# Patient Record
Sex: Female | Born: 1935 | Race: Black or African American | Hispanic: No | Smoking: Never smoker
Health system: Southern US, Community
[De-identification: ages and names within clinical notes are randomized; demographics above are authoritative.]

## PROBLEM LIST (undated history)

## (undated) DIAGNOSIS — E7800 Pure hypercholesterolemia, unspecified: Secondary | ICD-10-CM

## (undated) DIAGNOSIS — M1990 Unspecified osteoarthritis, unspecified site: Secondary | ICD-10-CM

## (undated) DIAGNOSIS — I639 Cerebral infarction, unspecified: Secondary | ICD-10-CM

## (undated) DIAGNOSIS — I10 Essential (primary) hypertension: Secondary | ICD-10-CM

## (undated) DIAGNOSIS — E871 Hypo-osmolality and hyponatremia: Secondary | ICD-10-CM

## (undated) HISTORY — PX: CATARACT EXTRACTION: SUR2

## (undated) HISTORY — PX: HERNIA REPAIR: SHX51

## (undated) HISTORY — PX: ABDOMINAL HERNIA REPAIR: SHX539

## (undated) HISTORY — PX: JOINT REPLACEMENT: SHX530

---

## 1998-04-30 ENCOUNTER — Other Ambulatory Visit: Admission: RE | Admit: 1998-04-30 | Discharge: 1998-04-30 | Payer: Self-pay | Admitting: Family Medicine

## 1998-06-23 ENCOUNTER — Ambulatory Visit (HOSPITAL_COMMUNITY): Admission: RE | Admit: 1998-06-23 | Discharge: 1998-06-24 | Payer: Self-pay

## 2002-01-18 ENCOUNTER — Encounter: Payer: Self-pay | Admitting: Pediatrics

## 2002-01-18 ENCOUNTER — Inpatient Hospital Stay (HOSPITAL_COMMUNITY): Admission: RE | Admit: 2002-01-18 | Discharge: 2002-01-20 | Payer: Self-pay | Admitting: *Deleted

## 2002-01-19 ENCOUNTER — Encounter: Payer: Self-pay | Admitting: Neurology

## 2011-01-09 ENCOUNTER — Emergency Department (HOSPITAL_COMMUNITY)
Admission: EM | Admit: 2011-01-09 | Discharge: 2011-01-09 | Disposition: A | Discharge status: Home / Self Care | Payer: Medicare Other | Attending: Emergency Medicine | Admitting: Emergency Medicine

## 2011-01-09 DIAGNOSIS — I10 Essential (primary) hypertension: Secondary | ICD-10-CM | POA: Insufficient documentation

## 2011-01-09 DIAGNOSIS — H9319 Tinnitus, unspecified ear: Secondary | ICD-10-CM | POA: Insufficient documentation

## 2011-01-09 DIAGNOSIS — E7800 Pure hypercholesterolemia, unspecified: Secondary | ICD-10-CM | POA: Insufficient documentation

## 2014-07-09 ENCOUNTER — Emergency Department (HOSPITAL_COMMUNITY): Payer: Medicare Other

## 2014-07-09 ENCOUNTER — Encounter (HOSPITAL_COMMUNITY): Payer: Self-pay | Admitting: Emergency Medicine

## 2014-07-09 ENCOUNTER — Inpatient Hospital Stay (HOSPITAL_COMMUNITY)
Admission: EM | Admit: 2014-07-09 | Discharge: 2014-07-12 | DRG: 469 | Disposition: A | Discharge status: Home Health | Payer: Medicare Other | Attending: Internal Medicine | Admitting: Internal Medicine

## 2014-07-09 DIAGNOSIS — Z820 Family history of epilepsy and other diseases of the nervous system: Secondary | ICD-10-CM

## 2014-07-09 DIAGNOSIS — D62 Acute posthemorrhagic anemia: Secondary | ICD-10-CM | POA: Diagnosis not present

## 2014-07-09 DIAGNOSIS — S72009A Fracture of unspecified part of neck of unspecified femur, initial encounter for closed fracture: Principal | ICD-10-CM | POA: Diagnosis present

## 2014-07-09 DIAGNOSIS — Z79899 Other long term (current) drug therapy: Secondary | ICD-10-CM

## 2014-07-09 DIAGNOSIS — M25559 Pain in unspecified hip: Secondary | ICD-10-CM | POA: Diagnosis not present

## 2014-07-09 DIAGNOSIS — I10 Essential (primary) hypertension: Secondary | ICD-10-CM | POA: Diagnosis present

## 2014-07-09 DIAGNOSIS — S72001A Fracture of unspecified part of neck of right femur, initial encounter for closed fracture: Secondary | ICD-10-CM

## 2014-07-09 DIAGNOSIS — Y9301 Activity, walking, marching and hiking: Secondary | ICD-10-CM

## 2014-07-09 DIAGNOSIS — Y92009 Unspecified place in unspecified non-institutional (private) residence as the place of occurrence of the external cause: Secondary | ICD-10-CM

## 2014-07-09 DIAGNOSIS — Z7901 Long term (current) use of anticoagulants: Secondary | ICD-10-CM

## 2014-07-09 DIAGNOSIS — E43 Unspecified severe protein-calorie malnutrition: Secondary | ICD-10-CM | POA: Insufficient documentation

## 2014-07-09 DIAGNOSIS — E785 Hyperlipidemia, unspecified: Secondary | ICD-10-CM | POA: Diagnosis present

## 2014-07-09 DIAGNOSIS — Z7982 Long term (current) use of aspirin: Secondary | ICD-10-CM

## 2014-07-09 DIAGNOSIS — W010XXA Fall on same level from slipping, tripping and stumbling without subsequent striking against object, initial encounter: Secondary | ICD-10-CM | POA: Diagnosis present

## 2014-07-09 HISTORY — DX: Essential (primary) hypertension: I10

## 2014-07-09 LAB — CBC WITH DIFFERENTIAL/PLATELET
BASOS PCT: 1 % (ref 0–1)
Basophils Absolute: 0 10*3/uL (ref 0.0–0.1)
Eosinophils Absolute: 0.4 10*3/uL (ref 0.0–0.7)
Eosinophils Relative: 5 % (ref 0–5)
HEMATOCRIT: 35.7 % — AB (ref 36.0–46.0)
HEMOGLOBIN: 11.9 g/dL — AB (ref 12.0–15.0)
LYMPHS ABS: 1.7 10*3/uL (ref 0.7–4.0)
LYMPHS PCT: 19 % (ref 12–46)
MCH: 27.4 pg (ref 26.0–34.0)
MCHC: 33.3 g/dL (ref 30.0–36.0)
MCV: 82.3 fL (ref 78.0–100.0)
MONO ABS: 1 10*3/uL (ref 0.1–1.0)
MONOS PCT: 12 % (ref 3–12)
NEUTROS PCT: 63 % (ref 43–77)
Neutro Abs: 5.7 10*3/uL (ref 1.7–7.7)
Platelets: 378 10*3/uL (ref 150–400)
RBC: 4.34 MIL/uL (ref 3.87–5.11)
RDW: 13.7 % (ref 11.5–15.5)
WBC: 8.8 10*3/uL (ref 4.0–10.5)

## 2014-07-09 LAB — TYPE AND SCREEN
ABO/RH(D): A POS
Antibody Screen: NEGATIVE

## 2014-07-09 LAB — BASIC METABOLIC PANEL
Anion gap: 11 (ref 5–15)
BUN: 7 mg/dL (ref 6–23)
CO2: 27 meq/L (ref 19–32)
CREATININE: 0.6 mg/dL (ref 0.50–1.10)
Calcium: 8.5 mg/dL (ref 8.4–10.5)
Chloride: 97 mEq/L (ref 96–112)
GFR calc Af Amer: 89 mL/min — ABNORMAL LOW (ref >90)
GFR calc non Af Amer: 77 mL/min — ABNORMAL LOW (ref >90)
Glucose, Bld: 114 mg/dL — ABNORMAL HIGH (ref 70–99)
POTASSIUM: 3.7 meq/L (ref 3.7–5.3)
Sodium: 135 mEq/L — ABNORMAL LOW (ref 137–147)

## 2014-07-09 LAB — PROTIME-INR
INR: 1.33 (ref 0.00–1.49)
Prothrombin Time: 16.5 seconds — ABNORMAL HIGH (ref 11.6–15.2)

## 2014-07-09 MED ORDER — FENTANYL CITRATE 0.05 MG/ML IJ SOLN: 50.0000 ug | INTRAMUSCULAR | Status: DC | PRN

## 2014-07-09 NOTE — ED Notes (Signed)
The pt arrived by [facility]  From home where she fell getting out of her car/  Alert oriented skin warm and dry c/o rt hip pain

## 2014-07-09 NOTE — ED Notes (Signed)
The pt is c/o pain in her rt hip with movement.  She lives with her daughter

## 2014-07-09 NOTE — ED Provider Notes (Addendum)
CSN: [id number]     Arrival date & time 07/09/14  2214 History   First MD Initiated Contact with Patient 07/09/14 2216     Chief Complaint  Patient presents with  . Fall     (Consider location/radiation/quality/duration/timing/severity/associated sxs/prior Treatment) Patient is a 80 y.o. female presenting with hip pain. The history is provided by the patient. No language interpreter was used.  Hip Pain This is a new problem. The current episode started 1 to 2 hours ago. The problem occurs constantly. The problem has not changed since onset.Pertinent negatives include no chest pain, no abdominal pain, no headaches and no shortness of breath. The symptoms are aggravated by walking. The symptoms are relieved by rest. She has tried rest for the symptoms. The treatment provided mild relief.    Past Medical History  Diagnosis Date  . Hypertension    History reviewed. No pertinent past surgical history. No family history on file. History  Substance Use Topics  . Smoking status: Never Smoker   . Smokeless tobacco: Not on file  . Alcohol Use: No   OB History   Grav Para Term Preterm Abortions TAB SAB Ect Mult Living                 Review of Systems  Constitutional: Negative for fever, chills, diaphoresis, activity change, appetite change and fatigue.  HENT: Negative for congestion, facial swelling, rhinorrhea and sore throat.   Eyes: Negative for photophobia and discharge.  Respiratory: Negative for cough, chest tightness and shortness of breath.   Cardiovascular: Negative for chest pain, palpitations and leg swelling.  Gastrointestinal: Negative for nausea, vomiting, abdominal pain and diarrhea.  Endocrine: Negative for polydipsia and polyuria.  Genitourinary: Negative for dysuria, frequency, difficulty urinating and pelvic pain.  Musculoskeletal: Negative for arthralgias, back pain, neck pain and neck stiffness.  Skin: Negative for color change and wound.  Allergic/Immunologic:  Negative for immunocompromised state.  Neurological: Negative for facial asymmetry, weakness, numbness and headaches.  Hematological: Does not bruise/bleed easily.  Psychiatric/Behavioral: Negative for confusion and agitation.      Allergies  Review of patient's allergies indicates no known allergies.  Home Medications   Prior to Admission medications   Medication Sig Start Date End Date Taking? Authorizing Provider  aspirin 81 MG chewable tablet Chew 81 mg by mouth daily.   Yes Historical Provider, MD  furosemide (LASIX) 20 MG tablet Take 20 mg by mouth daily.   Yes Historical Provider, MD  hydrALAZINE (APRESOLINE) 50 MG tablet Take 50 mg by mouth daily.   Yes Historical Provider, MD  loratadine (CLARITIN) 10 MG tablet Take 10 mg by mouth daily.   Yes Historical Provider, MD  metoprolol (TOPROL-XL) 200 MG 24 hr tablet Take 200 mg by mouth daily.   Yes Historical Provider, MD  potassium chloride SA (K-DUR,KLOR-CON) 20 MEQ tablet Take 20 mEq by mouth daily.    Yes Historical Provider, MD  pravastatin (PRAVACHOL) 40 MG tablet Take 40 mg by mouth daily.   Yes Historical Provider, MD   BP 198/98  Pulse 78  Temp(Src) 98.1 F (36.7 C) (Oral)  Resp 20  SpO2 98% Physical Exam  Constitutional: She is oriented to person, place, and time. She appears well-developed and well-nourished. No distress.  HENT:  Head: Normocephalic and atraumatic.  Mouth/Throat: No oropharyngeal exudate.  Eyes: Pupils are equal, round, and reactive to light.  Neck: Normal range of motion. Neck supple.  Cardiovascular: Normal rate, regular rhythm and normal heart sounds.  Exam reveals  no gallop and no friction rub.   No murmur heard. Pulmonary/Chest: Effort normal and breath sounds normal. No respiratory distress. She has no wheezes. She has no rales.  Abdominal: Soft. Bowel sounds are normal. She exhibits no distension and no mass. There is no tenderness. There is no rebound and no guarding.  Musculoskeletal:  Normal range of motion. She exhibits no edema.       Right hip: She exhibits tenderness and bony tenderness.  R leg shortened and externally rotated. NVI distally.   Neurological: She is alert and oriented to person, place, and time.  Skin: Skin is warm and dry.  Psychiatric: She has a normal mood and affect.    ED Course  Procedures (including critical care time) Labs Review Labs Reviewed  BASIC METABOLIC PANEL - Abnormal; Notable for the following:    Sodium 135 (*)    Glucose, Bld 114 (*)    GFR calc non Af Amer 77 (*)    GFR calc Af Amer 89 (*)    All other components within normal limits  CBC WITH DIFFERENTIAL - Abnormal; Notable for the following:    Hemoglobin 11.9 (*)    HCT 35.7 (*)    All other components within normal limits  PROTIME-INR - Abnormal; Notable for the following:    Prothrombin Time 16.5 (*)    All other components within normal limits  URINE CULTURE  URINALYSIS, ROUTINE W REFLEX MICROSCOPIC  TYPE AND SCREEN  ABO/RH    Imaging Review Dg Hip Complete Right  07/09/2014   CLINICAL DATA:  Fall, right hip pain.  EXAM: RIGHT HIP - COMPLETE 2+ VIEW  COMPARISON:  None.  FINDINGS: Right femoral neck fracture with impaction, mild varus angulation of the distal bony fragments. Femoral heads are located.  Bone mineral density is decreased without destructive bony lesions. Phleboliths in the pelvis. Periarticular soft tissue planes are nonsuspicious.  IMPRESSION: Impacted mildly angulated right femoral neck fracture without dislocation.   Electronically Signed   By: Awilda Metro   On: 07/09/2014 23:58     EKG Interpretation None      MDM   Final diagnoses:  Femoral neck fracture, right, closed, initial encounter    Pt is a 80 y.o. female with Pmhx as above who presents with R hip pain after mechanical fall this evening. No head injury, no LOC, no syncope.  NVI distally, though R leg is shortened and externally rotated. XR confirms R femoral neck  fracture. Hip fracture protocol initiated. Ortho consulted, plan is for repair tomorrow afternoon or evening. [facility] will admit.         Toy Cookey, MD 07/10/14 1138  Toy Cookey, MD 07/10/14 1141

## 2014-07-10 ENCOUNTER — Inpatient Hospital Stay (HOSPITAL_COMMUNITY): Payer: Medicare Other | Admitting: Anesthesiology

## 2014-07-10 ENCOUNTER — Inpatient Hospital Stay (HOSPITAL_COMMUNITY): Payer: Medicare Other

## 2014-07-10 ENCOUNTER — Encounter (HOSPITAL_COMMUNITY): Payer: Medicare Other | Admitting: Anesthesiology

## 2014-07-10 ENCOUNTER — Encounter (HOSPITAL_COMMUNITY): Payer: Self-pay | Admitting: *Deleted

## 2014-07-10 ENCOUNTER — Encounter (HOSPITAL_COMMUNITY)
Admission: EM | Disposition: A | Discharge status: Home Health | Payer: Self-pay | Source: Home / Self Care | Attending: Internal Medicine

## 2014-07-10 DIAGNOSIS — I10 Essential (primary) hypertension: Secondary | ICD-10-CM | POA: Diagnosis present

## 2014-07-10 DIAGNOSIS — D62 Acute posthemorrhagic anemia: Secondary | ICD-10-CM | POA: Diagnosis not present

## 2014-07-10 DIAGNOSIS — Y9301 Activity, walking, marching and hiking: Secondary | ICD-10-CM | POA: Diagnosis not present

## 2014-07-10 DIAGNOSIS — Y92009 Unspecified place in unspecified non-institutional (private) residence as the place of occurrence of the external cause: Secondary | ICD-10-CM | POA: Diagnosis not present

## 2014-07-10 DIAGNOSIS — S72001A Fracture of unspecified part of neck of right femur, initial encounter for closed fracture: Secondary | ICD-10-CM | POA: Diagnosis present

## 2014-07-10 DIAGNOSIS — S72009A Fracture of unspecified part of neck of unspecified femur, initial encounter for closed fracture: Principal | ICD-10-CM

## 2014-07-10 DIAGNOSIS — Z7982 Long term (current) use of aspirin: Secondary | ICD-10-CM | POA: Diagnosis not present

## 2014-07-10 DIAGNOSIS — Z7901 Long term (current) use of anticoagulants: Secondary | ICD-10-CM | POA: Diagnosis not present

## 2014-07-10 DIAGNOSIS — E785 Hyperlipidemia, unspecified: Secondary | ICD-10-CM

## 2014-07-10 DIAGNOSIS — Z79899 Other long term (current) drug therapy: Secondary | ICD-10-CM | POA: Diagnosis not present

## 2014-07-10 DIAGNOSIS — W010XXA Fall on same level from slipping, tripping and stumbling without subsequent striking against object, initial encounter: Secondary | ICD-10-CM | POA: Diagnosis present

## 2014-07-10 DIAGNOSIS — E43 Unspecified severe protein-calorie malnutrition: Secondary | ICD-10-CM | POA: Diagnosis present

## 2014-07-10 DIAGNOSIS — Z820 Family history of epilepsy and other diseases of the nervous system: Secondary | ICD-10-CM | POA: Diagnosis not present

## 2014-07-10 DIAGNOSIS — M25559 Pain in unspecified hip: Secondary | ICD-10-CM | POA: Diagnosis present

## 2014-07-10 HISTORY — PX: HIP ARTHROPLASTY: SHX981

## 2014-07-10 LAB — CBC WITH DIFFERENTIAL/PLATELET
Basophils Absolute: 0 10*3/uL (ref 0.0–0.1)
Basophils Relative: 0 % (ref 0–1)
EOS ABS: 0 10*3/uL (ref 0.0–0.7)
Eosinophils Relative: 0 % (ref 0–5)
HCT: 37.7 % (ref 36.0–46.0)
Hemoglobin: 12.4 g/dL (ref 12.0–15.0)
LYMPHS ABS: 1.2 10*3/uL (ref 0.7–4.0)
LYMPHS PCT: 11 % — AB (ref 12–46)
MCH: 27.6 pg (ref 26.0–34.0)
MCHC: 32.9 g/dL (ref 30.0–36.0)
MCV: 84 fL (ref 78.0–100.0)
Monocytes Absolute: 0.7 10*3/uL (ref 0.1–1.0)
Monocytes Relative: 6 % (ref 3–12)
NEUTROS PCT: 83 % — AB (ref 43–77)
Neutro Abs: 8.6 10*3/uL — ABNORMAL HIGH (ref 1.7–7.7)
Platelets: 390 10*3/uL (ref 150–400)
RBC: 4.49 MIL/uL (ref 3.87–5.11)
RDW: 13.8 % (ref 11.5–15.5)
WBC: 10.5 10*3/uL (ref 4.0–10.5)

## 2014-07-10 LAB — URINALYSIS, ROUTINE W REFLEX MICROSCOPIC
BILIRUBIN URINE: NEGATIVE
Glucose, UA: NEGATIVE mg/dL
Hgb urine dipstick: NEGATIVE
KETONES UR: NEGATIVE mg/dL
Leukocytes, UA: NEGATIVE
Nitrite: NEGATIVE
PH: 8 (ref 5.0–8.0)
Protein, ur: NEGATIVE mg/dL
Specific Gravity, Urine: 1.01 (ref 1.005–1.030)
Urobilinogen, UA: 0.2 mg/dL (ref 0.0–1.0)

## 2014-07-10 LAB — COMPREHENSIVE METABOLIC PANEL
ALT: 6 U/L (ref 0–35)
AST: 18 U/L (ref 0–37)
Albumin: 2.5 g/dL — ABNORMAL LOW (ref 3.5–5.2)
Alkaline Phosphatase: 85 U/L (ref 39–117)
Anion gap: 13 (ref 5–15)
BUN: 7 mg/dL (ref 6–23)
CALCIUM: 8.8 mg/dL (ref 8.4–10.5)
CO2: 27 meq/L (ref 19–32)
Chloride: 97 mEq/L (ref 96–112)
Creatinine, Ser: 0.52 mg/dL (ref 0.50–1.10)
GFR, EST NON AFRICAN AMERICAN: 80 mL/min — AB (ref >90)
GLUCOSE: 126 mg/dL — AB (ref 70–99)
Potassium: 3.7 mEq/L (ref 3.7–5.3)
Sodium: 137 mEq/L (ref 137–147)
TOTAL PROTEIN: 7.5 g/dL (ref 6.0–8.3)
Total Bilirubin: 0.8 mg/dL (ref 0.3–1.2)

## 2014-07-10 LAB — ABO/RH: ABO/RH(D): A POS

## 2014-07-10 LAB — SURGICAL PCR SCREEN
MRSA, PCR: NEGATIVE
Staphylococcus aureus: NEGATIVE

## 2014-07-10 SURGERY — HEMIARTHROPLASTY, HIP, DIRECT ANTERIOR APPROACH, FOR FRACTURE
Anesthesia: General | Site: Hip | Laterality: Right

## 2014-07-10 MED ORDER — HYDROMORPHONE HCL PF 1 MG/ML IJ SOLN: 0.2500 mg | INTRAMUSCULAR | Status: DC | PRN

## 2014-07-10 MED ORDER — SODIUM CHLORIDE 0.9 % IV SOLN
INTRAVENOUS | Status: DC
  Administered 2014-07-10: 21:00:00 via INTRAVENOUS
  Administered 2014-07-11: 125 mL/h via INTRAVENOUS

## 2014-07-10 MED ORDER — METOCLOPRAMIDE HCL 10 MG PO TABS: 5.0000 mg | ORAL_TABLET | Freq: Three times a day (TID) | ORAL | Status: DC | PRN

## 2014-07-10 MED ORDER — MENTHOL 3 MG MT LOZG: 1.0000 | LOZENGE | OROMUCOSAL | Status: DC | PRN

## 2014-07-10 MED ORDER — HYDROCODONE-ACETAMINOPHEN 5-325 MG PO TABS: 1.0000 | ORAL_TABLET | Freq: Four times a day (QID) | ORAL | Status: DC | PRN

## 2014-07-10 MED ORDER — MIDAZOLAM HCL 2 MG/2ML IJ SOLN
INTRAMUSCULAR | Status: AC
  Filled 2014-07-10: qty 2

## 2014-07-10 MED ORDER — PROMETHAZINE HCL 25 MG/ML IJ SOLN: 6.2500 mg | INTRAMUSCULAR | Status: DC | PRN

## 2014-07-10 MED ORDER — FENTANYL CITRATE 0.05 MG/ML IJ SOLN
INTRAMUSCULAR | Status: AC
  Filled 2014-07-10: qty 5

## 2014-07-10 MED ORDER — SODIUM CHLORIDE 0.9 % IR SOLN
Status: DC | PRN
  Administered 2014-07-10: 1000 mL

## 2014-07-10 MED ORDER — SODIUM CHLORIDE 0.9 % IV SOLN
INTRAVENOUS | Status: DC
  Administered 2014-07-10: 10 mL via INTRAVENOUS

## 2014-07-10 MED ORDER — SODIUM CHLORIDE 0.9 % IR SOLN
Status: DC | PRN
  Administered 2014-07-10: 3000 mL

## 2014-07-10 MED ORDER — METOCLOPRAMIDE HCL 5 MG/ML IJ SOLN: 5.0000 mg | Freq: Three times a day (TID) | INTRAMUSCULAR | Status: DC | PRN

## 2014-07-10 MED ORDER — ACETAMINOPHEN 650 MG RE SUPP: 650.0000 mg | Freq: Four times a day (QID) | RECTAL | Status: DC | PRN

## 2014-07-10 MED ORDER — ACETAMINOPHEN 325 MG PO TABS
650.0000 mg | ORAL_TABLET | Freq: Four times a day (QID) | ORAL | Status: DC | PRN
  Administered 2014-07-12: 650 mg via ORAL
  Filled 2014-07-10: qty 2

## 2014-07-10 MED ORDER — MAGNESIUM CITRATE PO SOLN: 1.0000 | Freq: Once | ORAL | Status: AC | PRN

## 2014-07-10 MED ORDER — ENOXAPARIN SODIUM 40 MG/0.4ML ~~LOC~~ SOLN
40.0000 mg | SUBCUTANEOUS | Status: DC
  Administered 2014-07-11 – 2014-07-12 (×2): 40 mg via SUBCUTANEOUS
  Filled 2014-07-10 (×4): qty 0.4

## 2014-07-10 MED ORDER — MORPHINE SULFATE 2 MG/ML IJ SOLN
0.5000 mg | INTRAMUSCULAR | Status: DC | PRN
Start: 2014-07-10 — End: 2014-07-12
  Administered 2014-07-10 – 2014-07-11 (×4): 0.5 mg via INTRAVENOUS
  Filled 2014-07-10 (×5): qty 1

## 2014-07-10 MED ORDER — PHENYLEPHRINE HCL 10 MG/ML IJ SOLN
INTRAMUSCULAR | Status: AC
Start: 2014-07-10 — End: 2014-07-10
  Filled 2014-07-10: qty 1

## 2014-07-10 MED ORDER — POLYETHYLENE GLYCOL 3350 17 G PO PACK: 17.0000 g | PACK | Freq: Every day | ORAL | Status: DC | PRN

## 2014-07-10 MED ORDER — BUPIVACAINE HCL (PF) 0.5 % IJ SOLN
INTRAMUSCULAR | Status: DC | PRN
  Administered 2014-07-10: 2.5 mL

## 2014-07-10 MED ORDER — LIDOCAINE HCL (CARDIAC) 20 MG/ML IV SOLN
INTRAVENOUS | Status: AC
  Filled 2014-07-10: qty 5

## 2014-07-10 MED ORDER — FENTANYL CITRATE 0.05 MG/ML IJ SOLN: 25.0000 ug | INTRAMUSCULAR | Status: DC | PRN

## 2014-07-10 MED ORDER — HYDROMORPHONE HCL PF 1 MG/ML IJ SOLN
0.5000 mg | Freq: Once | INTRAMUSCULAR | Status: AC
  Administered 2014-07-10: 0.5 mg via INTRAVENOUS
  Filled 2014-07-10: qty 1

## 2014-07-10 MED ORDER — DEXTROSE 5 % IV SOLN
INTRAVENOUS | Status: DC | PRN
  Administered 2014-07-10: 15:00:00 via INTRAVENOUS

## 2014-07-10 MED ORDER — METHOCARBAMOL 500 MG PO TABS: 500.0000 mg | ORAL_TABLET | Freq: Four times a day (QID) | ORAL | Status: DC | PRN

## 2014-07-10 MED ORDER — PNEUMOCOCCAL VAC POLYVALENT 25 MCG/0.5ML IJ INJ
0.5000 mL | INJECTION | INTRAMUSCULAR | Status: DC
  Filled 2014-07-10: qty 0.5

## 2014-07-10 MED ORDER — HYDROCODONE-ACETAMINOPHEN 5-325 MG PO TABS
1.0000 | ORAL_TABLET | Freq: Four times a day (QID) | ORAL | Status: DC | PRN
  Filled 2014-07-10 (×3): qty 1

## 2014-07-10 MED ORDER — SODIUM CHLORIDE 0.9 % IV SOLN: INTRAVENOUS | Status: DC

## 2014-07-10 MED ORDER — PROPOFOL 10 MG/ML IV BOLUS
INTRAVENOUS | Status: DC | PRN
  Administered 2014-07-10: 40 mg via INTRAVENOUS

## 2014-07-10 MED ORDER — METOPROLOL SUCCINATE ER 100 MG PO TB24
100.0000 mg | ORAL_TABLET | Freq: Every day | ORAL | Status: DC
  Administered 2014-07-11 – 2014-07-12 (×2): 100 mg via ORAL
  Filled 2014-07-10 (×3): qty 1

## 2014-07-10 MED ORDER — CEFAZOLIN SODIUM-DEXTROSE 2-3 GM-% IV SOLR
2.0000 g | INTRAVENOUS | Status: AC
  Administered 2014-07-10: 2 g via INTRAVENOUS
  Filled 2014-07-10: qty 50

## 2014-07-10 MED ORDER — HYDRALAZINE HCL 20 MG/ML IJ SOLN
10.0000 mg | INTRAMUSCULAR | Status: DC | PRN
  Administered 2014-07-10: 10 mg via INTRAVENOUS
  Filled 2014-07-10 (×2): qty 1

## 2014-07-10 MED ORDER — PROPOFOL 10 MG/ML IV BOLUS
INTRAVENOUS | Status: AC
  Filled 2014-07-10: qty 20

## 2014-07-10 MED ORDER — CHLORHEXIDINE GLUCONATE 4 % EX LIQD
60.0000 mL | Freq: Once | CUTANEOUS | Status: AC
  Administered 2014-07-10: 4 via TOPICAL
  Filled 2014-07-10: qty 60

## 2014-07-10 MED ORDER — PROMETHAZINE HCL 25 MG PO TABS: 25.0000 mg | ORAL_TABLET | Freq: Four times a day (QID) | ORAL | Status: DC | PRN

## 2014-07-10 MED ORDER — ONDANSETRON HCL 4 MG PO TABS: 4.0000 mg | ORAL_TABLET | Freq: Four times a day (QID) | ORAL | Status: DC | PRN

## 2014-07-10 MED ORDER — ENOXAPARIN SODIUM 40 MG/0.4ML ~~LOC~~ SOLN: 40.0000 mg | Freq: Every day | SUBCUTANEOUS | Status: DC

## 2014-07-10 MED ORDER — LACTATED RINGERS IV SOLN
INTRAVENOUS | Status: DC
  Administered 2014-07-10: 13:00:00 via INTRAVENOUS

## 2014-07-10 MED ORDER — PHENYLEPHRINE 40 MCG/ML (10ML) SYRINGE FOR IV PUSH (FOR BLOOD PRESSURE SUPPORT)
PREFILLED_SYRINGE | INTRAVENOUS | Status: AC
  Filled 2014-07-10: qty 10

## 2014-07-10 MED ORDER — METHOCARBAMOL 1000 MG/10ML IJ SOLN
500.0000 mg | Freq: Four times a day (QID) | INTRAVENOUS | Status: DC | PRN
  Filled 2014-07-10: qty 5

## 2014-07-10 MED ORDER — SORBITOL 70 % SOLN
30.0000 mL | Freq: Every day | Status: DC | PRN
Start: 2014-07-10 — End: 2014-07-12

## 2014-07-10 MED ORDER — HYDROCODONE-ACETAMINOPHEN 5-325 MG PO TABS
1.0000 | ORAL_TABLET | Freq: Four times a day (QID) | ORAL | Status: DC | PRN
  Administered 2014-07-11 – 2014-07-12 (×4): 1 via ORAL
  Filled 2014-07-10 (×2): qty 1

## 2014-07-10 MED ORDER — SENNOSIDES-DOCUSATE SODIUM 8.6-50 MG PO TABS: 1.0000 | ORAL_TABLET | Freq: Every evening | ORAL | Status: DC | PRN

## 2014-07-10 MED ORDER — PHENYLEPHRINE HCL 10 MG/ML IJ SOLN
INTRAMUSCULAR | Status: DC | PRN
  Administered 2014-07-10: 80 ug via INTRAVENOUS
  Administered 2014-07-10 (×2): 40 ug via INTRAVENOUS
  Administered 2014-07-10 (×3): 80 ug via INTRAVENOUS

## 2014-07-10 MED ORDER — OXYCODONE HCL 5 MG PO TABS: 5.0000 mg | ORAL_TABLET | Freq: Once | ORAL | Status: DC | PRN

## 2014-07-10 MED ORDER — ONDANSETRON HCL 4 MG/2ML IJ SOLN: 4.0000 mg | Freq: Once | INTRAMUSCULAR | Status: DC | PRN

## 2014-07-10 MED ORDER — OXYCODONE HCL 5 MG/5ML PO SOLN: 5.0000 mg | Freq: Once | ORAL | Status: DC | PRN

## 2014-07-10 MED ORDER — CEFAZOLIN SODIUM-DEXTROSE 2-3 GM-% IV SOLR
2.0000 g | Freq: Four times a day (QID) | INTRAVENOUS | Status: AC
  Administered 2014-07-10 – 2014-07-11 (×3): 2 g via INTRAVENOUS
  Filled 2014-07-10 (×3): qty 50

## 2014-07-10 MED ORDER — FENTANYL CITRATE 0.05 MG/ML IJ SOLN
INTRAMUSCULAR | Status: DC | PRN
Start: 2014-07-10 — End: 2014-07-10
  Administered 2014-07-10: 50 ug via INTRAVENOUS

## 2014-07-10 MED ORDER — OXYCODONE HCL 5 MG PO TABS: 5.0000 mg | ORAL_TABLET | ORAL | Status: DC | PRN

## 2014-07-10 MED ORDER — MORPHINE SULFATE 2 MG/ML IJ SOLN
0.5000 mg | INTRAMUSCULAR | Status: DC | PRN
  Administered 2014-07-11 (×2): 0.5 mg via INTRAVENOUS
  Filled 2014-07-10: qty 1

## 2014-07-10 MED ORDER — METOPROLOL TARTRATE 1 MG/ML IV SOLN
5.0000 mg | Freq: Four times a day (QID) | INTRAVENOUS | Status: DC
  Administered 2014-07-10 – 2014-07-11 (×4): 5 mg via INTRAVENOUS
  Filled 2014-07-10 (×11): qty 5

## 2014-07-10 MED ORDER — FUROSEMIDE 20 MG PO TABS
20.0000 mg | ORAL_TABLET | Freq: Every day | ORAL | Status: DC
  Administered 2014-07-11 – 2014-07-12 (×2): 20 mg via ORAL
  Filled 2014-07-10 (×3): qty 1

## 2014-07-10 MED ORDER — PHENOL 1.4 % MT LIQD: 1.0000 | OROMUCOSAL | Status: DC | PRN

## 2014-07-10 MED ORDER — SENNA 8.6 MG PO TABS
1.0000 | ORAL_TABLET | Freq: Two times a day (BID) | ORAL | Status: DC
  Administered 2014-07-11 – 2014-07-12 (×3): 8.6 mg via ORAL
  Filled 2014-07-10 (×5): qty 1

## 2014-07-10 MED ORDER — CALCIUM CARBONATE-VITAMIN D 500-200 MG-UNIT PO TABS: 1.0000 | ORAL_TABLET | Freq: Every day | ORAL | Status: DC

## 2014-07-10 MED ORDER — METOPROLOL SUCCINATE ER 100 MG PO TB24: 100.0000 mg | ORAL_TABLET | Freq: Every day | ORAL | Status: DC

## 2014-07-10 MED ORDER — MEPERIDINE HCL 25 MG/ML IJ SOLN: 6.2500 mg | INTRAMUSCULAR | Status: DC | PRN

## 2014-07-10 MED ORDER — ALUM & MAG HYDROXIDE-SIMETH 200-200-20 MG/5ML PO SUSP: 30.0000 mL | ORAL | Status: DC | PRN

## 2014-07-10 MED ORDER — LACTATED RINGERS IV SOLN
INTRAVENOUS | Status: DC | PRN
  Administered 2014-07-10 (×2): via INTRAVENOUS

## 2014-07-10 MED ORDER — ONDANSETRON HCL 4 MG/2ML IJ SOLN: 4.0000 mg | Freq: Four times a day (QID) | INTRAMUSCULAR | Status: DC | PRN

## 2014-07-10 SURGICAL SUPPLY — 42 items
BLADE SAGITTAL (BLADE) ×3
BLADE SAW THK.89X75X18XSGTL (BLADE) ×1 IMPLANT
CEMENT ODC PLUS STEM (Cement) ×2 IMPLANT
COVER SURGICAL LIGHT HANDLE (MISCELLANEOUS) ×3 IMPLANT
DRAPE HIP W/POCKET STRL (DRAPE) ×3 IMPLANT
DRAPE INCISE IOBAN 85X60 (DRAPES) ×3 IMPLANT
DRAPE U-SHAPE 47X51 STRL (DRAPES) ×3 IMPLANT
DRSG AQUACEL AG ADV 3.5X10 (GAUZE/BANDAGES/DRESSINGS) ×2 IMPLANT
DRSG MEPILEX BORDER 4X8 (GAUZE/BANDAGES/DRESSINGS) IMPLANT
DURAPREP 26ML APPLICATOR (WOUND CARE) ×6 IMPLANT
ELECT CAUTERY BLADE 6.4 (BLADE) ×3 IMPLANT
ELECT REM PT RETURN 9FT ADLT (ELECTROSURGICAL) ×3
ELECTRODE REM PT RTRN 9FT ADLT (ELECTROSURGICAL) ×1 IMPLANT
EVACUATOR 1/8 PVC DRAIN (DRAIN) IMPLANT
FACESHIELD WRAPAROUND (MASK) ×6 IMPLANT
FACESHIELD WRAPAROUND OR TEAM (MASK) IMPLANT
GLOVE SURG SYN 7.5  E (GLOVE) ×4
GLOVE SURG SYN 7.5 E (GLOVE) ×2 IMPLANT
GLOVE SURG SYN 7.5 PF PI (GLOVE) ×2 IMPLANT
GOWN STRL REUS W/ TWL LRG LVL3 (GOWN DISPOSABLE) ×1 IMPLANT
GOWN STRL REUS W/TWL LRG LVL3 (GOWN DISPOSABLE) ×3
HIP HEMIARTHROPLASTY LEV 1B ×2 IMPLANT
KIT BASIN OR (CUSTOM PROCEDURE TRAY) ×3 IMPLANT
KIT ROOM TURNOVER OR (KITS) ×3 IMPLANT
MANIFOLD NEPTUNE II (INSTRUMENTS) ×3 IMPLANT
NS IRRIG 1000ML POUR BTL (IV SOLUTION) ×3 IMPLANT
PACK SHOULDER (CUSTOM PROCEDURE TRAY) ×3 IMPLANT
PAD ARMBOARD 7.5X6 YLW CONV (MISCELLANEOUS) ×6 IMPLANT
SPONGE LAP 18X18 X RAY DECT (DISPOSABLE) ×4 IMPLANT
STAPLER VISISTAT 35W (STAPLE) IMPLANT
SUT ETHIBOND 2 V 37 (SUTURE) ×3 IMPLANT
SUT ETHIBOND CT1 BRD #0 30IN (SUTURE) ×2 IMPLANT
SUT ETHIBOND NAB CT1 #1 30IN (SUTURE) ×2 IMPLANT
SUT PDS AB 1 CT  36 (SUTURE) ×6
SUT PDS AB 1 CT 36 (SUTURE) ×2 IMPLANT
SUT VIC AB 0 CT1 27 (SUTURE)
SUT VIC AB 0 CT1 27XBRD ANBCTR (SUTURE) IMPLANT
SUT VIC AB 1 CTB1 27 (SUTURE) IMPLANT
SUT VIC AB 2-0 CT1 27 (SUTURE)
SUT VIC AB 2-0 CT1 TAPERPNT 27 (SUTURE) IMPLANT
TOWEL OR 17X24 6PK STRL BLUE (TOWEL DISPOSABLE) ×3 IMPLANT
TOWEL OR 17X26 10 PK STRL BLUE (TOWEL DISPOSABLE) ×3 IMPLANT

## 2014-07-10 NOTE — H&P (Signed)
[facility] History and Physical  [name] Fry ZOX:[id number] DOB: 03/05/1920 DOA: 07/09/2014  Referring physician: ER physician. PCP: [name],[name], MD  Chief Complaint: Fall.  HPI: [name] Fry is a 80 y.o. female with history of hypertension and hyperlipidemia had a fall while patient was walking with her daughter outside the house. Patient states she stumbled and fell. Denies any chest pain shortness of breath palpitations. Patient did not hit her head or lose consciousness. Patient has been having some right hip pain since fall and was brought to the ER where x-rays revealed right hip fracture. On-call surgeon for orthopedics Dr. Magnus IvanBlackman was consulted. Patient has been admitted for further workup.   Review of Systems: As presented in the history of presenting illness, rest negative.  Past Medical History  Diagnosis Date  . Hypertension    Past Surgical History  Procedure Laterality Date  . Hernia repair     Social History:  reports that she has never smoked. She does not have any smokeless tobacco history on file. She reports that she does not drink alcohol or use illicit drugs. Where does patient live home. Can patient participate in ADLs? Yes.  No Known Allergies  Family History:  Family History  Problem Relation Age of Onset  . CAD Neg Hx   . Dementia Other       Prior to Admission medications   Medication Sig Start Date End Date Taking? Authorizing Provider  aspirin 81 MG chewable tablet Chew 81 mg by mouth daily.   Yes Historical Provider, MD  furosemide (LASIX) 20 MG tablet Take 20 mg by mouth daily.   Yes Historical Provider, MD  hydrALAZINE (APRESOLINE) 50 MG tablet Take 50 mg by mouth daily.   Yes Historical Provider, MD  loratadine (CLARITIN) 10 MG tablet Take 10 mg by mouth daily.   Yes Historical Provider, MD  metoprolol (TOPROL-XL) 200 MG 24 hr tablet Take 200 mg by mouth daily.   Yes Historical Provider, MD  potassium chloride SA (K-DUR,KLOR-CON)  20 MEQ tablet Take 20 mEq by mouth daily.    Yes Historical Provider, MD  pravastatin (PRAVACHOL) 40 MG tablet Take 40 mg by mouth daily.   Yes Historical Provider, MD    Physical Exam: Filed Vitals:   07/09/14 2345 07/10/14 0030 07/10/14 0108 07/10/14 0149  BP: 198/98 178/102 173/84 195/90  Pulse: 78 69 69 71  Temp:    97.7 F (36.5 C)  TempSrc:    Oral  Resp: 20   18  Weight:    43.954 kg (96 lb 14.4 oz)  SpO2: 98% 94% 97% 98%     General:  Moderately built and nourished.  Eyes: Anicteric no pallor.  ENT: No discharge from ears eyes nose mouth.  Neck: No mass felt.  Cardiovascular: S1-S2 heard.  Respiratory: No rhonchi or crepitations.  Abdomen: Soft nontender bowel sounds present. No guarding no rigidity.  Skin: No rash.  Musculoskeletal: No edema. Pain on moving her right hip.  Psychiatric: Appears normal.  Neurologic: Alert awake oriented to time place and person. Moves all extremities.  Labs on Admission:  Basic Metabolic Panel:  Recent Labs Lab 07/09/14 2257  NA 135*  K 3.7  CL 97  CO2 27  GLUCOSE 114*  BUN 7  CREATININE 0.60  CALCIUM 8.5   Liver Function Tests: No results found for this basename: AST, ALT, ALKPHOS, BILITOT, PROT, ALBUMIN,  in the last 168 hours No results found for this basename: LIPASE, AMYLASE,  in the last 168  hours No results found for this basename: AMMONIA,  in the last 168 hours CBC:  Recent Labs Lab 07/09/14 2257  WBC 8.8  NEUTROABS 5.7  HGB 11.9*  HCT 35.7*  MCV 82.3  PLT 378   Cardiac Enzymes: No results found for this basename: CKTOTAL, CKMB, CKMBINDEX, TROPONINI,  in the last 168 hours  BNP (last 3 results) No results found for this basename: PROBNP,  in the last 8760 hours CBG: No results found for this basename: GLUCAP,  in the last 168 hours  Radiological Exams on Admission: Dg Hip Complete Right  07/09/2014   CLINICAL DATA:  Fall, right hip pain.  EXAM: RIGHT HIP - COMPLETE 2+ VIEW  COMPARISON:   None.  FINDINGS: Right femoral neck fracture with impaction, mild varus angulation of the distal bony fragments. Femoral heads are located.  Bone mineral density is decreased without destructive bony lesions. Phleboliths in the pelvis. Periarticular soft tissue planes are nonsuspicious.  IMPRESSION: Impacted mildly angulated right femoral neck fracture without dislocation.   Electronically Signed   By: Awilda Metro   On: 07/09/2014 23:58   Dg Chest Port 1 View  07/10/2014   CLINICAL DATA:  Check infiltrates.  EXAM: PORTABLE CHEST - 1 VIEW  COMPARISON:  None.  FINDINGS: There is a moderate right pleural effusion which is layering. This obscures the right lower lobe which could contain atelectasis, pneumonia, or even mass. Mild cardiomegaly. Negative aortic contours. No pulmonary edema. No pneumothorax.  IMPRESSION: Moderate right pleural effusion which obscures the right lower lobe. Follow-up PA and lateral chest radiography recommended after convalescence from hip fracture.   Electronically Signed   By: Tiburcio Pea M.D.   On: 07/10/2014 01:05    EKG: Independently reviewed. Normal sinus rhythm with RBBB and nonspecific repolarization changes in the lateral leads.  Assessment/Plan Principal Problem:   Femoral neck fracture Active Problems:   HTN (hypertension)   HLD (hyperlipidemia)   Closed right hip fracture   1. Right hip fracture status post mechanical fall - medical standpoint of view patient looks stable for surgery. Patient will be kept n.p.o. past morning in anticipation of surgery which is planned to be done later in the day. Patient will be placed on pain relief medications. 2. Hypertension - since patient will be n.p.o. I have placed patient on scheduled IV metoprolol 5 mg every 6 hourly with when necessary IV hydralazine. 3. Hyperlipidemia - continue statins and patient can take orally. 4. Mild anemia - follow CBC.    Code Status: Full code.  Family Communication: Family  at the bedside.  Disposition Plan: Admit to inpatient.    Kelci Petrella N. [facility] Pager [phone].  If 7PM-7AM, please contact night-coverage [email or web address] Password [facility] 07/10/2014, 3:19 AM

## 2014-07-10 NOTE — Discharge Instructions (Signed)
1. Change dressing on 07/17/2014. 2. Then change dressing as needed. 3. Do not get incision wet for 2 weeks. 4. Take lovenox to prevent blood clots.

## 2014-07-10 NOTE — Progress Notes (Signed)
INITIAL NUTRITION ASSESSMENT  Pt meets criteria for SEVERE MALNUTRITION in the context of chronic illness as evidenced by severe fat and muscle mass depletion.  DOCUMENTATION CODES Per approved criteria  -Severe malnutrition in the context of chronic illness   INTERVENTION: -Recommend obtaining pt height to assess body mass index.  -Once diet is advanced, will order oral supplements for pt.   NUTRITION DIAGNOSIS: Increased nutrient needs related to s/p surgery as evidenced by estimated nutrition needs.   Goal: Pt to meet >/= 90% of their estimated nutrition needs   Monitor:  Diet advancement, weight trends, labs, I/O's  Reason for Assessment: MD Consult  80 y.o. female  Admitting Dx: Femoral neck fracture  ASSESSMENT: Pt with PMH of hypertension and hyperlipidemia had a fall while patient was walking with her daughter outside the house.   Pt is NPO for surgery today. Pt reports she has a good appetite currently and prior to admission back at home, eating 3 full meals a day. Pt reports her usual body weight of around 100 lbs. Noted there is no height charted in Epic. Pt reports she does not know her height, but reports she is "5 feet something".Pt denies any nausea, stomach pains, or difficulties chewing/swallowing. Pt is willing to try oral supplements once diet is advanced to help increase her calorie and protein intake. Will order once diet is advanced.  Nutrition Focused Physical Exam:  Subcutaneous Fat:  Orbital Region: N/A Upper Arm Region: WNL (noted decreased muscle mass) Thoracic and Lumbar Region: Severe depletion  Muscle:  Temple Region: Moderate depletion Clavicle Bone Region: Severe depletion Clavicle and Acromion Bone Region: Severe depletion Scapular Bone Region: N/A Dorsal Hand: Severe depletion Patellar Region: Moderate depletion Anterior Thigh Region: Moderate depletion Posterior Calf Region: Moderate depletion  Edema: non-pitting LE  Height: Ht  Readings from Last 1 Encounters:  No data found for Ht    Weight: Wt Readings from Last 1 Encounters:  07/10/14 96 lb 14.4 oz (43.954 kg)    Ideal Body Weight: unable to assess  % Ideal Body Weight: unable to assess  Wt Readings from Last 10 Encounters:  07/10/14 96 lb 14.4 oz (43.954 kg)  07/10/14 96 lb 14.4 oz (43.954 kg)   Usual Body Weight: 100 lbs  % Usual Body Weight: 96%  BMI:  There is no height on file to calculate BMI.  Estimated Nutritional Needs: Kcal: 1500-1700 Protein: 60-75 grams Fluid: 1.5 L - 1.7 L/day  Skin: Non-pitting LE edema  Diet Order: NPO  EDUCATION NEEDS: -No education needs identified at this time   Intake/Output Summary (Last 24 hours) at 07/10/14 0900 Last data filed at 07/10/14 0700  Gross per 24 hour  Intake     30 ml  Output    475 ml  Net   -445 ml    Last BM: 8/19  Labs:   Recent Labs Lab 07/09/14 2257 07/10/14 0720  NA 135* 137  K 3.7 3.7  CL 97 97  CO2 27 27  BUN 7 7  CREATININE 0.60 0.52  CALCIUM 8.5 8.8  GLUCOSE 114* 126*    CBG (last 3)  No results found for this basename: GLUCAP,  in the last 72 hours  Scheduled Meds: .  ceFAZolin (ANCEF) IV  2 g Intravenous On Call to OR  . chlorhexidine  60 mL Topical Once  . metoprolol  5 mg Intravenous 4 times per day  . [START ON 07/11/2014] pneumococcal 23 valent vaccine  0.5 mL Intramuscular Tomorrow-1000  Continuous Infusions: . sodium chloride 10 mL (07/10/14 0400)  . sodium chloride 125 mL/hr at 07/10/14 0830    Past Medical History  Diagnosis Date  . Hypertension     Past Surgical History  Procedure Laterality Date  . Hernia repair      Marijean Niemann, MS, Provisional LDN Pager # [phone] After hours/ weekend pager # [phone]

## 2014-07-10 NOTE — ED Notes (Signed)
MD at bedside. 

## 2014-07-10 NOTE — ED Notes (Signed)
Repositioned patient and placed a pillow under right side to help relieve pain to right hip

## 2014-07-10 NOTE — Anesthesia Procedure Notes (Signed)
Spinal  Patient location during procedure: OR Staffing Anesthesiologist: Nolon Nations R Performed by: anesthesiologist  Preanesthetic Checklist Completed: patient identified, site marked, surgical consent, pre-op evaluation, timeout performed, IV checked, risks and benefits discussed and monitors and equipment checked Spinal Block Patient position: sitting Prep: ChloraPrep Patient monitoring: heart rate, continuous pulse ox and blood pressure Approach: left paramedian Injection technique: single-shot Needle Needle type: Quincke  Needle gauge: 22 G Needle length: 9 cm Assessment Sensory level: T8 Additional Notes Expiration date of kit checked and confirmed. Patient tolerated procedure well, without complications.

## 2014-07-10 NOTE — Op Note (Signed)
ARTHROPLASTY BIPOLAR HIP  Jefm Bryantlice Duca   [id number]  Pre-op Diagnosis: right fem neck fracture     Post-op Diagnosis: same   Operative Procedures  1. Open treatment of femoral fracture, proximal end, neck, prosthetic replacement CPT [phone]  Personnel  Surgeon(s): Calden Dorsey Glee ArvinMichael Michaelpaul Apo, MD   Anesthesia: spinal  Prosthesis: Stryker Femur: Omnifix Hfx Head: 45 size: +5 Bearing Type: Monopolar  Date of Service: 07/10/2014  Indication: 80 y.o.. year old female who suffered a ground level fall and was found to have sustained a Right hip fracture. The patient was found to have a hip fracture that was appropriate for operative management. We reviewed the risk and benefits with the patient and family and they elected to proceed.  Procedure: After informed consent was obtained and understanding of the risk were voiced including but not limited to bleeding, infection, damage to surrounding structures including nerves and vessels, blood clots, leg length inequality, dislocation and the failure to achieve desired results, including death, the operative extremity was marked with verbal confirmation of the patient in the holding area.   The patient was then brought to the operating room and transported to the operating room table and placed in the lateral decubitus position. The operative limb was then prepped and draped in the usual sterile fashion and preoperative antibiotics were administered.  A time out was performed prior to the start of surgery confirming the correct extremity, preoperative antibiotic administration, as well as team members, and that implants and instruments available for the case. Correct surgical site was also confirmed with preoperative radiographs. A standard posterior approach to the hip was performed. A capsulotomy was performed and the capsule tagged for later repair. The labrum was preserved. The hip was dislocated and the femoral neck cut was made at approximately 1.5 cm  above the level of the lesser trochanter using the femoral neck cutting guide. We then used a corkscrew and cobb to remove the femoral head from the acetabulum. We measured the head and proceeded to trial head sizes and found 45 mm to the the appropriate size. We then turned our attention to femoral preparation. We began sequential broaching to a size 7 stem. This size produced good fit and rotational stability. We placed the trial neck and a 45 mm +5 head.  Leg lengths were evaluated on the table. The hip was stable in extension and external rotation without impingement. The hip was stable in deep flexion. In 90 degrees of flexion and neutral abduction the hip was stable to 55 degrees of internal rotation. In a position of sleep with hip adducted across the body the hip was stable to 50 degrees of rotation.  We then turned back to the femur and tested the broach for stability and fit, and removed the trial broach. The final femoral implant was placed, and the trial head was again trialed for stability. We then irrigated and dried the trunion, and the final head was placed. We placed an 45 mm +5 head. The hip was again reduced, and taken through a range of motion and found to be stable as above. The hip was thoroughly irrigated, and a posterior capsular repair was performed with #2 Ethibond. The wound was irrigated with normal saline. The deep fascia was closed with 1 PDS, 0 vicryl for the deep fat layer, and 2.0 Vicryl Plus for the subcutaneous tissue. The skin was closed with staples. A sterile dressing was applied. The patient was awakened and transported to the recovery room in stable  condition. All sponge, needle, and instrument counts were correct at the end of the case.  Position: lateral decubitus   Complications: none.  Time Out: performed   Drains/Packing: none  Estimated blood loss: 200 cc  Returned to Recovery Room: in good condition.   Antibiotics: yes   Mechanical VTE (DVT)  Prophylaxis: sequential compression devices, TED thigh-high  Chemical VTE (DVT) Prophylaxis: lovenox  Fluid Replacement  Crystalloid: see anesthesia record Blood: none  FFP: none   Specimens Removed: 1 to pathology   Sponge and Instrument Count Correct? yes   PACU: portable radiograph - low AP pelvis  Admission: inpatient status, start PT & OT POD#1  Plan/RTC: Return in 2 weeks for wound check.  Weight Bearing/Load Lower Extremity: full  Posterior hip precautions  N. Glee Arvin, MD [facility] [phone] 5:06 PM

## 2014-07-10 NOTE — Progress Notes (Signed)
Utilization review completed.  

## 2014-07-10 NOTE — Consult Note (Addendum)
ORTHOPAEDIC CONSULTATION  REQUESTING PHYSICIAN: Reyne Dumas, MD  Chief Complaint: right hip fx  HPI: [name] Fry is a 80 y.o. female who complains of right hip pain s/p mechanical fall at home.  Lives at home with daughter.  Denies LOC, neck pain, head pain, abd pain, CP.  xrays showed right femoral neck fx.  Ortho consulted.  Past Medical History  Diagnosis Date  . Hypertension    Past Surgical History  Procedure Laterality Date  . Hernia repair     History   Social History  . Marital Status: Widowed    Spouse Name: N/A    Number of Children: N/A  . Years of Education: N/A   Social History Main Topics  . Smoking status: Never Smoker   . Smokeless tobacco: None  . Alcohol Use: No  . Drug Use: No  . Sexual Activity: None   Other Topics Concern  . None   Social History Narrative  . None   Family History  Problem Relation Age of Onset  . CAD Neg Hx   . Dementia Other    No Known Allergies Prior to Admission medications   Medication Sig Start Date End Date Taking? Authorizing Provider  aspirin 81 MG chewable tablet Chew 81 mg by mouth daily.   Yes Historical Provider, MD  furosemide (LASIX) 20 MG tablet Take 20 mg by mouth daily.   Yes Historical Provider, MD  hydrALAZINE (APRESOLINE) 50 MG tablet Take 50 mg by mouth daily.   Yes Historical Provider, MD  loratadine (CLARITIN) 10 MG tablet Take 10 mg by mouth daily.   Yes Historical Provider, MD  metoprolol (TOPROL-XL) 200 MG 24 hr tablet Take 200 mg by mouth daily.   Yes Historical Provider, MD  potassium chloride SA (K-DUR,KLOR-CON) 20 MEQ tablet Take 20 mEq by mouth daily.    Yes Historical Provider, MD  pravastatin (PRAVACHOL) 40 MG tablet Take 40 mg by mouth daily.   Yes Historical Provider, MD   Dg Hip 1 View Right  07/10/2014   CLINICAL DATA:  Hip fracture.  EXAM: RIGHT HIP - 1 VIEW  COMPARISON:  Hip series 8/18 2015.  FINDINGS: Again noted is a right femoral neck angulated fracture. Remainder the right  femur normal. Degenerative changes right hip and knee.Peripheral vascular calcification. Pelvic phleboliths.  IMPRESSION: 1.  Angulated right hip fracture.  2.  DJD right hip and right knee.  3.  Peripheral vascular disease.   Electronically Signed   By: Marcello Moores  Register   On: 07/10/2014 07:59   Dg Hip Complete Right  07/09/2014   CLINICAL DATA:  Fall, right hip pain.  EXAM: RIGHT HIP - COMPLETE 2+ VIEW  COMPARISON:  None.  FINDINGS: Right femoral neck fracture with impaction, mild varus angulation of the distal bony fragments. Femoral heads are located.  Bone mineral density is decreased without destructive bony lesions. Phleboliths in the pelvis. Periarticular soft tissue planes are nonsuspicious.  IMPRESSION: Impacted mildly angulated right femoral neck fracture without dislocation.   Electronically Signed   By: Elon Alas   On: 07/09/2014 23:58   Dg Chest Port 1 View  07/10/2014   CLINICAL DATA:  Check infiltrates.  EXAM: PORTABLE CHEST - 1 VIEW  COMPARISON:  None.  FINDINGS: There is a moderate right pleural effusion which is layering. This obscures the right lower lobe which could contain atelectasis, pneumonia, or even mass. Mild cardiomegaly. Negative aortic contours. No pulmonary edema. No pneumothorax.  IMPRESSION: Moderate right pleural effusion which obscures  the right lower lobe. Follow-up PA and lateral chest radiography recommended after convalescence from hip fracture.   Electronically Signed   By: Jorje Guild M.D.   On: 07/10/2014 01:05    Positive ROS: All other systems have been reviewed and were otherwise negative with the exception of those mentioned in the HPI and as above.  Physical Exam: General: Alert, no acute distress Cardiovascular: No pedal edema Respiratory: No cyanosis, no use of accessory musculature GI: No organomegaly, abdomen is soft and non-tender Skin: No lesions in the area of chief complaint Neurologic: Sensation intact distally Psychiatric: Patient  is competent for consent with normal mood and affect Lymphatic: No axillary or cervical lymphadenopathy  MUSCULOSKELETAL:  - very painful ROM of hip - NVI RLE - foot wwp  Assessment: Right femoral neck fx  Plan: - discussed r/b/a of partial hip replacement, patient understands and wishes to proceed - consent signed - continue NPO - will plan for surgery today - hold chemoprophylaxis DVT  - Based on history and fracture pattern this likely represents a fragility fracture. - Fragility fractures affect up to one half of women and one third of men after age 42 years and occur in the setting of bone disorder such as osteoporosis or osteopenia and warrant appropriate work-up. - The following are general recommendations that may serve as an outline for an appropriate work-up:  1.) Obtain bone density measurement to confirm presumptive diagnosis, assess severity of osteoporosis and risk of future fracture, and use as baseline for monitoring treatment  2.) Obtain laboratory tests: CBC, ESR, serum calcium, creatinine, albumin,phosphate, alkaline phosphatase, liver transaminases, protein electrophoresis, urinalysis, 25-hydroxyvitamin D.  3.) Exclude secondary causes of low bone mass and skeletal fragility (eg,multiple myeloma, lymphoma) as indicated.  4.) Obtain radiograph of thoracic and lumbar spine, particularly among individuals with back pain or height loss to assess presence of vertebral fractures  5.) Intermittent administration of recombinant human parathyroid hormone  6.) Optimize nutritional status using nutritional supplementation.  7.) Patient/family education to prevent future falls.  8.) Early mobilization and exercise program - exercise decreases the rate of bone loss and has been associated with decreased rate of fragility fractures   Thank you for the consult and the opportunity to see Ms. Kathi Simpers. Eduard Roux, MD [place] 8:05  AM

## 2014-07-10 NOTE — Transfer of Care (Signed)
Immediate Anesthesia Transfer of Care Note  Patient: [name] BryantAlice Mallozzi  Procedure(s) Performed: Procedure(s): ARTHROPLASTY BIPOLAR HIP (Right)  Patient Location: PACU  Anesthesia Type:Spinal  Level of Consciousness: awake, alert  and oriented  Airway & Oxygen Therapy: Patient connected to face mask oxygen  Post-op Assessment: Report given to PACU RN  Post vital signs: stable  Complications: No apparent anesthesia complications

## 2014-07-10 NOTE — Anesthesia Preprocedure Evaluation (Addendum)
Anesthesia Evaluation  Patient identified by MRN, date of birth, ID band Patient awake    Reviewed: Allergy & Precautions, H&P , NPO status , Patient's Chart, lab work & pertinent test results, reviewed documented beta blocker date and time   Airway Mallampati: I TM Distance: >3 FB Neck ROM: Full    Dental  (+) Teeth Intact, Dental Advisory Given   Pulmonary  breath sounds clear to auscultation        Cardiovascular hypertension, Pt. on medications and Pt. on home beta blockers Rhythm:Regular Rate:Normal     Neuro/Psych CVA (Hx in 2003 without residual.), No Residual Symptoms    GI/Hepatic   Endo/Other    Renal/GU      Musculoskeletal   Abdominal   Peds  Hematology   Anesthesia Other Findings   Reproductive/Obstetrics                        Anesthesia Physical Anesthesia Plan  ASA: II  Anesthesia Plan: General   Post-op Pain Management:    Induction: Intravenous  Airway Management Planned: Oral ETT  Additional Equipment:   Intra-op Plan:   Post-operative Plan: Extubation in OR  Informed Consent: I have reviewed the patients History and Physical, chart, labs and discussed the procedure including the risks, benefits and alternatives for the proposed anesthesia with the patient or authorized representative who has indicated his/her understanding and acceptance.   Dental advisory given  Plan Discussed with: CRNA, Anesthesiologist and Surgeon  Anesthesia Plan Comments:         Anesthesia Quick Evaluation

## 2014-07-10 NOTE — Progress Notes (Addendum)
[facility] PROGRESS NOTE  [name] Fry WUJ:[id number] DOB: [birth year] DOA: 07/09/2014 PCP: [name],[name], MD  Assessment/Plan: Principal Problem:   Femoral neck fracture Active Problems:   HTN (hypertension)   HLD (hyperlipidemia)   Closed right hip fracture   Right femoral neck fracture Partial hip replacement Possible surgery today Holding anticoagulation for DVT prophylaxis prior to surgery   Hypertension - since patient will be n.p.o. I have placed patient on scheduled IV metoprolol 5 mg every 6 hourly with when necessary IV hydralazine.  Resume lasix and PO metoprolol post surgery  Hyperlipidemia - continue statins and patient can take orally.   Mild anemia - follow CBC    Code Status: full Family Communication: family updated about patient's clinical progress Disposition Plan:  As above    Brief narrative: [name] Bryantlice Rossitto is a 80 y.o. female with history of hypertension and hyperlipidemia had a fall while patient was walking with her daughter outside the house. Patient states she stumbled and fell. Denies any chest pain shortness of breath palpitations. Patient did not hit her head or lose consciousness. Patient has been having some right hip pain since fall and was brought to the ER where x-rays revealed right hip fracture. On-call surgeon for orthopedics Dr. Magnus IvanBlackman was consulted. Patient has been admitted for further workup  Consultants: Cheral AlmasNaiping Michael Xu, MD   Procedures:  None   Antibiotics:  None   HPI/Subjective: Patient complaining off hip pain  Objective: Filed Vitals:   07/10/14 0108 07/10/14 0149 07/10/14 0300 07/10/14 0429  BP: 173/84 195/90 198/88 169/73  Pulse: 69 71 64 64  Temp:  97.7 F (36.5 C)  98.1 F (36.7 C)  TempSrc:  Oral  Oral  Resp:  18  16  Weight:  43.954 kg (96 lb 14.4 oz)    SpO2: 97% 98%  97%    Intake/Output Summary (Last 24 hours) at 07/10/14 1145 Last data filed at 07/10/14 1032  Gross per 24 hour   Intake     30 ml  Output   1025 ml  Net   -995 ml    Exam:  General: Alert, no acute distress  Cardiovascular: No pedal edema  Respiratory: No cyanosis, no use of accessory musculature  GI: No organomegaly, abdomen is soft and non-tender  Skin: No lesions in the area of chief complaint  Neurologic: Sensation intact distally  Psychiatric: Patient is competent for consent with normal mood and affect  Lymphatic: No axillary or cervical lymphadenopathy       Data Reviewed: Basic Metabolic Panel:  Recent Labs Lab 07/09/14 2257 07/10/14 0720  NA 135* 137  K 3.7 3.7  CL 97 97  CO2 27 27  GLUCOSE 114* 126*  BUN 7 7  CREATININE 0.60 0.52  CALCIUM 8.5 8.8    Liver Function Tests:  Recent Labs Lab 07/10/14 0720  AST 18  ALT 6  ALKPHOS 85  BILITOT 0.8  PROT 7.5  ALBUMIN 2.5*   No results found for this basename: LIPASE, AMYLASE,  in the last 168 hours No results found for this basename: AMMONIA,  in the last 168 hours  CBC:  Recent Labs Lab 07/09/14 2257 07/10/14 0720  WBC 8.8 10.5  NEUTROABS 5.7 8.6*  HGB 11.9* 12.4  HCT 35.7* 37.7  MCV 82.3 84.0  PLT 378 390    Cardiac Enzymes: No results found for this basename: CKTOTAL, CKMB, CKMBINDEX, TROPONINI,  in the last 168 hours BNP (last 3 results) No results found for this basename: PROBNP,  in the last 8760 hours   CBG: No results found for this basename: GLUCAP,  in the last 168 hours  No results found for this or any previous visit (from the past 240 hour(s)).   Studies: Dg Hip 1 View Right  07/10/2014   CLINICAL DATA:  Hip fracture.  EXAM: RIGHT HIP - 1 VIEW  COMPARISON:  Hip series 8/18 2015.  FINDINGS: Again noted is a right femoral neck angulated fracture. Remainder the right femur normal. Degenerative changes right hip and knee.Peripheral vascular calcification. Pelvic phleboliths.  IMPRESSION: 1.  Angulated right hip fracture.  2.  DJD right hip and right knee.  3.  Peripheral vascular  disease.   Electronically Signed   By: Maisie Fus  Register   On: 07/10/2014 07:59   Dg Hip Complete Right  07/09/2014   CLINICAL DATA:  Fall, right hip pain.  EXAM: RIGHT HIP - COMPLETE 2+ VIEW  COMPARISON:  None.  FINDINGS: Right femoral neck fracture with impaction, mild varus angulation of the distal bony fragments. Femoral heads are located.  Bone mineral density is decreased without destructive bony lesions. Phleboliths in the pelvis. Periarticular soft tissue planes are nonsuspicious.  IMPRESSION: Impacted mildly angulated right femoral neck fracture without dislocation.   Electronically Signed   By: Awilda Metro   On: 07/09/2014 23:58   Dg Chest Port 1 View  07/10/2014   CLINICAL DATA:  Check infiltrates.  EXAM: PORTABLE CHEST - 1 VIEW  COMPARISON:  None.  FINDINGS: There is a moderate right pleural effusion which is layering. This obscures the right lower lobe which could contain atelectasis, pneumonia, or even mass. Mild cardiomegaly. Negative aortic contours. No pulmonary edema. No pneumothorax.  IMPRESSION: Moderate right pleural effusion which obscures the right lower lobe. Follow-up PA and lateral chest radiography recommended after convalescence from hip fracture.   Electronically Signed   By: Tiburcio Pea M.D.   On: 07/10/2014 01:05    Scheduled Meds: .  ceFAZolin (ANCEF) IV  2 g Intravenous On Call to OR  . metoprolol  5 mg Intravenous 4 times per day  . [START ON 07/11/2014] pneumococcal 23 valent vaccine  0.5 mL Intramuscular Tomorrow-1000   Continuous Infusions: . sodium chloride 10 mL (07/10/14 0400)  . sodium chloride 125 mL/hr at 07/10/14 0830    Principal Problem:   Femoral neck fracture Active Problems:   HTN (hypertension)   HLD (hyperlipidemia)   Closed right hip fracture    Time spent: 40 minutes   [facility]  [facility] Pager [phone]. If 7PM-7AM, please contact night-coverage at [email or web address], password [email or web address] 07/10/2014, 11:45 AM  LOS: 1 day

## 2014-07-11 ENCOUNTER — Encounter (HOSPITAL_COMMUNITY): Payer: Self-pay | Admitting: Orthopaedic Surgery

## 2014-07-11 DIAGNOSIS — E43 Unspecified severe protein-calorie malnutrition: Secondary | ICD-10-CM | POA: Insufficient documentation

## 2014-07-11 LAB — CBC
HCT: 33 % — ABNORMAL LOW (ref 36.0–46.0)
Hemoglobin: 10.9 g/dL — ABNORMAL LOW (ref 12.0–15.0)
MCH: 27.3 pg (ref 26.0–34.0)
MCHC: 33 g/dL (ref 30.0–36.0)
MCV: 82.7 fL (ref 78.0–100.0)
Platelets: 384 10*3/uL (ref 150–400)
RBC: 3.99 MIL/uL (ref 3.87–5.11)
RDW: 14 % (ref 11.5–15.5)
WBC: 9.9 10*3/uL (ref 4.0–10.5)

## 2014-07-11 LAB — COMPREHENSIVE METABOLIC PANEL
ALT: 5 U/L (ref 0–35)
AST: 22 U/L (ref 0–37)
Albumin: 1.9 g/dL — ABNORMAL LOW (ref 3.5–5.2)
Alkaline Phosphatase: 66 U/L (ref 39–117)
Anion gap: 10 (ref 5–15)
BUN: 11 mg/dL (ref 6–23)
CALCIUM: 8.3 mg/dL — AB (ref 8.4–10.5)
CO2: 25 meq/L (ref 19–32)
Chloride: 101 mEq/L (ref 96–112)
Creatinine, Ser: 0.58 mg/dL (ref 0.50–1.10)
GFR, EST AFRICAN AMERICAN: 90 mL/min — AB (ref >90)
GFR, EST NON AFRICAN AMERICAN: 78 mL/min — AB (ref >90)
GLUCOSE: 110 mg/dL — AB (ref 70–99)
Potassium: 4.3 mEq/L (ref 3.7–5.3)
SODIUM: 136 meq/L — AB (ref 137–147)
Total Bilirubin: 0.6 mg/dL (ref 0.3–1.2)
Total Protein: 5.8 g/dL — ABNORMAL LOW (ref 6.0–8.3)

## 2014-07-11 LAB — URINE CULTURE
CULTURE: NO GROWTH
Colony Count: NO GROWTH

## 2014-07-11 LAB — VITAMIN D 25 HYDROXY (VIT D DEFICIENCY, FRACTURES): Vit D, 25-Hydroxy: 14 ng/mL — ABNORMAL LOW (ref 30–89)

## 2014-07-11 MED ORDER — ENSURE COMPLETE PO LIQD
237.0000 mL | Freq: Two times a day (BID) | ORAL | Status: DC
  Administered 2014-07-12: 237 mL via ORAL

## 2014-07-11 NOTE — Progress Notes (Signed)
OT Cancellation Note  Patient Details Name: [name] Fry MRN: [id number] DOB: 1920/02/16   Cancelled Treatment:    Reason Eval/Treat Not Completed: Other (comment) (patient currently has bedrest orders; will check later time)  Romir Klimowicz A 07/11/2014, 10:28 AM

## 2014-07-11 NOTE — Progress Notes (Signed)
NUTRITION FOLLOW-UP  Pt meets criteria for SEVERE MALNUTRITION in the context of chronic illness as evidenced by severe fat and muscle mass depletion.  DOCUMENTATION CODES Per approved criteria  -Severe malnutrition in the context of chronic illness   INTERVENTION: -Provide Ensure Complete po BID, each supplement provides 350 kcal and 13 grams of protein  NUTRITION DIAGNOSIS: Increased nutrient needs related to s/p surgery as evidenced by estimated nutrition needs; Ongoing  Goal: Pt to meet >/= 90% of their estimated nutrition needs, unmet  Monitor:  Diet advancement, weight trends, labs, I/O's  80 y.o. female  Admitting Dx: Femoral neck fracture  ASSESSMENT: Pt with PMH of hypertension and hyperlipidemia had a fall while patient was walking with her daughter outside the house.   8/19-Pt is NPO for surgery today. Pt reports she has a good appetite currently and prior to admission back at home, eating 3 full meals a day. Pt reports her usual body weight of around 100 lbs. Noted there is no height charted in Epic. Pt reports she does not know her height, but reports she is "5 feet something".Pt denies any nausea, stomach pains, or difficulties chewing/swallowing. Pt is willing to try oral supplements once diet is advanced to help increase her calorie and protein intake. Will order once diet is advanced.  8/20- Pt reports she has a god appetite. Meal completion is 25%. Pt reports she does not usually eat a lot in one sitting. Pt is willing to try Ensure. Will order. Pt was educated to drink Ensure at home to increase her calorie and protein intake. Pt expressed understanding.   Height: Ht Readings from Last 1 Encounters:  No data found for Ht    Weight: Wt Readings from Last 1 Encounters:  07/10/14 96 lb 14.4 oz (43.954 kg)   Usual Body Weight: 100 lbs  % Usual Body Weight: 96%  BMI:  There is no height on file to calculate BMI.  Re-Estimated Nutritional Needs: Kcal:  1500-1700 Protein: 60-75 grams Fluid: 1.5 L - 1.7 L/day  Skin: Non-pitting LE edema, incision right hip  Diet Order: General   Intake/Output Summary (Last 24 hours) at 07/11/14 1432 Last data filed at 07/11/14 1300  Gross per 24 hour  Intake 2692.08 ml  Output    875 ml  Net 1817.08 ml    Last BM: 8/19  Labs:   Recent Labs Lab 07/09/14 2257 07/10/14 0720 07/11/14 0506  NA 135* 137 136*  K 3.7 3.7 4.3  CL 97 97 101  CO2 27 27 25   BUN 7 7 11   CREATININE 0.60 0.52 0.58  CALCIUM 8.5 8.8 8.3*  GLUCOSE 114* 126* 110*    CBG (last 3)  No results found for this basename: GLUCAP,  in the last 72 hours  Scheduled Meds: . enoxaparin (LOVENOX) injection  40 mg Subcutaneous Q24H  . furosemide  20 mg Oral Daily  . metoprolol succinate  100 mg Oral Daily  . pneumococcal 23 valent vaccine  0.5 mL Intramuscular Tomorrow-1000  . senna  1 tablet Oral BID    Continuous Infusions: . sodium chloride 10 mL (07/10/14 0400)  . sodium chloride 125 mL/hr (07/11/14 0500)  . lactated ringers 50 mL/hr at 07/10/14 1325    Past Medical History  Diagnosis Date  . Hypertension     Past Surgical History  Procedure Laterality Date  . Hernia repair    . Hip arthroplasty Right 07/10/2014    Procedure: ARTHROPLASTY BIPOLAR HIP;  Surgeon: Cheral AlmasNaiping Michael Xu, MD;  Location: [facility] OR;  Service: Orthopedics;  Laterality: Right;    Marijean Niemann, MS, Provisional LDN Pager # [phone] After hours/ weekend pager # [phone]

## 2014-07-11 NOTE — Anesthesia Postprocedure Evaluation (Signed)
  Anesthesia Post-op Note  Patient: [name] Fry  Procedure(s) Performed: Procedure(s): ARTHROPLASTY BIPOLAR HIP (Right)  Patient Location: PACU  Anesthesia Type:Spinal  Level of Consciousness: awake and alert   Airway and Oxygen Therapy: Patient Spontanous Breathing and Patient connected to nasal cannula oxygen  Post-op Pain: none  Post-op Assessment: Post-op Vital signs reviewed, Patient's Cardiovascular Status Stable, Respiratory Function Stable, Patent Airway and Pain level controlled  Post-op Vital Signs: stable  Last Vitals:  Filed Vitals:   07/10/14 2100  BP: 157/64  Pulse: 65  Temp: 36.6 C  Resp: 20    Complications: No apparent anesthesia complications

## 2014-07-11 NOTE — Clinical Documentation Improvement (Signed)
  Noted in chart eval by Registered Dietician 8/19 states "meets criteria for Severe Malnutrition  in the context of chronic illness as evidenced by severe fat and muscle mass depletion". Based on clinical indicators, if you agree, please add to documentation to reflect severity of illness and risk of mortality in this patient.  Possible Clinical Conditions? - Severe Malnutrition   - Severe Protein Calorie Malnutrition - Cachexia   - Other Condition  Supporting Information: - Subcutaneous Fat:  Thoracic and Lumbar Region: Severe depletion  - Muscle:  Temple Region: Moderate depletion  Clavicle Bone Region: Severe depletion  Clavicle and Acromion Bone Region: Severe depletion  Scapular Bone Region: N/A  Dorsal Hand: Severe depletion  Patellar Region: Moderate depletion  Anterior Thigh Region: Moderate depletion  Posterior Calf Region: Moderate depletion - Weight: 96 lbs  Thank You, Beverley FiedlerLaurie E Gevin Perea ,RN Clinical Documentation Specialist:  [phone]  [facility]- Health Information Management

## 2014-07-11 NOTE — Evaluation (Signed)
Physical Therapy Evaluation Patient Details Name: Gennie Eisinger MRN: [id number] DOB: 12-30-1919 Today's Date: 07/11/2014   History of Present Illness  80 y.o. female s/p right ARTHROPLASTY BIPOLAR HIP. Hx of HTN  Clinical Impression  Patient is seen following the above procedure, presenting with functional limitations due to the deficits listed below (see PT Problem List). Pt currently Min-Mod assist with mobility and was independent with all ADLs prior to this admission. She lives with her daughter at home and is highly motivated to improve in order to return to her prior level of function. Patient will benefit from skilled PT to increase their independence and safety with mobility to allow discharge to the venue listed below.      Follow Up Recommendations CIR    Equipment Recommendations  Rolling walker with 5" wheels;3in1 (PT)    Recommendations for Other Services Rehab consult;OT consult     Precautions / Restrictions Precautions Precautions: Posterior Hip;Fall Precaution Booklet Issued: Yes (comment) Precaution Comments: Reviewed posterior hip precautions with handout Restrictions Weight Bearing Restrictions: Yes RLE Weight Bearing: Weight bearing as tolerated      Mobility  Bed Mobility Overal bed mobility: Needs Assistance Bed Mobility: Supine to Sit     Supine to sit: Mod assist     General bed mobility comments: Mod assist for trunk control and use of bed pad for pt to scoot forward with RLE support to off of bed. Pt able to use bed rail to assist. VC for technique.  Transfers Overall transfer level: Needs assistance Equipment used: Rolling walker (2 wheeled) Transfers: Sit to/from Stand Sit to Stand: Min assist         General transfer comment: Min assist for boost from lowest bed setting. VC for hand/ foot placement and upright posture upon standing as pt tends to lean heavily over RW. Pt leans heavily to posterior initially upon standing with posterior  knees supported from bed.  Ambulation/Gait Ambulation/Gait assistance: Min assist Ambulation Distance (Feet): 6 Feet Assistive device: Rolling walker (2 wheeled) Gait Pattern/deviations: Step-to pattern;Decreased step length - left;Decreased stance time - right;Antalgic   Gait velocity interpretation: Below normal speed for age/gender General Gait Details: Educated on safe DME use with rolling walker. Max verbal cues for sequencing. Min assist for RW control. Able to support weight through UEs on RW, and tolerating moderate weight bearing through RLE.  Stairs            Wheelchair Mobility    Modified Rankin (Stroke Patients Only)       Balance Overall balance assessment: Needs assistance;History of Falls Sitting-balance support: No upper extremity supported;Feet supported Sitting balance-Leahy Scale: Fair     Standing balance support: Single extremity supported Standing balance-Leahy Scale: Poor                               Pertinent Vitals/Pain Pain Assessment: 0-10 Pain Score: 5  Pain Location: right hip Pain Intervention(s): Limited activity within patient's tolerance;Monitored during session;Premedicated before session;Repositioned    Home Living Family/patient expects to be discharged to:: Private residence Living Arrangements: Children Available Help at Discharge: Family;Available 24 hours/day (Lives with daughter) Type of Home: House Home Access: Stairs to enter Entrance Stairs-Rails: None Entrance Stairs-Number of Steps: 2 Home Layout: One level Home Equipment: None      Prior Function Level of Independence: Independent               Hand Dominance   Dominant  Hand: Right    Extremity/Trunk Assessment   Upper Extremity Assessment: Defer to OT evaluation           Lower Extremity Assessment: RLE deficits/detail RLE Deficits / Details: Decreased strength and ROM as expected post op - limited assessment due to hip   precautions       Communication   Communication: No difficulties  Cognition Arousal/Alertness: Awake/alert Behavior During Therapy: WFL for tasks assessed/performed Overall Cognitive Status: Within Functional Limits for tasks assessed       Memory: Decreased recall of precautions              General Comments      Exercises Total Joint Exercises Ankle Circles/Pumps: AROM;Both;10 reps;Seated Quad Sets: Both;10 reps;Seated;Strengthening      Assessment/Plan    PT Assessment Patient needs continued PT services  PT Diagnosis Difficulty walking;Abnormality of gait;Acute pain   PT Problem List Decreased strength;Decreased range of motion;Decreased activity tolerance;Decreased balance;Decreased mobility;Decreased knowledge of use of DME;Decreased knowledge of precautions;Pain  PT Treatment Interventions DME instruction;Gait training;Functional mobility training;Therapeutic activities;Therapeutic exercise;Balance training;Neuromuscular re-education;Patient/family education;Modalities   PT Goals (Current goals can be found in the Care Plan section) Acute Rehab PT Goals Patient Stated Goal: I would like to go home but will consider rehab. PT Goal Formulation: With patient Time For Goal Achievement: 07/18/14 Potential to Achieve Goals: Good    Frequency Min 5X/week   Barriers to discharge        Co-evaluation               End of Session   Activity Tolerance: Patient tolerated treatment well Patient left: in chair;with call bell/phone within reach;with family/visitor present Nurse Communication: Mobility status         Time: 1209-1236 PT Time Calculation (min): 27 min   Charges:   PT Evaluation $Initial PT Evaluation Tier I: 1 Procedure PT Treatments $Therapeutic Activity: 8-22 mins   PT G Codes:         Charlsie MerlesLogan Secor Eryanna Regal, [place] CarolinaPT [phone]  Berton MountBarbour, Lyrika Souders S 07/11/2014, 1:34 PM

## 2014-07-11 NOTE — Progress Notes (Signed)
   Subjective:  Patient reports pain as mild.  No events.    Objective:   VITALS:   Filed Vitals:   07/10/14 2031 07/10/14 2100 07/11/14 0132 07/11/14 0500  BP: 158/73 157/64 169/72 142/56  Pulse: 61 65 82 66  Temp:  97.8 F (36.6 C)  98.2 F (36.8 C)  TempSrc:  Oral  Oral  Resp:  20  20  Weight:      SpO2:  100%  96%    Neurologically intact Neurovascular intact Sensation intact distally Intact pulses distally Dorsiflexion/Plantar flexion intact Incision: dressing C/D/I and no drainage No cellulitis present Compartment soft   Lab Results  Component Value Date   WBC 9.9 07/11/2014   HGB 10.9* 07/11/2014   HCT 33.0* 07/11/2014   MCV 82.7 07/11/2014   PLT 384 07/11/2014     Assessment/Plan:  1 Day Post-Op   - Expected postop acute blood loss anemia - will monitor for symptoms - Up with PT/OT - DVT ppx - SCDs, ambulation, lovenox - WBAT right lower extremity - posterior hip precautions - Pain control - Discharge planning - likely SNF  Cheral AlmasXu, Bhakti Labella Michael 07/11/2014, 7:52 AM [phone]

## 2014-07-11 NOTE — Progress Notes (Signed)
Note and chart reviewed. Agree with assessment/intervention.  Ian Malkineanne Barnett RD, LDN Pager: [phone]

## 2014-07-11 NOTE — Evaluation (Signed)
Occupational Therapy Evaluation Patient Details Name: [name] Fry MRN: [id number] DOB: 1920/03/12 Today's Date: 07/11/2014    History of Present Illness 80 y.o. female s/p right ARTHROPLASTY BIPOLAR HIP. Hx of HTN   Clinical Impression   This 80 yo female admitted with above presents to acute OT with decreased balance, decreased mobility, decreased use of RLE, posterior hip precautions, and generalized weakness (being 80 years old) all affecting her PLOF of Mod I-S. Pt will benefit from acute OT with follow up on inpatient rehab to get back to a S- Mod I level.    Follow Up Recommendations  CIR    Equipment Recommendations  3 in 1 bedside comode       Precautions / Restrictions Precautions Precautions: Posterior Hip;Fall Precaution Booklet Issued: Yes (comment) Precaution Comments: Reviewed posterior hip precautions with handout Restrictions Weight Bearing Restrictions: No RLE Weight Bearing: Weight bearing as tolerated      Mobility Bed Mobility Overal bed mobility: Needs Assistance Bed Mobility: Sit to Supine     Sit to supine: Max assist   General bed mobility comments: A for trunk and UB  Transfers Overall transfer level: Needs assistance Equipment used: Rolling walker (2 wheeled) Transfers: Sit to/from Stand Sit to Stand: Min assist            Balance Overall balance assessment: Needs assistance Sitting-balance support: No upper extremity supported;Feet supported Sitting balance-Leahy Scale: Fair     Standing balance support: Bilateral upper extremity supported Standing balance-Leahy Scale: Poor                              ADL Overall ADL's : Needs assistance/impaired Eating/Feeding: Independent;Sitting   Grooming: Set up;Sitting   Upper Body Bathing: Set up;Sitting   Lower Body Bathing: Maximal assistance;Sit to/from stand   Upper Body Dressing : Minimal assistance;Sitting   Lower Body Dressing: Total assistance;Sit to/from  stand   Toilet Transfer: Minimal assistance;Stand-pivot;RW (recliner to bed going to patient's left)   Toileting- Clothing Manipulation and Hygiene: Moderate assistance;Sit to/from stand                         Pertinent Vitals/Pain Pain Assessment: 0-10 Pain Score: 4  Pain Location: right hip Pain Intervention(s): Limited activity within patient's tolerance;Monitored during session;Repositioned     Hand Dominance Right   Extremity/Trunk Assessment Upper Extremity Assessment Upper Extremity Assessment: Generalized weakness (decreased AROM of both shoulders (Right worse than Left))       Communication Communication Communication: No difficulties   Cognition Arousal/Alertness: Awake/alert Behavior During Therapy: WFL for tasks assessed/performed Overall Cognitive Status: Within Functional Limits for tasks assessed       Memory: Decreased recall of precautions                        Home Living Family/patient expects to be discharged to:: Private residence Living Arrangements: Children Available Help at Discharge: Family;Available 24 hours/day (lives with daughter) Type of Home: House Home Access: Stairs to enter Secretary/administratorntrance Stairs-Number of Steps: 2 Entrance Stairs-Rails: None Home Layout: One level     Bathroom Shower/Tub: Tub/shower unit;Curtain Shower/tub characteristics: Engineer, building servicesCurtain Bathroom Toilet: Standard     Home Equipment: None          Prior Functioning/Environment Level of Independence: Independent             OT Diagnosis: Generalized weakness;Acute pain   OT Problem List: Decreased  strength;Decreased range of motion;Impaired balance (sitting and/or standing);Pain;Decreased knowledge of use of DME or AE   OT Treatment/Interventions: Self-care/ADL training;Patient/family education;Balance training;DME and/or AE instruction    OT Goals(Current goals can be found in the care plan section) Acute Rehab OT Goals Patient Stated Goal:  I would like to go home but will consider rehab. OT Goal Formulation: With patient Time For Goal Achievement: 07/18/14 Potential to Achieve Goals: Good  OT Frequency: Min 2X/week              End of Session Equipment Utilized During Treatment: Gait belt;Rolling walker  Activity Tolerance: Patient tolerated treatment well Patient left: in bed;with call bell/phone within reach;with bed alarm set   Time: 1610-9604 OT Time Calculation (min): 15 min Charges:  OT General Charges $OT Visit: 1 Procedure OT Evaluation $Initial OT Evaluation Tier I: 1 Procedure OT Treatments $Self Care/Home Management : 8-22 mins  Evette Georges [phone] 07/11/2014, 3:52 PM

## 2014-07-11 NOTE — Progress Notes (Signed)
[facility] PROGRESS NOTE  Jefm Bryantlice Fabela KGM:[id number] DOB: Apr 25, 1920 DOA: 07/09/2014 PCP: [name],[name], MD  Assessment/Plan: Principal Problem:   Femoral neck fracture Active Problems:   HTN (hypertension)   HLD (hyperlipidemia)   Closed right hip fracture   Protein-calorie malnutrition, severe   Right femoral neck fracture  Status post Partial hip replacement  Expected postop acute blood loss anemia On Lovenox for DVT prophylaxis PT/OT eval pending WBAT right lower extremity  - posterior hip precautions  - Pain control  Acute blood loss anemia Not a significant drop, no indication for transfusion We'll recheck CBC in the morning  Hypertension - continue when necessary IV hydralazine.  Resume lasix , hydralazine and PO metoprolol post surgery   Hyperlipidemia - continue statins and patient can take orally.     Code Status: full  Family Communication: family updated about patient's clinical progress  Disposition Plan: PT/OT eval pending  Brief narrative:  Jefm Bryantlice Learn is a 80 y.o. female with history of hypertension and hyperlipidemia had a fall while patient was walking with her daughter outside the house. Patient states she stumbled and fell. Denies any chest pain shortness of breath palpitations. Patient did not hit her head or lose consciousness. Patient has been having some right hip pain since fall and was brought to the ER where x-rays revealed right hip fracture. On-call surgeon for orthopedics Dr. Magnus IvanBlackman was consulted. Patient has been admitted for further workup  Consultants:  Naiping Glee ArvinMichael Xu, MD  Procedures:  None  Antibiotics:  None  HPI/Subjective:  Laying comfortably in bed without complaints  Objective: Filed Vitals:   07/10/14 2031 07/10/14 2100 07/11/14 0132 07/11/14 0500  BP: 158/73 157/64 169/72 142/56  Pulse: 61 65 82 66  Temp:  97.8 F (36.6 C)  98.2 F (36.8 C)  TempSrc:  Oral  Oral  Resp:  20  20  Weight:      SpO2:   100%  96%    Intake/Output Summary (Last 24 hours) at 07/11/14 1107 Last data filed at 07/11/14 0900  Gross per 24 hour  Intake 2572.08 ml  Output    875 ml  Net 1697.08 ml    Exam:  General: alert & oriented x 3 In NAD  Cardiovascular: RRR, nl S1 s2  Respiratory: Decreased breath sounds at the bases, scattered rhonchi, no crackles  Abdomen: soft +BS NT/ND, no masses palpable  Extremities: No cyanosis and no edema      Data Reviewed: Basic Metabolic Panel:  Recent Labs Lab 07/09/14 2257 07/10/14 0720 07/11/14 0506  NA 135* 137 136*  K 3.7 3.7 4.3  CL 97 97 101  CO2 27 27 25   GLUCOSE 114* 126* 110*  BUN 7 7 11   CREATININE 0.60 0.52 0.58  CALCIUM 8.5 8.8 8.3*    Liver Function Tests:  Recent Labs Lab 07/10/14 0720 07/11/14 0506  AST 18 22  ALT 6 5  ALKPHOS 85 66  BILITOT 0.8 0.6  PROT 7.5 5.8*  ALBUMIN 2.5* 1.9*   No results found for this basename: LIPASE, AMYLASE,  in the last 168 hours No results found for this basename: AMMONIA,  in the last 168 hours  CBC:  Recent Labs Lab 07/09/14 2257 07/10/14 0720 07/11/14 0506  WBC 8.8 10.5 9.9  NEUTROABS 5.7 8.6*  --   HGB 11.9* 12.4 10.9*  HCT 35.7* 37.7 33.0*  MCV 82.3 84.0 82.7  PLT 378 390 384    Cardiac Enzymes: No results found for this basename: CKTOTAL, CKMB,  CKMBINDEX, TROPONINI,  in the last 168 hours BNP (last 3 results) No results found for this basename: PROBNP,  in the last 8760 hours   CBG: No results found for this basename: GLUCAP,  in the last 168 hours  Recent Results (from the past 240 hour(s))  URINE CULTURE     Status: None   Collection Time    07/10/14 12:30 AM      Result Value Ref Range Status   Specimen Description URINE, CATHETERIZED   Final   Special Requests NONE   Final   Culture  Setup Time     Final   Value: 07/10/2014 01:23     Performed at [facility] Count     Final   Value: NO GROWTH     Performed at [facility]    Culture     Final   Value: NO GROWTH     Performed at [facility]   Report Status 07/11/2014 FINAL   Final  SURGICAL PCR SCREEN     Status: None   Collection Time    07/10/14  8:31 AM      Result Value Ref Range Status   MRSA, PCR NEGATIVE  NEGATIVE Final   Staphylococcus aureus NEGATIVE  NEGATIVE Final   Comment:            The Xpert SA Assay (FDA     approved for NASAL specimens     in patients over 23 years of age),     is one component of     a comprehensive surveillance     program.  Test performance has     been validated by [employer] for patients greater     than or equal to 41 year old.     It is not intended     to diagnose infection nor to     guide or monitor treatment.     Studies: Dg Hip 1 View Right  07/10/2014   CLINICAL DATA:  Hip fracture.  EXAM: RIGHT HIP - 1 VIEW  COMPARISON:  Hip series 8/18 2015.  FINDINGS: Again noted is a right femoral neck angulated fracture. Remainder the right femur normal. Degenerative changes right hip and knee.Peripheral vascular calcification. Pelvic phleboliths.  IMPRESSION: 1.  Angulated right hip fracture.  2.  DJD right hip and right knee.  3.  Peripheral vascular disease.   Electronically Signed   By: Maisie Fus  Register   On: 07/10/2014 07:59   Dg Hip Complete Right  07/09/2014   CLINICAL DATA:  Fall, right hip pain.  EXAM: RIGHT HIP - COMPLETE 2+ VIEW  COMPARISON:  None.  FINDINGS: Right femoral neck fracture with impaction, mild varus angulation of the distal bony fragments. Femoral heads are located.  Bone mineral density is decreased without destructive bony lesions. Phleboliths in the pelvis. Periarticular soft tissue planes are nonsuspicious.  IMPRESSION: Impacted mildly angulated right femoral neck fracture without dislocation.   Electronically Signed   By: Awilda Metro   On: 07/09/2014 23:58   Dg Pelvis Portable  07/10/2014   CLINICAL DATA:  Postop right hip arthroplasty.  EXAM: PORTABLE PELVIS 1-2 VIEWS   COMPARISON:  07/09/2004 hip radiographs.  FINDINGS: 1748 hr. Interval right hip bipolar hemiarthroplasty. The hardware appears well positioned. There is no evidence of acute fracture or dislocation. Soft tissue emphysema is present surrounding the right hip joint. Skin staples are in place.  IMPRESSION: No demonstrated complication  following right hip hemiarthroplasty.   Electronically Signed   By: Roxy Horseman M.D.   On: 07/10/2014 17:57   Dg Chest Port 1 View  07/10/2014   CLINICAL DATA:  Check infiltrates.  EXAM: PORTABLE CHEST - 1 VIEW  COMPARISON:  None.  FINDINGS: There is a moderate right pleural effusion which is layering. This obscures the right lower lobe which could contain atelectasis, pneumonia, or even mass. Mild cardiomegaly. Negative aortic contours. No pulmonary edema. No pneumothorax.  IMPRESSION: Moderate right pleural effusion which obscures the right lower lobe. Follow-up PA and lateral chest radiography recommended after convalescence from hip fracture.   Electronically Signed   By: Tiburcio Pea M.D.   On: 07/10/2014 01:05    Scheduled Meds: . enoxaparin (LOVENOX) injection  40 mg Subcutaneous Q24H  . furosemide  20 mg Oral Daily  . metoprolol  5 mg Intravenous 4 times per day  . metoprolol succinate  100 mg Oral Daily  . pneumococcal 23 valent vaccine  0.5 mL Intramuscular Tomorrow-1000  . senna  1 tablet Oral BID   Continuous Infusions: . sodium chloride 10 mL (07/10/14 0400)  . sodium chloride 125 mL/hr (07/11/14 0500)  . lactated ringers 50 mL/hr at 07/10/14 1325    Principal Problem:   Femoral neck fracture Active Problems:   HTN (hypertension)   HLD (hyperlipidemia)   Closed right hip fracture   Protein-calorie malnutrition, severe    Time spent: 40 minutes   [facility]  [facility] Pager [phone]. If 7PM-7AM, please contact night-coverage at [email or web address], password [facility] 07/11/2014, 11:07 AM  LOS: 2 days

## 2014-07-11 NOTE — Care Management Note (Signed)
CARE MANAGEMENT NOTE 07/11/2014  Patient:  Fry,[name]   Account Number:  [id number]  Date Initiated:  07/11/2014  Documentation initiated by:  Vance PeperBRADY,Lenard Kampf  Subjective/Objective Assessment:   80 yr old female admitted with right hip fracture after fall at home. s/p right total hip arthroplasty.     Action/Plan:   [facility] Social worker is aware.   Anticipated DC Date:  07/12/2014   Anticipated DC Plan:  SKILLED NURSING FACILITY  In-house referral  Clinical Social Worker      DC Planning Services  CM consult      [facility]  NA   Choice offered to / List presented to:     DME arranged  NA        HH arranged  NA      Status of service:  In process, will continue to follow Medicare Important Message given?   (If response is "NO", the following Medicare IM given date fields will be blank) Date Medicare IM given:   Medicare IM given by:   Date Additional Medicare IM given:   Additional Medicare IM given by:    Discharge Disposition:    Per UR Regulation:  Reviewed for med. necessity/level of care/duration of stay

## 2014-07-11 NOTE — Progress Notes (Signed)
Received prescreen request for inpatient rehab and have reviewed pt's case. Pt has Blue Medicare and it is unlikely that they would give authorization for inpatient rehab for a single lower extremity fracture.  In light of this, we would recommend pursuing skilled nursing facility or home health support. If medical team would like to pursue inpatient rehab, please contact me.  Thanks.  Juliann MuleJanine Aubrina Nieman, PT Rehabilitation Admissions Coordinator [phone]

## 2014-07-11 NOTE — Progress Notes (Signed)
Note/chart reviewed.  [name], RD, LDN Pager #: [phone] After-Hours Pager #: [phone]  

## 2014-07-12 LAB — BASIC METABOLIC PANEL
ANION GAP: 9 (ref 5–15)
BUN: 16 mg/dL (ref 6–23)
CALCIUM: 8.1 mg/dL — AB (ref 8.4–10.5)
CHLORIDE: 104 meq/L (ref 96–112)
CO2: 25 mEq/L (ref 19–32)
Creatinine, Ser: 0.74 mg/dL (ref 0.50–1.10)
GFR calc non Af Amer: 72 mL/min — ABNORMAL LOW (ref >90)
GFR, EST AFRICAN AMERICAN: 83 mL/min — AB (ref >90)
Glucose, Bld: 107 mg/dL — ABNORMAL HIGH (ref 70–99)
Potassium: 4.1 mEq/L (ref 3.7–5.3)
Sodium: 138 mEq/L (ref 137–147)

## 2014-07-12 LAB — CBC
HEMATOCRIT: 32 % — AB (ref 36.0–46.0)
Hemoglobin: 10.4 g/dL — ABNORMAL LOW (ref 12.0–15.0)
MCH: 27.6 pg (ref 26.0–34.0)
MCHC: 32.5 g/dL (ref 30.0–36.0)
MCV: 84.9 fL (ref 78.0–100.0)
PLATELETS: 362 10*3/uL (ref 150–400)
RBC: 3.77 MIL/uL — ABNORMAL LOW (ref 3.87–5.11)
RDW: 14.1 % (ref 11.5–15.5)
WBC: 9.5 10*3/uL (ref 4.0–10.5)

## 2014-07-12 MED ORDER — SENNA 8.6 MG PO TABS: 1.0000 | ORAL_TABLET | Freq: Two times a day (BID) | ORAL | Status: DC

## 2014-07-12 MED ORDER — HYDRALAZINE HCL 50 MG PO TABS
50.0000 mg | ORAL_TABLET | Freq: Two times a day (BID) | ORAL | Status: DC
  Administered 2014-07-12: 50 mg via ORAL
  Filled 2014-07-12 (×4): qty 1

## 2014-07-12 MED ORDER — ENOXAPARIN SODIUM 40 MG/0.4ML ~~LOC~~ SOLN: 40.0000 mg | Freq: Every day | SUBCUTANEOUS | Status: DC

## 2014-07-12 MED ORDER — METOPROLOL SUCCINATE ER 100 MG PO TB24: 100.0000 mg | ORAL_TABLET | Freq: Every day | ORAL | Status: DC

## 2014-07-12 MED ORDER — POLYETHYLENE GLYCOL 3350 17 G PO PACK: 17.0000 g | PACK | Freq: Every day | ORAL | Status: DC | PRN

## 2014-07-12 MED ORDER — ENSURE COMPLETE PO LIQD: 237.0000 mL | Freq: Two times a day (BID) | ORAL | Status: DC

## 2014-07-12 MED ORDER — SORBITOL 70 % SOLN: 30.0000 mL | Freq: Every day | Status: DC | PRN

## 2014-07-12 MED ORDER — ASPIRIN 81 MG PO CHEW
81.0000 mg | CHEWABLE_TABLET | Freq: Every day | ORAL | Status: DC
  Filled 2014-07-12: qty 1

## 2014-07-12 NOTE — Progress Notes (Signed)
   Subjective:  Patient reports pain as mild.  No events.    Objective:   VITALS:   Filed Vitals:   07/11/14 0500 07/11/14 1300 07/11/14 2028 07/12/14 0515  BP: 142/56 153/63 144/67 154/54  Pulse: 66 64 63 72  Temp: 98.2 F (36.8 C) 98 F (36.7 C) 98 F (36.7 C) 97.8 F (36.6 C)  TempSrc: Oral  Oral   Resp: 20 14 16 16   Weight:      SpO2: 96% 98% 97% 100%    Neurologically intact Neurovascular intact Sensation intact distally Intact pulses distally Dorsiflexion/Plantar flexion intact Incision: dressing C/D/I and no drainage No cellulitis present Compartment soft   Lab Results  Component Value Date   WBC 9.5 07/12/2014   HGB 10.4* 07/12/2014   HCT 32.0* 07/12/2014   MCV 84.9 07/12/2014   PLT 362 07/12/2014     Assessment/Plan:  2 Days Post-Op   - Expected postop acute blood loss anemia - will monitor for symptoms - Up with PT/OT - recommend CIR - DVT ppx - SCDs, ambulation, lovenox - WBAT right lower extremity - posterior hip precautions - Pain control - Discharge planning - CIR pending  Cheral AlmasXu, Ardean Melroy Michael 07/12/2014, 7:36 AM [phone]

## 2014-07-12 NOTE — Care Management Note (Signed)
07/12/14 1500 [name] PeperSusan Shaneka Efaw, RN BSN Case Manager Patient's family requesting a hospital bed.Case manager spoke with Kipp BroodBrent, [employer] liaison concerning  Hospital bed. This is not covered by insurance and will cost $200.00/month for rental. Patient's family is agreeable to paying this. Bed will be delivered by [employer] on Saturday, 07/13/14, family requested an afternoon delivery. Address is [address], [place], [place]. Contact number:[phone].

## 2014-07-12 NOTE — Discharge Summary (Addendum)
Physician Discharge Summary  [name] Fry MRN: [id number] DOB/AGE: 12/01/1919 80 y.o.  PCP: Leonides Sake, MD   Admit date: 07/09/2014 Discharge date: 07/12/2014  Discharge Diagnoses:   Open treatment of femoral fracture, proximal end, neck, prosthetic replacement on 8/19 Acute blood loss anemia   Femoral neck fracture Active Problems:   HTN (hypertension)   HLD (hyperlipidemia)   Closed right hip fracture   Protein-calorie malnutrition, severe    Followup recommendations CBC, BMP in one week    Medication List         aspirin 81 MG chewable tablet  Chew 81 mg by mouth daily.     calcium-vitamin D 500-200 MG-UNIT per tablet  Commonly known as:  OSCAL WITH D  Take 1 tablet by mouth daily with breakfast.     enoxaparin 40 MG/0.4ML injection  Commonly known as:  LOVENOX  Inject 0.4 mLs (40 mg total) into the skin daily.     feeding supplement (ENSURE COMPLETE) Liqd  Take 237 mLs by mouth 2 (two) times daily between meals.     furosemide 20 MG tablet  Commonly known as:  LASIX  Take 20 mg by mouth daily.     hydrALAZINE 50 MG tablet  Commonly known as:  APRESOLINE  Take 50 mg by mouth daily.     HYDROcodone-acetaminophen 5-325 MG per tablet  Commonly known as:  NORCO  Take 1-2 tablets by mouth every 6 (six) hours as needed.     loratadine 10 MG tablet  Commonly known as:  CLARITIN  Take 10 mg by mouth daily.     metoprolol succinate 100 MG 24 hr tablet  Commonly known as:  TOPROL-XL  Take 1 tablet (100 mg total) by mouth daily. Take with or immediately following a meal.     polyethylene glycol packet  Commonly known as:  MIRALAX / GLYCOLAX  Take 17 g by mouth daily as needed for mild constipation.     potassium chloride SA 20 MEQ tablet  Commonly known as:  K-DUR,KLOR-CON  Take 20 mEq by mouth daily.     pravastatin 40 MG tablet  Commonly known as:  PRAVACHOL  Take 40 mg by mouth daily.     promethazine 25 MG tablet  Commonly known as:   PHENERGAN  Take 1 tablet (25 mg total) by mouth every 6 (six) hours as needed for nausea.     senna 8.6 MG Tabs tablet  Commonly known as:  SENOKOT  Take 1 tablet (8.6 mg total) by mouth 2 (two) times daily.     senna-docusate 8.6-50 MG per tablet  Commonly known as:  SENOKOT S  Take 1 tablet by mouth at bedtime as needed.     sorbitol 70 % Soln  Take 30 mLs by mouth daily as needed for moderate constipation.        Discharge Condition:   Disposition: Refusing SNF, therefore being discharged home   Consults: Orthopedics   Significant Diagnostic Studies: Dg Hip 1 View Right  07/10/2014   CLINICAL DATA:  Hip fracture.  EXAM: RIGHT HIP - 1 VIEW  COMPARISON:  Hip series 8/18 2015.  FINDINGS: Again noted is a right femoral neck angulated fracture. Remainder the right femur normal. Degenerative changes right hip and knee.Peripheral vascular calcification. Pelvic phleboliths.  IMPRESSION: 1.  Angulated right hip fracture.  2.  DJD right hip and right knee.  3.  Peripheral vascular disease.   Electronically Signed   By: Marcello Moores  Register   On:  07/10/2014 07:59   Dg Hip Complete Right  07/09/2014   CLINICAL DATA:  Fall, right hip pain.  EXAM: RIGHT HIP - COMPLETE 2+ VIEW  COMPARISON:  None.  FINDINGS: Right femoral neck fracture with impaction, mild varus angulation of the distal bony fragments. Femoral heads are located.  Bone mineral density is decreased without destructive bony lesions. Phleboliths in the pelvis. Periarticular soft tissue planes are nonsuspicious.  IMPRESSION: Impacted mildly angulated right femoral neck fracture without dislocation.   Electronically Signed   By: Elon Alas   On: 07/09/2014 23:58   Dg Pelvis Portable  07/10/2014   CLINICAL DATA:  Postop right hip arthroplasty.  EXAM: PORTABLE PELVIS 1-2 VIEWS  COMPARISON:  07/09/2004 hip radiographs.  FINDINGS: 1748 hr. Interval right hip bipolar hemiarthroplasty. The hardware appears well positioned. There is no  evidence of acute fracture or dislocation. Soft tissue emphysema is present surrounding the right hip joint. Skin staples are in place.  IMPRESSION: No demonstrated complication following right hip hemiarthroplasty.   Electronically Signed   By: Camie Patience M.D.   On: 07/10/2014 17:57   Dg Chest Port 1 View  07/10/2014   CLINICAL DATA:  Check infiltrates.  EXAM: PORTABLE CHEST - 1 VIEW  COMPARISON:  None.  FINDINGS: There is a moderate right pleural effusion which is layering. This obscures the right lower lobe which could contain atelectasis, pneumonia, or even mass. Mild cardiomegaly. Negative aortic contours. No pulmonary edema. No pneumothorax.  IMPRESSION: Moderate right pleural effusion which obscures the right lower lobe. Follow-up PA and lateral chest radiography recommended after convalescence from hip fracture.   Electronically Signed   By: Jorje Guild M.D.   On: 07/10/2014 01:05       Microbiology: Recent Results (from the past 240 hour(s))  URINE CULTURE     Status: None   Collection Time    07/10/14 12:30 AM      Result Value Ref Range Status   Specimen Description URINE, CATHETERIZED   Final   Special Requests NONE   Final   Culture  Setup Time     Final   Value: 07/10/2014 01:23     Performed at [facility]     Final   Value: NO GROWTH     Performed at [facility]   Culture     Final   Value: NO GROWTH     Performed at [facility]   Report Status 07/11/2014 FINAL   Final  SURGICAL PCR SCREEN     Status: None   Collection Time    07/10/14  8:31 AM      Result Value Ref Range Status   MRSA, PCR NEGATIVE  NEGATIVE Final   Staphylococcus aureus NEGATIVE  NEGATIVE Final   Comment:            The Xpert SA Assay (FDA     approved for NASAL specimens     in patients over 72 years of age),     is one component of     a comprehensive surveillance     program.  Test performance has     been validated by [employer] for  patients greater     than or equal to 87 year old.     It is not intended     to diagnose infection nor to     guide or monitor treatment.     Labs: Results for  orders placed during the hospital encounter of 07/09/14 (from the past 48 hour(s))  CBC     Status: Abnormal   Collection Time    07/11/14  5:06 AM      Result Value Ref Range   WBC 9.9  4.0 - 10.5 K/uL   RBC 3.99  3.87 - 5.11 MIL/uL   Hemoglobin 10.9 (*) 12.0 - 15.0 g/dL   HCT 33.0 (*) 36.0 - 46.0 %   MCV 82.7  78.0 - 100.0 fL   MCH 27.3  26.0 - 34.0 pg   MCHC 33.0  30.0 - 36.0 g/dL   RDW 14.0  11.5 - 15.5 %   Platelets 384  150 - 400 K/uL  COMPREHENSIVE METABOLIC PANEL     Status: Abnormal   Collection Time    07/11/14  5:06 AM      Result Value Ref Range   Sodium 136 (*) 137 - 147 mEq/L   Potassium 4.3  3.7 - 5.3 mEq/L   Chloride 101  96 - 112 mEq/L   CO2 25  19 - 32 mEq/L   Glucose, Bld 110 (*) 70 - 99 mg/dL   BUN 11  6 - 23 mg/dL   Creatinine, Ser 0.58  0.50 - 1.10 mg/dL   Calcium 8.3 (*) 8.4 - 10.5 mg/dL   Total Protein 5.8 (*) 6.0 - 8.3 g/dL   Albumin 1.9 (*) 3.5 - 5.2 g/dL   AST 22  0 - 37 U/L   ALT 5  0 - 35 U/L   Alkaline Phosphatase 66  39 - 117 U/L   Total Bilirubin 0.6  0.3 - 1.2 mg/dL   GFR calc non Af Amer 78 (*) >90 mL/min   GFR calc Af Amer 90 (*) >90 mL/min   Comment: (NOTE)     The eGFR has been calculated using the CKD EPI equation.     This calculation has not been validated in all clinical situations.     eGFR's persistently <90 mL/min signify possible Chronic Kidney     Disease.   Anion gap 10  5 - 15  CBC     Status: Abnormal   Collection Time    07/12/14  5:08 AM      Result Value Ref Range   WBC 9.5  4.0 - 10.5 K/uL   RBC 3.77 (*) 3.87 - 5.11 MIL/uL   Hemoglobin 10.4 (*) 12.0 - 15.0 g/dL   HCT 32.0 (*) 36.0 - 46.0 %   MCV 84.9  78.0 - 100.0 fL   MCH 27.6  26.0 - 34.0 pg   MCHC 32.5  30.0 - 36.0 g/dL   RDW 14.1  11.5 - 15.5 %   Platelets 362  150 - 400 K/uL  BASIC METABOLIC  PANEL     Status: Abnormal   Collection Time    07/12/14  5:08 AM      Result Value Ref Range   Sodium 138  137 - 147 mEq/L   Potassium 4.1  3.7 - 5.3 mEq/L   Chloride 104  96 - 112 mEq/L   CO2 25  19 - 32 mEq/L   Glucose, Bld 107 (*) 70 - 99 mg/dL   BUN 16  6 - 23 mg/dL   Creatinine, Ser 0.74  0.50 - 1.10 mg/dL   Calcium 8.1 (*) 8.4 - 10.5 mg/dL   GFR calc non Af Amer 72 (*) >90 mL/min   GFR calc Af Amer 83 (*) >90 mL/min   Comment: (  NOTE)     The eGFR has been calculated using the CKD EPI equation.     This calculation has not been validated in all clinical situations.     eGFR's persistently <90 mL/min signify possible Chronic Kidney     Disease.   Anion gap 9  5 - 15     HPI : 80 y.o. female with h/o HTN, elevated lipids who stumbled and fell while walking outside her home and developed right hip pain. She was evaluated in ED on 07/10/14 and X rays revealed right femoral neck fracture. Dr. Erlinda Hong consulted and patient underwent right hip bipolar arthroplasty on 07/10/14. Post op WBAT with posterior hip precautions and on lovenox for DVT prophylaxis. Noted to have ABLA , moderate right pleural effusion as well as generalized weakness affecting mobility. MD, PT, OT recommending SNF for follow up therapy. , Patient refusing SNF   HOSPITAL COURSE:  Right femoral neck fracture  Status post Open treatment of femoral fracture, proximal end, neck, prosthetic replacement on 8/19 Expected postop acute blood loss anemia  On Lovenox for DVT prophylaxis for 2 weeks PT/OT recommended SNF the patient refusing WBAT right lower extremity  - posterior hip precautions  - Pain control   Acute blood loss anemia  Not a significant drop, no indication for transfusion at this time Repeat CBC in one week Hemoglobin 10.4 at the time of discharge  Hypertension - continue outpatient medications Resuming lasix , hydralazine and PO metoprolol post surgery   Hyperlipidemia - continue statins         Discharge Exam:  Blood pressure 154/54, pulse 72, temperature 97.8 F (36.6 C), temperature source Oral, resp. rate 16, weight 43.954 kg (96 lb 14.4 oz), SpO2 100.00%.  General: alert & oriented x 3 In NAD Cardiovascular: RRR, nl S1 s2 Respiratory: Decreased breath sounds at the bases, scattered rhonchi, no crackles Abdomen: soft +BS NT/ND, no masses palpable Extremities: No cyanosis and no edema        Discharge Instructions   Diet - low sodium heart healthy    Complete by:  As directed      Increase activity slowly    Complete by:  As directed      Weight bearing as tolerated    Complete by:  As directed            Follow-up Information   Follow up with Marianna Payment, MD In 2 weeks. (For suture removal, For wound re-check)    Specialty:  Orthopedic Surgery   Contact information:   [place] [address] [phone]       Follow up with [facility]. (Someone from [facility] will contact you concerning start date and time for physical therapy.)    Contact information:   [place] [address] [phone]       Signed: Reyne Dumas 07/12/2014, 1:11 PM

## 2014-07-12 NOTE — Care Management Note (Signed)
CARE MANAGEMENT NOTE 07/12/2014  Patient:  [name] Fry   Account Number:  [id number]  Date Initiated:  07/11/2014  Documentation initiated by:  Vance PeperBRADY,Naman Spychalski  Subjective/Objective Assessment:   80 yr old female admitted with right hip fracture after fall at home. s/p right total hip arthroplasty.     Action/Plan:   Patient has declined SNF, wants to go home with home health. Choice offered. Referral called to Ayesha RumpfMary Yonjof, [facility]. patient has family support at discharge. Start of care 07/13/14.   Anticipated DC Date:  07/12/2014   Anticipated DC Plan:  HOME W HOME HEALTH SERVICES  In-house referral  Clinical Social Worker      DC Planning Services  CM consult      [facility]  HOME HEALTH  DURABLE MEDICAL EQUIPMENT   Choice offered to / List presented to:  C-1 Patient   DME arranged  [facility]  3-N-1      DME agency  [facility]     HH arranged  HH-2 PT      [facility]   Status of service:  Completed, signed off Medicare Important Message given?  YES (If response is "NO", the following Medicare IM given date fields will be blank) Date Medicare IM given:  07/11/2014 Medicare IM given by:  Vance PeperBRADY,Medard Decuir Date Additional Medicare IM given:   Additional Medicare IM given by:    Discharge Disposition:  HOME W HOME HEALTH SERVICES  Per UR Regulation:  Reviewed for med. necessity/level of care/duration of stay

## 2014-07-12 NOTE — Progress Notes (Addendum)
Pt to discharge home today. SW spoke with pt.'s oldest daughter, Filbert Bertholdlice Lewis [phone]) [place]. [name] daughter is in agreement with pt to return home. Will transport via [facility]. Home health has been arranged by CM.  Derrell Lollingoris Ermine Spofford, MSW Clinical Social Worker [phone]

## 2014-07-12 NOTE — Social Work (Signed)
Clinical Social Work Department BRIEF PSYCHOSOCIAL ASSESSMENT 07/12/2014  Patient:  Fry,[name]     Account Number:  [id number]     Admit date:  09/25/2005  Clinical Social Worker:  Cristy FolksBEST,Iker Nuttall, LCSW  Date/Time:  07/11/2014 12:11 PM  Referred by:  Physician  Date Referred:  07/11/2014 Referred for  SNF Placement vs home   Other Referral:   Interview type:  Patient Other interview type:   family    PSYCHOSOCIAL DATA Living Status:  FAMILY Admitted from facility:  N/A Level of care:  N/A Primary support name:  [name] Fry [phone] Primary support relationship to patient:  CHILD, ADULT Degree of support available:   Good Support    CURRENT CONCERNS  Other Concerns:    SOCIAL WORK ASSESSMENT / PLAN Pt admitted to hospital on 07/09/14 due to a fall while getting out of the car. Pt lives with an adult daughter who is very supportive. SW  and CM, Vance PeperSusan Brady spoke with pt and pt.'s family about SNF vs home. Pt reports that she would like to return home and receive in home therapy. Family discussed this matter and pt will return home.   Assessment/plan status:  No Further Intervention Required Other assessment/ plan:   N/A   Information/referral to community resources:   N/A    PATIENT'S/FAMILY'S RESPONSE TO PLAN OF CARE: SW role explained and pt/family voiced understanding. Pt awaiting disposition.    [name] Fry, MSW Clinical Social Worker [phone]

## 2014-07-12 NOTE — Progress Notes (Signed)
Physical Therapy Treatment Patient Details Name: [name] Fry MRN: [id number] DOB: 08-Sep-1920 Today's Date: 07/12/2014    History of Present Illness 80 y.o. female s/p right ARTHROPLASTY BIPOLAR HIP. Hx of HTN    PT Comments    Patient very sweet and working with therapy as able. Limited by pain and general weakness this AM. Patient was abel to incorporate LE therex this session. Continue to recommend CIR to decrease burden of care on daughter once she returns home.   Follow Up Recommendations  CIR     Equipment Recommendations  Rolling walker with 5" wheels;3in1 (PT)    Recommendations for Other Services       Precautions / Restrictions Precautions Precautions: Posterior Hip;Fall Precaution Comments: Reviewed hip precautions with patient Restrictions RLE Weight Bearing: Weight bearing as tolerated    Mobility  Bed Mobility Overal bed mobility: Needs Assistance Bed Mobility: Sit to Supine     Supine to sit: Mod assist     General bed mobility comments: A for trunk and LE positioning out of bed. Cues for technique  Transfers Overall transfer level: Needs assistance Equipment used: Rolling walker (2 wheeled)   Sit to Stand: Mod assist         General transfer comment: Mod A to stand this session. A to power up as patient with increase pain this session. Cues for hand placement and to ensure safety when backing up  Ambulation/Gait Ambulation/Gait assistance: Mod assist Ambulation Distance (Feet): 4 Feet Assistive device: Rolling walker (2 wheeled) Gait Pattern/deviations: Step-to pattern;Decreased weight shift to right;Decreased stance time - right   Gait velocity interpretation: <1.8 ft/sec, indicative of risk for recurrent falls General Gait Details: Cues for gait sequency and positioning of RW. A for balance and weight shifting. Only tolerating min weight through R LE this session   Stairs            Wheelchair Mobility    Modified Rankin (Stroke  Patients Only)       Balance                                    Cognition Arousal/Alertness: Awake/alert Behavior During Therapy: WFL for tasks assessed/performed Overall Cognitive Status: Within Functional Limits for tasks assessed       Memory: Decreased recall of precautions              Exercises Total Joint Exercises Quad Sets: Both;10 reps;Seated;Strengthening Gluteal Sets: Both;10 reps;AROM Heel Slides: AAROM;Right;10 reps Hip ABduction/ADduction: AAROM;Right;10 reps Long Arc Quad: AAROM;Right;10 reps    General Comments        Pertinent Vitals/Pain Pain Assessment: Faces Faces Pain Scale: Hurts little more Pain Location: R hip Pain Descriptors / Indicators: Aching Pain Intervention(s): Monitored during session;Limited activity within patient's tolerance    Home Living                      Prior Function            PT Goals (current goals can now be found in the care plan section) Progress towards PT goals: Progressing toward goals    Frequency  Min 5X/week    PT Plan Current plan remains appropriate    Co-evaluation             End of Session Equipment Utilized During Treatment: Gait belt Activity Tolerance: Patient tolerated treatment well;Patient limited by pain Patient left: in chair;with call  bell/phone within reach     Time: 0858-0928 PT Time Calculation (min): 30 min  Charges:  $Gait Training: 8-22 mins $Therapeutic Exercise: 8-22 mins                    G Codes:      Fredrich Birks 07/12/2014, 9:37 AM 07/12/2014 Fredrich Birks PTA [phone] pager [phone] office

## 2014-08-15 ENCOUNTER — Other Ambulatory Visit: Payer: Self-pay | Admitting: Physician Assistant

## 2014-08-15 DIAGNOSIS — E2839 Other primary ovarian failure: Secondary | ICD-10-CM

## 2014-08-29 ENCOUNTER — Ambulatory Visit
Admission: RE | Admit: 2014-08-29 | Discharge: 2014-08-29 | Disposition: A | Discharge status: Home / Self Care | Payer: Medicare Other | Source: Ambulatory Visit | Attending: Physician Assistant | Admitting: Physician Assistant

## 2014-08-29 DIAGNOSIS — E2839 Other primary ovarian failure: Secondary | ICD-10-CM

## 2014-10-08 ENCOUNTER — Other Ambulatory Visit: Payer: Self-pay | Admitting: Internal Medicine

## 2014-12-22 ENCOUNTER — Encounter (HOSPITAL_COMMUNITY): Payer: Self-pay | Admitting: Emergency Medicine

## 2014-12-22 ENCOUNTER — Emergency Department (HOSPITAL_COMMUNITY): Payer: PPO

## 2014-12-22 ENCOUNTER — Observation Stay (HOSPITAL_COMMUNITY)
Admission: EM | Admit: 2014-12-22 | Discharge: 2014-12-23 | Disposition: A | Discharge status: Home / Self Care | Payer: PPO | Attending: Internal Medicine | Admitting: Internal Medicine

## 2014-12-22 DIAGNOSIS — R4701 Aphasia: Secondary | ICD-10-CM | POA: Insufficient documentation

## 2014-12-22 DIAGNOSIS — D649 Anemia, unspecified: Secondary | ICD-10-CM | POA: Diagnosis present

## 2014-12-22 DIAGNOSIS — D72829 Elevated white blood cell count, unspecified: Secondary | ICD-10-CM | POA: Diagnosis not present

## 2014-12-22 DIAGNOSIS — I161 Hypertensive emergency: Secondary | ICD-10-CM

## 2014-12-22 DIAGNOSIS — Z8673 Personal history of transient ischemic attack (TIA), and cerebral infarction without residual deficits: Secondary | ICD-10-CM

## 2014-12-22 DIAGNOSIS — Z8709 Personal history of other diseases of the respiratory system: Secondary | ICD-10-CM

## 2014-12-22 DIAGNOSIS — M810 Age-related osteoporosis without current pathological fracture: Secondary | ICD-10-CM | POA: Diagnosis not present

## 2014-12-22 DIAGNOSIS — E780 Pure hypercholesterolemia: Secondary | ICD-10-CM | POA: Insufficient documentation

## 2014-12-22 DIAGNOSIS — E559 Vitamin D deficiency, unspecified: Secondary | ICD-10-CM | POA: Diagnosis present

## 2014-12-22 DIAGNOSIS — Z7982 Long term (current) use of aspirin: Secondary | ICD-10-CM | POA: Insufficient documentation

## 2014-12-22 DIAGNOSIS — G459 Transient cerebral ischemic attack, unspecified: Secondary | ICD-10-CM | POA: Diagnosis not present

## 2014-12-22 DIAGNOSIS — G458 Other transient cerebral ischemic attacks and related syndromes: Secondary | ICD-10-CM

## 2014-12-22 DIAGNOSIS — E785 Hyperlipidemia, unspecified: Secondary | ICD-10-CM | POA: Diagnosis not present

## 2014-12-22 DIAGNOSIS — J309 Allergic rhinitis, unspecified: Secondary | ICD-10-CM | POA: Diagnosis present

## 2014-12-22 DIAGNOSIS — I10 Essential (primary) hypertension: Secondary | ICD-10-CM | POA: Diagnosis not present

## 2014-12-22 HISTORY — DX: Pure hypercholesterolemia, unspecified: E78.00

## 2014-12-22 HISTORY — DX: Cerebral infarction, unspecified: I63.9

## 2014-12-22 LAB — COMPREHENSIVE METABOLIC PANEL
ALBUMIN: 3.4 g/dL — AB (ref 3.5–5.2)
ALT: 9 U/L (ref 0–35)
AST: 22 U/L (ref 0–37)
Alkaline Phosphatase: 63 U/L (ref 39–117)
Anion gap: 10 (ref 5–15)
BILIRUBIN TOTAL: 1.3 mg/dL — AB (ref 0.3–1.2)
BUN: 7 mg/dL (ref 6–23)
CHLORIDE: 94 mmol/L — AB (ref 96–112)
CO2: 29 mmol/L (ref 19–32)
Calcium: 9.3 mg/dL (ref 8.4–10.5)
Creatinine, Ser: 0.67 mg/dL (ref 0.50–1.10)
GFR calc Af Amer: 86 mL/min — ABNORMAL LOW (ref >90)
GFR calc non Af Amer: 74 mL/min — ABNORMAL LOW (ref >90)
Glucose, Bld: 126 mg/dL — ABNORMAL HIGH (ref 70–99)
POTASSIUM: 4 mmol/L (ref 3.5–5.1)
SODIUM: 133 mmol/L — AB (ref 135–145)
TOTAL PROTEIN: 7.5 g/dL (ref 6.0–8.3)

## 2014-12-22 LAB — RAPID URINE DRUG SCREEN, HOSP PERFORMED
AMPHETAMINES: NOT DETECTED
BARBITURATES: NOT DETECTED
BENZODIAZEPINES: NOT DETECTED
COCAINE: NOT DETECTED
OPIATES: POSITIVE — AB
TETRAHYDROCANNABINOL: NOT DETECTED

## 2014-12-22 LAB — URINALYSIS, ROUTINE W REFLEX MICROSCOPIC
BILIRUBIN URINE: NEGATIVE
Glucose, UA: NEGATIVE mg/dL
Hgb urine dipstick: NEGATIVE
Ketones, ur: NEGATIVE mg/dL
Leukocytes, UA: NEGATIVE
NITRITE: NEGATIVE
PH: 7.5 (ref 5.0–8.0)
Protein, ur: 30 mg/dL — AB
Specific Gravity, Urine: 1.014 (ref 1.005–1.030)
UROBILINOGEN UA: 0.2 mg/dL (ref 0.0–1.0)

## 2014-12-22 LAB — CBC
HCT: 40.9 % (ref 36.0–46.0)
Hemoglobin: 13.3 g/dL (ref 12.0–15.0)
MCH: 26.1 pg (ref 26.0–34.0)
MCHC: 32.5 g/dL (ref 30.0–36.0)
MCV: 80.2 fL (ref 78.0–100.0)
Platelets: 374 10*3/uL (ref 150–400)
RBC: 5.1 MIL/uL (ref 3.87–5.11)
RDW: 15.8 % — ABNORMAL HIGH (ref 11.5–15.5)
WBC: 11.3 10*3/uL — AB (ref 4.0–10.5)

## 2014-12-22 LAB — DIFFERENTIAL
BASOS PCT: 1 % (ref 0–1)
Basophils Absolute: 0.1 10*3/uL (ref 0.0–0.1)
EOS ABS: 0.3 10*3/uL (ref 0.0–0.7)
Eosinophils Relative: 3 % (ref 0–5)
LYMPHS PCT: 28 % (ref 12–46)
Lymphs Abs: 3.2 10*3/uL (ref 0.7–4.0)
MONOS PCT: 9 % (ref 3–12)
Monocytes Absolute: 1 10*3/uL (ref 0.1–1.0)
Neutro Abs: 6.8 10*3/uL (ref 1.7–7.7)
Neutrophils Relative %: 59 % (ref 43–77)

## 2014-12-22 LAB — ETHANOL

## 2014-12-22 LAB — I-STAT CHEM 8, ED
BUN: 8 mg/dL (ref 6–23)
CHLORIDE: 96 mmol/L (ref 96–112)
CREATININE: 0.7 mg/dL (ref 0.50–1.10)
Calcium, Ion: 1.12 mmol/L — ABNORMAL LOW (ref 1.13–1.30)
Glucose, Bld: 123 mg/dL — ABNORMAL HIGH (ref 70–99)
HCT: 48 % — ABNORMAL HIGH (ref 36.0–46.0)
Hemoglobin: 16.3 g/dL — ABNORMAL HIGH (ref 12.0–15.0)
POTASSIUM: 4 mmol/L (ref 3.5–5.1)
Sodium: 136 mmol/L (ref 135–145)
TCO2: 25 mmol/L (ref 0–100)

## 2014-12-22 LAB — APTT: APTT: 35 s (ref 24–37)

## 2014-12-22 LAB — URINE MICROSCOPIC-ADD ON

## 2014-12-22 LAB — I-STAT TROPONIN, ED: Troponin i, poc: 0.01 ng/mL (ref 0.00–0.08)

## 2014-12-22 LAB — PROTIME-INR
INR: 1.21 (ref 0.00–1.49)
Prothrombin Time: 15.5 seconds — ABNORMAL HIGH (ref 11.6–15.2)

## 2014-12-22 MED ORDER — SODIUM CHLORIDE 0.9 % IJ SOLN
3.0000 mL | Freq: Two times a day (BID) | INTRAMUSCULAR | Status: DC
  Administered 2014-12-22: 3 mL via INTRAVENOUS

## 2014-12-22 MED ORDER — PRAVASTATIN SODIUM 40 MG PO TABS
40.0000 mg | ORAL_TABLET | Freq: Every day | ORAL | Status: DC
  Administered 2014-12-22 – 2014-12-23 (×2): 40 mg via ORAL
  Filled 2014-12-22 (×2): qty 1

## 2014-12-22 MED ORDER — ENOXAPARIN SODIUM 40 MG/0.4ML ~~LOC~~ SOLN
40.0000 mg | SUBCUTANEOUS | Status: DC
Start: 2014-12-22 — End: 2014-12-23
  Administered 2014-12-22: 40 mg via SUBCUTANEOUS
  Filled 2014-12-22: qty 0.4

## 2014-12-22 MED ORDER — ASPIRIN 325 MG PO TABS
325.0000 mg | ORAL_TABLET | Freq: Every day | ORAL | Status: DC
  Administered 2014-12-22 – 2014-12-23 (×2): 325 mg via ORAL
  Filled 2014-12-22 (×2): qty 1

## 2014-12-22 MED ORDER — LABETALOL HCL 5 MG/ML IV SOLN
20.0000 mg | INTRAVENOUS | Status: DC | PRN
  Administered 2014-12-22: 20 mg via INTRAVENOUS
  Filled 2014-12-22: qty 4

## 2014-12-22 MED ORDER — SODIUM CHLORIDE 0.9 % IV SOLN
INTRAVENOUS | Status: AC
  Administered 2014-12-22: 1000 mL via INTRAVENOUS

## 2014-12-22 MED ORDER — FLUTICASONE PROPIONATE 50 MCG/ACT NA SUSP
2.0000 | Freq: Every day | NASAL | Status: DC
  Administered 2014-12-22 – 2014-12-23 (×2): 2 via NASAL
  Filled 2014-12-22: qty 16

## 2014-12-22 MED ORDER — ONDANSETRON HCL 4 MG/2ML IJ SOLN
4.0000 mg | Freq: Once | INTRAMUSCULAR | Status: AC
  Administered 2014-12-22: 4 mg via INTRAVENOUS
  Filled 2014-12-22: qty 2

## 2014-12-22 MED ORDER — CALCIUM CARBONATE-VITAMIN D 500-200 MG-UNIT PO TABS
1.0000 | ORAL_TABLET | Freq: Every day | ORAL | Status: DC
  Administered 2014-12-23: 1 via ORAL
  Filled 2014-12-22: qty 1

## 2014-12-22 MED ORDER — POLYETHYLENE GLYCOL 3350 17 G PO PACK: 17.0000 g | PACK | Freq: Every day | ORAL | Status: DC | PRN

## 2014-12-22 MED ORDER — LORATADINE 10 MG PO TABS
10.0000 mg | ORAL_TABLET | Freq: Every day | ORAL | Status: DC
  Administered 2014-12-22 – 2014-12-23 (×2): 10 mg via ORAL
  Filled 2014-12-22 (×3): qty 1

## 2014-12-22 MED ORDER — ACETAMINOPHEN 325 MG PO TABS
650.0000 mg | ORAL_TABLET | Freq: Four times a day (QID) | ORAL | Status: DC | PRN
  Administered 2014-12-22: 650 mg via ORAL
  Filled 2014-12-22: qty 2

## 2014-12-22 NOTE — H&P (Signed)
Date: 12/22/2014               Patient Name:  [name] Fry MRN: [id number]  DOB: [birth year] Age / Sex: 81 y.o., female   PCP: Leonides Sake, MD         Medical Service: Internal Medicine Teaching Service         Attending Physician: Dr. Karren Cobble, MD    First Contact: Primus Bravo, MS3 Dr. Karle Starch Moding Pager: [phone]  Second Contact: Dr. Juluis Mire Pager: [phone]       After Hours (After 5p/  First Contact Pager: [phone]  weekends / holidays): Second Contact Pager: [phone]   Chief Complaint: aphasia  History of Present Illness: [name] Fry is a 89HW female with PMHx of HTN, HLD, and CVA (2006) who presented to the ED with aphasia. History was provided by her 2 daughters and granddaughter, who was present at time of event. This morning, [name] Fry woke up around 4:30AM and was talking to her granddaughter. She was showing no symptoms at this time, and went back to sleep shortly thereafter. When she woke up at 7:15, she was rambling and her sentences were not making sense. Granddaughter reports that she would speak and shake her head as if confused or aware that she was not speaking clearly. Speech was clear at first, but she gradually began to [place].  At this time, there was no facial droop or other noted paralysis, no tongue biting, incontinence, or shaking. EMS arrived and found her systolic BP to be 388 prior to transport to ED. [name] Fry had not eaten breakfast nor taken her meds at the time of the event. Prior CVA in 2006 was quickly recognized and treated with TPA. She has no residual effects or deficits.  In the ED,  CT and MRI were negative for acute changes. Aphasia symptoms gradually improved, and family reports that she is nearly back to baseline speech. Neurology was consulted and recommended stroke workup. Labetalol was given for elevated BPs.  Meds: Current Facility-Administered Medications  Medication Dose Route Frequency Provider Last  Rate Last Dose  . 0.9 %  sodium chloride infusion   Intravenous STAT Carmin Muskrat, MD      . aspirin tablet 325 mg  325 mg Oral Daily Juluis Mire, MD      . Derrill Memo ON 12/23/2014] calcium-vitamin D (OSCAL WITH D) 500-200 MG-UNIT per tablet 1 tablet  1 tablet Oral Q breakfast Marjan Rabbani, MD      . enoxaparin (LOVENOX) injection 40 mg  40 mg Subcutaneous Q24H Marjan Rabbani, MD      . fluticasone (FLONASE) 50 MCG/ACT nasal spray 2 spray  2 spray Each Nare Daily Marjan Rabbani, MD      . loratadine (CLARITIN) tablet 10 mg  10 mg Oral Daily Marjan Rabbani, MD      . polyethylene glycol (MIRALAX / GLYCOLAX) packet 17 g  17 g Oral Daily PRN Marjan Rabbani, MD      . pravastatin (PRAVACHOL) tablet 40 mg  40 mg Oral Daily Marjan Rabbani, MD      . sodium chloride 0.9 % injection 3 mL  3 mL Intravenous Q12H Juluis Mire, MD        Allergies: Allergies as of 12/22/2014  . (No Known Allergies)   Past Medical History  Diagnosis Date  . Hypertension   . High cholesterol   . Stroke    Past Surgical History  Procedure Laterality Date  .  Hernia repair    . Hip arthroplasty Right 07/10/2014    Procedure: ARTHROPLASTY BIPOLAR HIP;  Surgeon: Marianna Payment, MD;  Location: [facility];  Service: Orthopedics;  Laterality: Right;   Family History  Problem Relation Age of Onset  . CAD Neg Hx   . Dementia Other    Social History: [name] Fry is widowed and currently lives with her daughter. She is able to ambulate with a walker, dresses herself, and bathes herself. She eats a full, regular diet at home. She does not use tobacco, alcohol, or recreational drugs. She spends much of the day watching TV.    Review of Systems: General: normal appetite, no weight change, denies fevers HEENT: positive for headache, blurry vision. Negative for dysphagia Cardiac: negative for CP Respiratory: negative for SOB, cough GI: some "queasiness" yesterday (other family members have stomach virus); negative for  nausea, vomiting, diarrhea, bloody/dark stools Neurologic: positive for speech change. Negative for seizure-like activity, tongue biting, shaking Musculoskeletal: negative for weakness. Hip fracture s/p partial replacement in August 2015 Vascular: lower extremity edema   Physical Exam: Blood pressure 172/78, pulse 83, temperature 99.6 F (37.6 C), temperature source Oral, resp. rate 20, SpO2 96 %.   General: lying in bed, NAD. Alert and cooperative HEENT: PERRL, EOMI, anicteric sclera, oropharynx clear, MMM Heart: systolic ejection murmur at RUSB, regular rate Lungs: CTAB, no wheezes, normal work of breathing Abd: soft, nontender, nondistended, normoactive bowel sounds Ext: no pedal edema, warm/well-perfused Neuro: CN II-XII grossly intact, 4/5 upper and lower extremity strength, 2+ grip strength, normal finger-nose-finger movements, downgoing Babinski, AOx3 (answered place/time properly when given choices)   Lab results: Recent Results (from the past 2160 hour(s))  Ethanol     Status: None   Collection Time: 12/22/14  8:24 AM  Result Value Ref Range   Alcohol, Ethyl (B) <5 0 - 9 mg/dL    Comment:        LOWEST DETECTABLE LIMIT FOR SERUM ALCOHOL IS 11 mg/dL FOR MEDICAL PURPOSES ONLY   Protime-INR     Status: Abnormal   Collection Time: 12/22/14  8:24 AM  Result Value Ref Range   Prothrombin Time 15.5 (H) 11.6 - 15.2 seconds   INR 1.21 0.00 - 1.49  APTT     Status: None   Collection Time: 12/22/14  8:24 AM  Result Value Ref Range   aPTT 35 24 - 37 seconds  CBC     Status: Abnormal   Collection Time: 12/22/14  8:24 AM  Result Value Ref Range   WBC 11.3 (H) 4.0 - 10.5 K/uL   RBC 5.10 3.87 - 5.11 MIL/uL   Hemoglobin 13.3 12.0 - 15.0 g/dL   HCT 40.9 36.0 - 46.0 %   MCV 80.2 78.0 - 100.0 fL   MCH 26.1 26.0 - 34.0 pg   MCHC 32.5 30.0 - 36.0 g/dL   RDW 15.8 (H) 11.5 - 15.5 %   Platelets 374 150 - 400 K/uL  Differential     Status: None   Collection Time: 12/22/14  8:24 AM    Result Value Ref Range   Neutrophils Relative % 59 43 - 77 %   Neutro Abs 6.8 1.7 - 7.7 K/uL   Lymphocytes Relative 28 12 - 46 %   Lymphs Abs 3.2 0.7 - 4.0 K/uL   Monocytes Relative 9 3 - 12 %   Monocytes Absolute 1.0 0.1 - 1.0 K/uL   Eosinophils Relative 3 0 - 5 %   Eosinophils Absolute 0.3  0.0 - 0.7 K/uL   Basophils Relative 1 0 - 1 %   Basophils Absolute 0.1 0.0 - 0.1 K/uL  Comprehensive metabolic panel     Status: Abnormal   Collection Time: 12/22/14  8:24 AM  Result Value Ref Range   Sodium 133 (L) 135 - 145 mmol/L   Potassium 4.0 3.5 - 5.1 mmol/L   Chloride 94 (L) 96 - 112 mmol/L   CO2 29 19 - 32 mmol/L   Glucose, Bld 126 (H) 70 - 99 mg/dL   BUN 7 6 - 23 mg/dL   Creatinine, Ser 0.67 0.50 - 1.10 mg/dL   Calcium 9.3 8.4 - 10.5 mg/dL   Total Protein 7.5 6.0 - 8.3 g/dL   Albumin 3.4 (L) 3.5 - 5.2 g/dL   AST 22 0 - 37 U/L   ALT 9 0 - 35 U/L   Alkaline Phosphatase 63 39 - 117 U/L   Total Bilirubin 1.3 (H) 0.3 - 1.2 mg/dL   GFR calc non Af Amer 74 (L) >90 mL/min   GFR calc Af Amer 86 (L) >90 mL/min    Comment: (NOTE) The eGFR has been calculated using the CKD EPI equation. This calculation has not been validated in all clinical situations. eGFR's persistently <90 mL/min signify possible Chronic Kidney Disease.    Anion gap 10 5 - 15  I-Stat Troponin, ED (not at [facility])     Status: None   Collection Time: 12/22/14  8:34 AM  Result Value Ref Range   Troponin i, poc 0.01 0.00 - 0.08 ng/mL   Comment 3            Comment: Due to the release kinetics of cTnI, a negative result within the first hours of the onset of symptoms does not rule out myocardial infarction with certainty. If myocardial infarction is still suspected, repeat the test at appropriate intervals.   I-Stat Chem 8, ED     Status: Abnormal   Collection Time: 12/22/14  8:35 AM  Result Value Ref Range   Sodium 136 135 - 145 mmol/L   Potassium 4.0 3.5 - 5.1 mmol/L   Chloride 96 96 - 112 mmol/L   BUN 8 6 - 23  mg/dL   Creatinine, Ser 0.70 0.50 - 1.10 mg/dL   Glucose, Bld 123 (H) 70 - 99 mg/dL   Calcium, Ion 1.12 (L) 1.13 - 1.30 mmol/L   TCO2 25 0 - 100 mmol/L   Hemoglobin 16.3 (H) 12.0 - 15.0 g/dL   HCT 48.0 (H) 36.0 - 46.0 %  Urine Drug Screen     Status: Abnormal   Collection Time: 12/22/14  9:03 AM  Result Value Ref Range   Opiates POSITIVE (A) NONE DETECTED   Cocaine NONE DETECTED NONE DETECTED   Benzodiazepines NONE DETECTED NONE DETECTED   Amphetamines NONE DETECTED NONE DETECTED   Tetrahydrocannabinol NONE DETECTED NONE DETECTED   Barbiturates NONE DETECTED NONE DETECTED    Comment:        DRUG SCREEN FOR MEDICAL PURPOSES ONLY.  IF CONFIRMATION IS NEEDED FOR ANY PURPOSE, NOTIFY LAB WITHIN 5 DAYS.        LOWEST DETECTABLE LIMITS FOR URINE DRUG SCREEN Drug Class       Cutoff (ng/mL) Amphetamine      1000 Barbiturate      200 Benzodiazepine   169 Tricyclics       678 Opiates          300 Cocaine  300 THC              50   Urinalysis, Routine w reflex microscopic     Status: Abnormal   Collection Time: 12/22/14  9:03 AM  Result Value Ref Range   Color, Urine YELLOW YELLOW   APPearance CLEAR CLEAR   Specific Gravity, Urine 1.014 1.005 - 1.030   pH 7.5 5.0 - 8.0   Glucose, UA NEGATIVE NEGATIVE mg/dL   Hgb urine dipstick NEGATIVE NEGATIVE   Bilirubin Urine NEGATIVE NEGATIVE   Ketones, ur NEGATIVE NEGATIVE mg/dL   Protein, ur 30 (A) NEGATIVE mg/dL   Urobilinogen, UA 0.2 0.0 - 1.0 mg/dL   Nitrite NEGATIVE NEGATIVE   Leukocytes, UA NEGATIVE NEGATIVE  Urine microscopic-add on     Status: Abnormal   Collection Time: 12/22/14  9:03 AM  Result Value Ref Range   Squamous Epithelial / LPF RARE RARE   WBC, UA 0-2 <3 WBC/hpf   RBC / HPF 0-2 <3 RBC/hpf   Bacteria, UA FEW (A) RARE    Imaging results:  Ct Head Wo Contrast  12/22/2014   CLINICAL DATA:  Severe acute headache.  Intermittent aphasia.  EXAM: CT HEAD WITHOUT CONTRAST  TECHNIQUE: Contiguous axial images were  obtained from the base of the skull through the vertex without intravenous contrast.  COMPARISON:  None.  FINDINGS: Bony calvarium appears intact. Mild diffuse cortical atrophy is noted. Mild chronic ischemic white matter disease is noted. No mass effect or midline shift is noted. Ventricular size is within normal limits. There is no evidence of mass lesion, hemorrhage or acute infarction.  IMPRESSION: Mild diffuse cortical atrophy. Mild chronic ischemic white matter disease. No acute intracranial abnormality seen. Critical Value/emergent results were called by telephone at the time of interpretation on 12/22/2014 at 8:46 am to Dr. Leonel Ramsay, who verbally acknowledged these results.   Electronically Signed   By: Sabino Dick M.D.   On: 12/22/2014 08:49   Mr Brain Wo Contrast  12/22/2014   CLINICAL DATA:  Speech disturbance beginning 430 this morning. Headache. Blurred vision.  EXAM: MRI HEAD WITHOUT CONTRAST  TECHNIQUE: Multiplanar, multiecho pulse sequences of the brain and surrounding structures were obtained without intravenous contrast.  COMPARISON:  Head CT same day  FINDINGS: Diffusion imaging does not show any acute or subacute infarction. The brainstem and cerebellum are unremarkable. The cerebral hemispheres show advanced chronic small vessel change throughout the deep and subcortical hemispheric white matter. No cortical or large vessel territory infarction. No mass lesion, hemorrhage, hydrocephalus or extra-axial collection. No pituitary mass. No inflammatory sinus disease. No skull or skullbase lesion. Major vessels at the base of the brain show flow.  IMPRESSION: No acute finding. Advanced chronic small vessel ischemic changes throughout the cerebral hemispheric white matter.   Electronically Signed   By: Nelson Chimes M.D.   On: 12/22/2014 11:37    Other results: EKG Interpretation  Date/Time:  Sunday December 22 2014 08:46:18 EST Ventricular Rate:  84 PR Interval:  172 QRS Duration: 134 QT  Interval:  447 QTC Calculation: 528 R Axis:   20 Text Interpretation:  Sinus rhythm Left atrial enlargement Right bundle branch block Left ventricular hypertrophy Repol abnrm suggests ischemia, diffuse leads Prolonged QT interval Sinus rhythm ST-t wave abnormality T wave abnormality Artifact Abnormal ekg Confirmed by Carmin Muskrat  MD ([id number]) on 12/22/2014 9:33:48 AM   Assessment & Plan by Problem: Principal Problem:   TIA (transient ischemic attack) Active Problems:   HTN (hypertension)   HLD (  hyperlipidemia)   Aphasia   History of CVA (cerebrovascular accident) without residual deficits   Vitamin D deficiency   Allergic rhinitis   Osteoporosis  [name] Fry is a 88OY female with PMHx of HTN, HLD, and CVA (2006) who presented to the ED with aphasia. Likely TIA based on history and gradual improvement of symptoms. Neuro consulted: suspected temporal lobe ischemia and recommenedd stroke workup. Differential includes TIA, delirium, and seizure. Delirium less likely given no noted history of delirium, dementia, or potentially inciting events. Seizure less likely given negative history or epilepsy or seizure symptoms.  Aphasia: Likely due to TIA due to acute onset with gradual improvement as well as pre-existing risk factors of HLD, HTN, prior CVA. CT/MRI showed no acute findings. Pt was taking daily baby aspirin prior to event.  Pt nearly back to baseline speech per family. -- obtain MRA -- obtain echo, carotid dopplers, HbA1c, TSH -- q2H neuro checks for 6 hours, then reduce to q4H -- PT/OT consult -- Aspirin 350m -- continuous cardiac monitoring + pulse ox    Hypertension: Treated at home with 2057mmetoprolol, 5070mydralazine, and 23m26msix. Systolic BP 250 241ay per EMS, but pt had not yet taken meds this morning. Pt treated with labetalol in ED; systolic BP now [phone]- q2H vitals for 6 hours, then reduce to routine checks --  Consider continuing home meds pending  BP  Hyperlipidemia -- obtain lipid panel -- continue 23mg21mvastatin  FEN/GI: -- Heart-healthy diet -- continuous IV NS @ 50 cc/hr x 12 hours -- continue calcium-vitamin D -- continue home miralax  PPx: -- Lovenox  Code status: FULL CODE  Dispo: Disposition is deferred at this time, awaiting improvement of current medical problems.   The patient does have a current PCP (MauraLeonides Sake [facility] TBD.  The patient does not know have transportation limitations that hinder transportation to clinic appointments.  Signed: BenjaLouisa Second Student 12/22/2014, 5:31 PM

## 2014-12-22 NOTE — ED Notes (Signed)
Internal Medicine at bedside.  

## 2014-12-22 NOTE — ED Notes (Signed)
  Pt family in waiting area, would like to have update when pt is admitted.

## 2014-12-22 NOTE — H&P (Signed)
Date: 12/22/2014               Patient Name:  [name] Fry MRN: [id number]  DOB: [birth year] Age / Sex: 81 y.o., female   PCP: Ailene Ravel, MD         Medical Service: Internal Medicine Teaching Service         Attending Physician: Dr. Rocco Serene, MD    First Contact: [name] Fry, MS3 Dr. Mitzie Na Moding Pager: [phone]  Second Contact: Dr. Otis Brace Pager: [phone]       After Hours (After 5p/  First Contact Pager: [phone]  weekends / holidays): Second Contact Pager: [phone]   Chief Complaint: aphasia  History of Present Illness: [name] Fry is a 81 year old female with HTN, HLD, prior CVA [~2006] who presents with aphasia. As the patient was somewhat tired-appearing on exam, her 2 daughters and granddaughter contributed to the interview.   This morning around 430, the patient and her granddaughter were up talking and then both went back to sleep. Around 730, the patient was noted to be rambling and referring to a grandbaby in a disoriented manner. She attempted this twice in the form of a conversation before stopping altogether. EMS was then called who reported she had systolic BP in the 250s for which she was given boluses of IV labetalol. The patient reports having a headache but family members deny any reported weakness, numbness, facial drooping, tongue biting, urinary/fecal incontinence, weakness, nausea, vomiting, diarrhea, change in PO intake though has been feeling queasy in her stomach with several family members at home having GI illness. She had a prior CVA in 2006 for which she received tPA and has been on ASA 81mg  since. At baseline, she is able to ambulate around the home with a walker, dress, bathe, and toilet independently though lives with a daughter.   In the ED, Neurology was consulted and recommended TIA workup. Head CT & brain MRI were unremarkable for acute infarct.      Meds: Current Facility-Administered Medications  Medication Dose  Route Frequency Provider Last Rate Last Dose  . labetalol (NORMODYNE,TRANDATE) injection 20 mg  20 mg Intravenous Q2H PRN Gerhard Munch, MD   20 mg at 12/22/14 1216   Current Outpatient Prescriptions  Medication Sig Dispense Refill  . acetaminophen (TYLENOL) 325 MG tablet Take 650 mg by mouth every 6 (six) hours as needed for mild pain or moderate pain.    Marland Kitchen aspirin 81 MG chewable tablet Chew 81 mg by mouth daily.    . calcium-vitamin D (OSCAL WITH D) 500-200 MG-UNIT per tablet Take 1 tablet by mouth daily with breakfast. 42 tablet 0  . feeding supplement, ENSURE COMPLETE, (ENSURE COMPLETE) LIQD Take 237 mLs by mouth 2 (two) times daily between meals. 100 Bottle 2  . furosemide (LASIX) 20 MG tablet Take 20 mg by mouth daily.    . hydrALAZINE (APRESOLINE) 50 MG tablet Take 75 mg by mouth 2 (two) times daily.     Marland Kitchen HYDROcodone-acetaminophen (NORCO) 5-325 MG per tablet Take 1-2 tablets by mouth every 6 (six) hours as needed. 90 tablet 0  . loratadine (CLARITIN) 10 MG tablet Take 10 mg by mouth daily.    . magnesium hydroxide (MILK OF MAGNESIA) 400 MG/5ML suspension Take 30 mLs by mouth daily as needed for mild constipation.    . metoprolol (TOPROL-XL) 200 MG 24 hr tablet Take 200 mg by mouth daily.    . polyethylene glycol (MIRALAX / GLYCOLAX) packet  Take 17 g by mouth daily as needed for mild constipation. 14 each 0  . potassium chloride SA (K-DUR,KLOR-CON) 20 MEQ tablet Take 20 mEq by mouth daily.     . pravastatin (PRAVACHOL) 40 MG tablet Take 40 mg by mouth daily.    . promethazine (PHENERGAN) 25 MG tablet Take 1 tablet (25 mg total) by mouth every 6 (six) hours as needed for nausea. 30 tablet 1  . senna (SENOKOT) 8.6 MG TABS tablet Take 1 tablet (8.6 mg total) by mouth 2 (two) times daily. 120 each 0  . enoxaparin (LOVENOX) 40 MG/0.4ML injection Inject 0.4 mLs (40 mg total) into the skin daily. 14 Syringe 0  . metoprolol succinate (TOPROL-XL) 100 MG 24 hr tablet Take 1 tablet (100 mg total)  by mouth daily. Take with or immediately following a meal. (Patient not taking: Reported on 12/22/2014) 30 tablet 2  . senna-docusate (SENOKOT S) 8.6-50 MG per tablet Take 1 tablet by mouth at bedtime as needed. (Patient not taking: Reported on 12/22/2014) 30 tablet 1  . sorbitol 70 % SOLN Take 30 mLs by mouth daily as needed for moderate constipation. (Patient not taking: Reported on 12/22/2014) 500 mL 2    Allergies: Allergies as of 12/22/2014  . (No Known Allergies)   Past Medical History  Diagnosis Date  . Hypertension   . High cholesterol   . Stroke    Past Surgical History  Procedure Laterality Date  . Hernia repair    . Hip arthroplasty Right 07/10/2014    Procedure: ARTHROPLASTY BIPOLAR HIP;  Surgeon: Cheral AlmasNaiping Michael Xu, MD;  [facility];  Service: Orthopedics;  Laterality: Right;   Family History  Problem Relation Age of Onset  . CAD Neg Hx   . Dementia Other    History   Social History  . Marital Status: Widowed    Spouse Name: N/A    Number of Children: N/A  . Years of Education: N/A   Occupational History  . Not on file.   Social History Main Topics  . Smoking status: Never Smoker   . Smokeless tobacco: Not on file  . Alcohol Use: No  . Drug Use: No  . Sexual Activity: Not on file   Other Topics Concern  . Not on file   Social History Narrative    Review of Systems: Review of Systems  Constitutional: Negative for fever.  Cardiovascular: Positive for leg swelling. Negative for chest pain and palpitations.  Gastrointestinal: Negative for nausea, vomiting and diarrhea.  Neurological: Positive for speech change. Negative for focal weakness, seizures and weakness.     Physical Exam: Blood pressure 176/86, pulse 82, resp. rate 23, SpO2 100 %.  General: resting in bed, NAD HEENT: PERRL, EOMI, no scleral icterus, oropharynx clear Cardiac: systolic ejection murmur at RUSB Pulm: clear to auscultation bilaterally, no wheezes, rales, or rhonchi Abd:  soft, nontender, nondistended, BS present Ext: warm and well perfused, 1+ pretibial edema bilaterally Neuro: CN II-XII intact, 4/5 upper and lower extremity strength, 2+ grip strength, finger to nose movements intact, Babinski downgoing bilaterally   Lab results: Basic Metabolic Panel:  Recent Labs  16/08/9600/31/16 0824 12/22/14 0835  NA 133* 136  K 4.0 4.0  CL 94* 96  CO2 29  --   GLUCOSE 126* 123*  BUN 7 8  CREATININE 0.67 0.70  CALCIUM 9.3  --    Liver Function Tests:  Recent Labs  12/22/14 0824  AST 22  ALT 9  ALKPHOS 63  BILITOT  1.3*  PROT 7.5  ALBUMIN 3.4*   CBC:  Recent Labs  12/22/14 0824 12/22/14 0835  WBC 11.3*  --   NEUTROABS 6.8  --   HGB 13.3 16.3*  HCT 40.9 48.0*  MCV 80.2  --   PLT 374  --    Coagulation:  Recent Labs  12/22/14 0824  LABPROT 15.5*  INR 1.21   Urine Drug Screen: Drugs of Abuse     Component Value Date/Time   LABOPIA POSITIVE* 12/22/2014 0903   COCAINSCRNUR NONE DETECTED 12/22/2014 0903   LABBENZ NONE DETECTED 12/22/2014 0903   AMPHETMU NONE DETECTED 12/22/2014 0903   THCU NONE DETECTED 12/22/2014 0903   LABBARB NONE DETECTED 12/22/2014 0903    Alcohol Level:  Recent Labs  12/22/14 0824  ETH <5   Urinalysis:  Recent Labs  12/22/14 0903  COLORURINE YELLOW  LABSPEC 1.014  PHURINE 7.5  GLUCOSEU NEGATIVE  HGBUR NEGATIVE  BILIRUBINUR NEGATIVE  KETONESUR NEGATIVE  PROTEINUR 30*  UROBILINOGEN 0.2  NITRITE NEGATIVE  LEUKOCYTESUR NEGATIVE     Imaging results:  Ct Head Wo Contrast  12/22/2014   CLINICAL DATA:  Severe acute headache.  Intermittent aphasia.  EXAM: CT HEAD WITHOUT CONTRAST  TECHNIQUE: Contiguous axial images were obtained from the base of the skull through the vertex without intravenous contrast.  COMPARISON:  None.  FINDINGS: Bony calvarium appears intact. Mild diffuse cortical atrophy is noted. Mild chronic ischemic white matter disease is noted. No mass effect or midline shift is noted.  Ventricular size is within normal limits. There is no evidence of mass lesion, hemorrhage or acute infarction.  IMPRESSION: Mild diffuse cortical atrophy. Mild chronic ischemic white matter disease. No acute intracranial abnormality seen. Critical Value/emergent results were called by telephone at the time of interpretation on 12/22/2014 at 8:46 am to Dr. Amada Jupiter, who verbally acknowledged these results.   Electronically Signed   By: Roque Lias M.D.   On: 12/22/2014 08:49   Mr Brain Wo Contrast  12/22/2014   CLINICAL DATA:  Speech disturbance beginning 430 this morning. Headache. Blurred vision.  EXAM: MRI HEAD WITHOUT CONTRAST  TECHNIQUE: Multiplanar, multiecho pulse sequences of the brain and surrounding structures were obtained without intravenous contrast.  COMPARISON:  Head CT same day  FINDINGS: Diffusion imaging does not show any acute or subacute infarction. The brainstem and cerebellum are unremarkable. The cerebral hemispheres show advanced chronic small vessel change throughout the deep and subcortical hemispheric white matter. No cortical or large vessel territory infarction. No mass lesion, hemorrhage, hydrocephalus or extra-axial collection. No pituitary mass. No inflammatory sinus disease. No skull or skullbase lesion. Major vessels at the base of the brain show flow.  IMPRESSION: No acute finding. Advanced chronic small vessel ischemic changes throughout the cerebral hemispheric white matter.   Electronically Signed   By: Paulina Fusi M.D.   On: 12/22/2014 11:37    Other results: EKG: Reviewed and compared with 06/30/14 Normal axis ST-depression in V3 though stable  Assessment & Plan by Problem: Aphasia: Differential includes TIA, seizure, hypoglycemia. TIA suspect given prior history of CVA and risk factors [HTN, HLD] along with the limited time course of her symptoms though imaging findings do not show an acute process.  Seizure less likely in the absence of tongue biting,  bowel/bladder incontinence though did appear tired on exam. Hypoglycemia unlikely given that she does not take insulin at home, and glucose in the ED was 126. -Check echo, carotid dopplers, A1c, lipid panel, TSH -Increase to ASA  -  Continue neuro checks q2h then q4h -Follow-up brain MRA -Consult PT/OT -Monitor on telemetry -Neurology following, appreciate recs  Hypertensive urgency: Likely 2/2 missing BP meds this AM, most notably hydralazine, which is likely to cause rebound hypertension. Other home meds include Lasix  & metoprolol . Systolic BP 160-200 which >50% decrease as initially 250 per EMS.  -Allow for permissive HTN for next 48 hours [<220/120]  HLD: No lipid panel on file. -Continue pravastatin   #FEN:  -Diet: Regular [cleared by bedside SLP eval] -NS @ 50cc/hr x 12 hours -Continue home Ca & Vitamin D supplement  #DVT prophylaxis: Lovenox  #CODE STATUS: FULL CODE  Dispo: Disposition is deferred at this time, awaiting improvement of current medical problems.   The patient does have a current PCP (Ailene Ravel, MD) and does not need an [facility] hospital follow-up appointment after discharge.  The patient does have transportation limitations that hinder transportation to clinic appointments.  Signed: Heywood Iles, MD 12/22/2014, 2:42 PM

## 2014-12-22 NOTE — ED Notes (Signed)
EMS - Patient coming from home for Code Stroke.  Patient woke up at 04:30 this morning and then daughter noticed patients words were not making sense and called EMS.  Patient called EMS at [phone].  Patient reports headache since last night.  Patient given 10 of Labetalol.  BP 250/130, 91 CBG.  Some blurred vision reported.

## 2014-12-22 NOTE — ED Provider Notes (Signed)
CSN: [id number]     Arrival date & time 12/22/14  [id number] History   First MD Initiated Contact with Patient 12/22/14 0825     Chief Complaint  Patient presents with  . Code Stroke    An emergency department physician performed an initial assessment on this suspected stroke patient at [id number].  HPI  Patient presents as a code stroke. Last seen normal time was 4 hours prior to our evaluation. Conduction of the exam was performed with our neurology colleagues. The patient is aphasic, but it seems as though patient was last seen normal, speaking clearly, then went back to sleep, and upon awakening was found to have aphasia. Per reports patient has complained of headache in the last few days. Per EMS the patient was aphasic en route, hypertensive en route.  I discussed the patient while she was in transport to provide assistance with blood pressure management, including boluses of labetalol. Initial blood pressure was 250 systolic. Level V caveat secondary to neurologic changes  Past Medical History  Diagnosis Date  . Hypertension   . High cholesterol   . Stroke    Past Surgical History  Procedure Laterality Date  . Hernia repair    . Hip arthroplasty Right 07/10/2014    Procedure: ARTHROPLASTY BIPOLAR HIP;  Surgeon: Cheral AlmasNaiping Michael Xu, MD;  [facility];  Service: Orthopedics;  Laterality: Right;   Family History  Problem Relation Age of Onset  . CAD Neg Hx   . Dementia Other    History  Substance Use Topics  . Smoking status: Never Smoker   . Smokeless tobacco: Not on file  . Alcohol Use: No   OB History    No data available     Review of Systems  Unable to perform ROS: Mental status change      Allergies  Review of patient's allergies indicates no known allergies.  Home Medications   Prior to Admission medications   Medication Sig Start Date End Date Taking? Authorizing Provider  aspirin 81 MG chewable tablet Chew 81 mg by mouth daily.    Historical Provider, MD   calcium-vitamin D (OSCAL WITH D) 500-200 MG-UNIT per tablet Take 1 tablet by mouth daily with breakfast. 07/10/14   Naiping Glee ArvinMichael Xu, MD  enoxaparin (LOVENOX) 40 MG/0.4ML injection Inject 0.4 mLs (40 mg total) into the skin daily. 07/12/14 07/26/14  Richarda OverlieNayana Abrol, MD  feeding supplement, ENSURE COMPLETE, (ENSURE COMPLETE) LIQD Take 237 mLs by mouth 2 (two) times daily between meals. 07/12/14   Richarda OverlieNayana Abrol, MD  furosemide (LASIX) 20 MG tablet Take 20 mg by mouth daily.    Historical Provider, MD  hydrALAZINE (APRESOLINE) 50 MG tablet Take 50 mg by mouth daily.    Historical Provider, MD  HYDROcodone-acetaminophen (NORCO) 5-325 MG per tablet Take 1-2 tablets by mouth every 6 (six) hours as needed. 07/10/14   Naiping Glee ArvinMichael Xu, MD  loratadine (CLARITIN) 10 MG tablet Take 10 mg by mouth daily.    Historical Provider, MD  metoprolol succinate (TOPROL-XL) 100 MG 24 hr tablet Take 1 tablet (100 mg total) by mouth daily. Take with or immediately following a meal. 07/12/14   Richarda OverlieNayana Abrol, MD  polyethylene glycol (MIRALAX / GLYCOLAX) packet Take 17 g by mouth daily as needed for mild constipation. 07/12/14   Richarda OverlieNayana Abrol, MD  potassium chloride SA (K-DUR,KLOR-CON) 20 MEQ tablet Take 20 mEq by mouth daily.     Historical Provider, MD  pravastatin (PRAVACHOL) 40 MG tablet Take 40 mg by mouth  daily.    Historical Provider, MD  promethazine (PHENERGAN) 25 MG tablet Take 1 tablet (25 mg total) by mouth every 6 (six) hours as needed for nausea. 07/10/14   Naiping Glee Arvin, MD  senna (SENOKOT) 8.6 MG TABS tablet Take 1 tablet (8.6 mg total) by mouth 2 (two) times daily. 07/12/14   Richarda Overlie, MD  senna-docusate (SENOKOT S) 8.6-50 MG per tablet Take 1 tablet by mouth at bedtime as needed. 07/10/14   Naiping Glee Arvin, MD  sorbitol 70 % SOLN Take 30 mLs by mouth daily as needed for moderate constipation. 07/12/14   Richarda Overlie, MD   BP 198/83 mmHg  Pulse 75  Resp 22  SpO2 100% Physical Exam  Constitutional: She has  a sickly appearance.  Frail elderly female arrives via EMS.  HENT:  Head: Normocephalic and atraumatic.  Eyes: Conjunctivae and EOM are normal. Right eye exhibits no discharge. Left eye exhibits no discharge.  Patient inconsistently follows visual commands  Cardiovascular: Normal rate and regular rhythm.   Murmur heard. Pulmonary/Chest: Effort normal and breath sounds normal. No stridor. No respiratory distress. She has no wheezes. She has no rales.  Abdominal: She exhibits no distension. There is no tenderness.  Musculoskeletal: She exhibits no edema.  No obvious deformities  Neurological: She is alert. She displays atrophy and tremor. No cranial nerve deficit. She exhibits abnormal muscle tone. She displays no seizure activity. Coordination normal.  Patient oriented to self.  No facial asymmetry.  Speech is garbled, soft, answers inappropriately. Strength is 4/5 in upper and lower extremities, no gross dis-coordination. NIH 4  Skin: Skin is warm and dry.  Psychiatric: She has a normal mood and affect.  Nursing note and vitals reviewed.   ED Course  Procedures (including critical care time) Labs Review Labs Reviewed  PROTIME-INR - Abnormal; Notable for the following:    Prothrombin Time 15.5 (*)    All other components within normal limits  CBC - Abnormal; Notable for the following:    WBC 11.3 (*)    RDW 15.8 (*)    All other components within normal limits  I-STAT CHEM 8, ED - Abnormal; Notable for the following:    Glucose, Bld 123 (*)    Calcium, Ion 1.12 (*)    Hemoglobin 16.3 (*)    HCT 48.0 (*)    All other components within normal limits  APTT  DIFFERENTIAL  ETHANOL  COMPREHENSIVE METABOLIC PANEL  URINE RAPID DRUG SCREEN (HOSP PERFORMED)  URINALYSIS, ROUTINE W REFLEX MICROSCOPIC  I-STAT TROPOININ, ED  I-STAT TROPOININ, ED    Imaging Review Ct Head Wo Contrast  12/22/2014   CLINICAL DATA:  Severe acute headache.  Intermittent aphasia.  EXAM: CT HEAD WITHOUT  CONTRAST  TECHNIQUE: Contiguous axial images were obtained from the base of the skull through the vertex without intravenous contrast.  COMPARISON:  None.  FINDINGS: Bony calvarium appears intact. Mild diffuse cortical atrophy is noted. Mild chronic ischemic white matter disease is noted. No mass effect or midline shift is noted. Ventricular size is within normal limits. There is no evidence of mass lesion, hemorrhage or acute infarction.  IMPRESSION: Mild diffuse cortical atrophy. Mild chronic ischemic white matter disease. No acute intracranial abnormality seen. Critical Value/emergent results were called by telephone at the time of interpretation on 12/22/2014 at 8:46 am to Dr. Amada Jupiter, who verbally acknowledged these results.   Electronically Signed   By: Roque Lias M.D.   On: 12/22/2014 08:49   EMS EKG shows  sinus rhythm, rate 84, ST depressions laterally, substantial artifact, abnormal   First ECG: Rate 84, sinus, artifact, ST depressions in lateral leads, elevation, borderline in V1. Relative to prior, the ST depressions are more pronounced. Abnormal     (repeat) EKG Interpretation #2   Date/Time:  Sunday December 22 2014 08:46:18 EST Ventricular Rate:  84 PR Interval:  172 QRS Duration: 134 QT Interval:  447 QTC Calculation: 528 R Axis:   20 Text Interpretation:  Sinus rhythm Left atrial enlargement Right bundle  branch block Left ventricular hypertrophy Repol abnrm suggests ischemia,  diffuse leads Prolonged QT interval Sinus rhythm ST-t wave abnormality T  wave abnormality Artifact Abnormal ekg Confirmed by Gerhard Munch  MD  ([id number]) on 12/22/2014 9:33:48 AM      On arrival the patient's blood pressure was less than 200 systolic, but soon ascended to greater than 200 again. Patient received additional labetalol, similar dose to that provided in transport.    After discussion with neurology, given the passage of time the patient is not a candidate for TPA given her age,  though she has persistent aphasia.  Update: BP again greater than 200/100  1:18 PM Following several boluses of labetalol, blood pressure is improved, 156/83. Speech is clear, quiet, but appropriate. I discussed this case With our neurology colleagues. We agreed that further evaluation, management patient's likely TIA,  MDM   Patient presents with aphasia as a code stroke. Notably, the patient was particularly hypertensive, systolic 250 in route. With early initiation of antihypertensives, including EMS direction, patient's blood pressure improved substantially, and over hours her neurologic symptoms improved as well. However, given the patient's aphasia, hypertension, she required admission for further evaluation and management.  CRITICAL CARE Performed by: Gerhard Munch Total critical care time: 35 Critical care time was exclusive of separately billable procedures and treating other patients. Critical care was necessary to treat or prevent imminent or life-threatening deterioration. Critical care was time spent personally by me on the following activities: development of treatment plan with patient and/or surrogate as well as nursing, discussions with consultants, evaluation of patient's response to treatment, examination of patient, obtaining history from patient or surrogate, ordering and performing treatments and interventions, ordering and review of laboratory studies, ordering and review of radiographic studies, pulse oximetry and re-evaluation of patient's condition.    Gerhard Munch, MD 12/22/14 1322

## 2014-12-22 NOTE — Consult Note (Signed)
Neurology Consultation Reason for Consult: stroke Referring Physician: Jeraldine LootsLockwood, R  CC: aphasia  History is obtained from:EMS  HPI: Barkley Brunslice E Katayama is a 81 y.o. female with a history of hypertension who was last seen normal at 4:30 am when she was up with her granddaughter. She went back to sleep and on awakening thereafter, was noted to have severe speech difficulty.   Initially she would not follow commands, or speak, but was awake and interactive. She has improved but still has difficultly.    LKW: 4:30 am  tpa given?: no, outside of 3 hour window    ROS:  Unable to obtain due to altered mental status.   Past Medical History  Diagnosis Date  . Hypertension     Family History: Unable to assess secondary to patient's altered mental status.    Social History: Tob: Unable to assess secondary to patient's altered mental status.    Exam: Current vital signs: SpO2 100% Vital signs in last 24 hours: SpO2:  [100 %] 100 % (01/31 0843)  Physical Exam  Constitutional: Appears well-developed and well-nourished.  Psych: Affect appropriate to situation Eyes: No scleral injection HENT: No OP obstrucion Head: Normocephalic.  Cardiovascular: Normal rate and regular rhythm.  Respiratory: Effort normal and breath sounds normal to anterior ascultation GI: Soft.  No distension. There is no tenderness.  Skin: WDI  Neuro: Mental Status: Patient is awake, alert, unable to answer orientation questions other than name.  She has a moderate receptive aphasia.  Cranial Nerves: II: Visual Fields are full. Pupils are equal, round, and reactive to light.   III,IV, VI: EOMI without ptosis or diploplia.  V: Facial sensation is symmetric to temperature VII: Facial movement is symmetric.  VIII: hearing is intact to voice X: Uvula elevates symmetrically XI: Shoulder shrug is symmetric. XII: tongue is midline without atrophy or fasciculations.  Motor: Tone is normal. Bulk is normal. 5/5  strength was present in all four extremities.  Sensory: Sensation is symmetric to light touch and temperature in the arms and legs. Cerebellar: FNF and HKS are intact bilaterally    I have reviewed labs in epic and the results pertinent to this consultation are: Chem 8 - unremarkable.   I have reviewed the images obtained:CT head - negative for acute changes.   Impression: 81 yo F with new onset aphasia. I suspect temporal lobe ischemia. She will need a strkoe workup, but with her improving symptoms and time frame and age, would not favor tPA at this time.   Recommendations: 1. HgbA1c, fasting lipid panel 2. MRI, MRA  of the brain without contrast 3. Frequent neuro checks 4. Echocardiogram 5. Carotid dopplers 6. Prophylactic therapy-Antiplatelet med: Aspirin - dose 325mg  PO or 300mg  PR 7. Risk factor modification 8. Telemetry monitoring 9. PT consult, OT consult, Speech consult  Ritta SlotMcNeill Kirkpatrick, MD [facility] [phone]  If 7pm- 7am, please page neurology on call as listed in AMION.

## 2014-12-22 NOTE — Progress Notes (Signed)
Patient recd from ED on stretcher, accompanied by family. Patient and family oriented to safety plan. Bed in low position, call bell in reach, bed alarm on.

## 2014-12-23 ENCOUNTER — Observation Stay (HOSPITAL_COMMUNITY): Payer: PPO

## 2014-12-23 DIAGNOSIS — D649 Anemia, unspecified: Secondary | ICD-10-CM | POA: Diagnosis present

## 2014-12-23 DIAGNOSIS — Z7982 Long term (current) use of aspirin: Secondary | ICD-10-CM

## 2014-12-23 DIAGNOSIS — Z8709 Personal history of other diseases of the respiratory system: Secondary | ICD-10-CM

## 2014-12-23 DIAGNOSIS — G451 Carotid artery syndrome (hemispheric): Secondary | ICD-10-CM

## 2014-12-23 DIAGNOSIS — D72829 Elevated white blood cell count, unspecified: Secondary | ICD-10-CM | POA: Diagnosis present

## 2014-12-23 DIAGNOSIS — G459 Transient cerebral ischemic attack, unspecified: Principal | ICD-10-CM

## 2014-12-23 LAB — CBC WITH DIFFERENTIAL/PLATELET
BASOS PCT: 0 % (ref 0–1)
Basophils Absolute: 0 10*3/uL (ref 0.0–0.1)
EOS ABS: 0 10*3/uL (ref 0.0–0.7)
Eosinophils Relative: 0 % (ref 0–5)
HCT: 33.6 % — ABNORMAL LOW (ref 36.0–46.0)
Hemoglobin: 10.9 g/dL — ABNORMAL LOW (ref 12.0–15.0)
Lymphocytes Relative: 17 % (ref 12–46)
Lymphs Abs: 2 10*3/uL (ref 0.7–4.0)
MCH: 25.5 pg — ABNORMAL LOW (ref 26.0–34.0)
MCHC: 32.4 g/dL (ref 30.0–36.0)
MCV: 78.7 fL (ref 78.0–100.0)
MONO ABS: 0.9 10*3/uL (ref 0.1–1.0)
Monocytes Relative: 8 % (ref 3–12)
Neutro Abs: 8.7 10*3/uL — ABNORMAL HIGH (ref 1.7–7.7)
Neutrophils Relative %: 75 % (ref 43–77)
PLATELETS: 342 10*3/uL (ref 150–400)
RBC: 4.27 MIL/uL (ref 3.87–5.11)
RDW: 15.6 % — AB (ref 11.5–15.5)
WBC: 11.7 10*3/uL — ABNORMAL HIGH (ref 4.0–10.5)

## 2014-12-23 LAB — BASIC METABOLIC PANEL
Anion gap: 5 (ref 5–15)
BUN: 10 mg/dL (ref 6–23)
CO2: 30 mmol/L (ref 19–32)
Calcium: 8.1 mg/dL — ABNORMAL LOW (ref 8.4–10.5)
Chloride: 100 mmol/L (ref 96–112)
Creatinine, Ser: 0.75 mg/dL (ref 0.50–1.10)
GFR calc Af Amer: 83 mL/min — ABNORMAL LOW (ref >90)
GFR calc non Af Amer: 71 mL/min — ABNORMAL LOW (ref >90)
GLUCOSE: 91 mg/dL (ref 70–99)
POTASSIUM: 3.6 mmol/L (ref 3.5–5.1)
Sodium: 135 mmol/L (ref 135–145)

## 2014-12-23 LAB — LIPID PANEL
Cholesterol: 125 mg/dL (ref 0–200)
HDL: 52 mg/dL (ref >39)
LDL Cholesterol: 56 mg/dL (ref 0–99)
Total CHOL/HDL Ratio: 2.4 RATIO
Triglycerides: 84 mg/dL (ref <150)
VLDL: 17 mg/dL (ref 0–40)

## 2014-12-23 LAB — CBC
HEMATOCRIT: 33.8 % — AB (ref 36.0–46.0)
Hemoglobin: 10.9 g/dL — ABNORMAL LOW (ref 12.0–15.0)
MCH: 25.7 pg — AB (ref 26.0–34.0)
MCHC: 32.2 g/dL (ref 30.0–36.0)
MCV: 79.7 fL (ref 78.0–100.0)
Platelets: 338 10*3/uL (ref 150–400)
RBC: 4.24 MIL/uL (ref 3.87–5.11)
RDW: 16.1 % — ABNORMAL HIGH (ref 11.5–15.5)
WBC: 9 10*3/uL (ref 4.0–10.5)

## 2014-12-23 LAB — TSH: TSH: 2.138 u[IU]/mL (ref 0.350–4.500)

## 2014-12-23 MED ORDER — SENNOSIDES-DOCUSATE SODIUM 8.6-50 MG PO TABS: 1.0000 | ORAL_TABLET | Freq: Every evening | ORAL | Status: DC | PRN

## 2014-12-23 MED ORDER — ASPIRIN 325 MG PO TABS: 325.0000 mg | ORAL_TABLET | Freq: Every day | ORAL | Status: DC

## 2014-12-23 MED ORDER — PANTOPRAZOLE SODIUM 20 MG PO TBEC
20.0000 mg | DELAYED_RELEASE_TABLET | Freq: Every day | ORAL | Status: DC
  Administered 2014-12-23: 20 mg via ORAL
  Filled 2014-12-23: qty 1

## 2014-12-23 MED ORDER — PANTOPRAZOLE SODIUM 20 MG PO TBEC: 20.0000 mg | DELAYED_RELEASE_TABLET | Freq: Every day | ORAL | Status: DC

## 2014-12-23 NOTE — Discharge Summary (Signed)
Name: [name] Fry MRN: [id number] DOB: 1920/06/09 81 y.o. PCP: Ailene Ravel, MD  Date of Admission: 12/22/2014  8:23 AM Date of Discharge: 12/23/2014 Attending Physician: Doneen Poisson.  Discharge Diagnosis:  Principal Problem:   TIA (transient ischemic attack) Active Problems:   HTN (hypertension)   HLD (hyperlipidemia)   History of CVA (cerebrovascular accident) without residual deficits   Vitamin D deficiency   Allergic rhinitis   Osteoporosis   Chronic anemia   Leukocytosis   History of pleural effusion  Discharge Medications:   Medication List    STOP taking these medications        aspirin 81 MG chewable tablet  Replaced by:  aspirin 325 MG tablet     enoxaparin 40 MG/0.4ML injection  Commonly known as:  LOVENOX     senna 8.6 MG Tabs tablet  Commonly known as:  SENOKOT      TAKE these medications        acetaminophen 325 MG tablet  Commonly known as:  TYLENOL  Take 650 mg by mouth every 6 (six) hours as needed for mild pain or moderate pain.     aspirin 325 MG tablet  Take 1 tablet (325 mg total) by mouth daily.     calcium-vitamin D 500-200 MG-UNIT per tablet  Commonly known as:  OSCAL WITH D  Take 1 tablet by mouth daily with breakfast.     feeding supplement (ENSURE COMPLETE) Liqd  Take 237 mLs by mouth 2 (two) times daily between meals.     furosemide 20 MG tablet  Commonly known as:  LASIX  Take 20 mg by mouth daily.     hydrALAZINE 50 MG tablet  Commonly known as:  APRESOLINE  Take 75 mg by mouth 2 (two) times daily.     HYDROcodone-acetaminophen 5-325 MG per tablet  Commonly known as:  NORCO  Take 1-2 tablets by mouth every 6 (six) hours as needed.     loratadine 10 MG tablet  Commonly known as:  CLARITIN  Take 10 mg by mouth daily.     magnesium hydroxide 400 MG/5ML suspension  Commonly known as:  MILK OF MAGNESIA  Take 30 mLs by mouth daily as needed for mild constipation.     metoprolol 200 MG 24 hr tablet  Commonly  known as:  TOPROL-XL  Take 200 mg by mouth daily.     pantoprazole 20 MG tablet  Commonly known as:  PROTONIX  Take 1 tablet (20 mg total) by mouth daily.     polyethylene glycol packet  Commonly known as:  MIRALAX / GLYCOLAX  Take 17 g by mouth daily as needed for mild constipation.     potassium chloride SA 20 MEQ tablet  Commonly known as:  K-DUR,KLOR-CON  Take 20 mEq by mouth daily.     pravastatin 40 MG tablet  Commonly known as:  PRAVACHOL  Take 40 mg by mouth daily.     promethazine 25 MG tablet  Commonly known as:  PHENERGAN  Take 1 tablet (25 mg total) by mouth every 6 (six) hours as needed for nausea.     senna-docusate 8.6-50 MG per tablet  Commonly known as:  SENOKOT S  Take 1 tablet by mouth at bedtime as needed.     sorbitol 70 % Soln  Take 30 mLs by mouth daily as needed for moderate constipation.        Disposition and follow-up:   [name] Fry was discharged from [facility]  Hospital in Stable condition.  At the hospital follow up visit please address:  1.  Continued resolution of symptoms.  Compliance with aspirin 325 mg and Protonix 20 mg for gastritis prophylaxis.  Blood pressure control.  2.  Labs / imaging needed at time of follow-up: None.  3.  Pending labs/ test needing follow-up: Hemoglobin A1c, vitamin D25.  Follow-up Appointments:     Follow-up Information    Follow up with CONROY,NATHAN, PA-C. Go on 01/02/2015.   Specialty:  Physician Assistant   Why:  11:00AM for f/u hospital discharge: TIA/aphasia   Contact information:   [facility] [address] [place] [address] [phone]       Discharge Instructions:  Thank you for allowing [place] to be involved in your healthcare while you were hospitalized at [facility].   Please note that there have been changes to your home medications.  --> PLEASE LOOK AT YOUR DISCHARGE MEDICATION LIST FOR DETAILS.   Please call your PCP if you have any  questions or concerns, or any difficulty getting any of your medications.  Please return to the ER if you have worsening of your symptoms or new severe symptoms arise.  Please resume your home blood pressure medications tomorrow.  We increased your dose of aspirin to 325 mg daily to prevent future strokes.  We would like you to take Protonix to prevent gastritis on this higher dose of aspirin.  Discharge Instructions    Call MD for:  difficulty breathing, headache or visual disturbances    Complete by:  As directed      Call MD for:  extreme fatigue    Complete by:  As directed      Call MD for:  persistant dizziness or light-headedness    Complete by:  As directed      Call MD for:  severe uncontrolled pain    Complete by:  As directed      Call MD for:  temperature >100.4    Complete by:  As directed      Call MD for:    Complete by:  As directed   New weakness, numbness, or trouble speaking.     Diet - low sodium heart healthy    Complete by:  As directed      Increase activity slowly    Complete by:  As directed            Consultations: Neurology.  Procedures Performed:  Dg Chest 2 View  12/23/2014   CLINICAL DATA:  81 year old female with history of pleural effusions. Hypertension.  EXAM: CHEST  2 VIEW  COMPARISON:  07/10/2014  FINDINGS: Moderate right pleural effusion appears similar to prior exam. There is a small left pleural effusion that has increased. Mild cardiomegaly and atherosclerosis of the thoracic aorta, stable from prior. Pulmonary vasculature is normal. No pneumothorax. Severe degenerative change of both shoulders again seen.  IMPRESSION: Unchanged moderate right pleural effusion, obscuring evaluation of the right lung base parenchymal. There is a small left pleural effusion that is increased from prior. Stable cardiomegaly.   Electronically Signed   By: Rubye Oaks M.D.   On: 12/23/2014 03:48   Ct Head Wo Contrast  12/22/2014   CLINICAL DATA:  Severe  acute headache.  Intermittent aphasia.  EXAM: CT HEAD WITHOUT CONTRAST  TECHNIQUE: Contiguous axial images were obtained from the base of the skull through the vertex without intravenous contrast.  COMPARISON:  None.  FINDINGS: Bony calvarium appears intact.  Mild diffuse cortical atrophy is noted. Mild chronic ischemic white matter disease is noted. No mass effect or midline shift is noted. Ventricular size is within normal limits. There is no evidence of mass lesion, hemorrhage or acute infarction.  IMPRESSION: Mild diffuse cortical atrophy. Mild chronic ischemic white matter disease. No acute intracranial abnormality seen. Critical Value/emergent results were called by telephone at the time of interpretation on 12/22/2014 at 8:46 am to Dr. Amada Jupiter, who verbally acknowledged these results.   Electronically Signed   By: Roque Lias M.D.   On: 12/22/2014 08:49   Mr Maxine Glenn Head Wo Contrast  12/23/2014   CLINICAL DATA:  Aphasia. History of hypertension, hyperlipidemia and stroke.  EXAM: MRA HEAD WITHOUT CONTRAST  TECHNIQUE: Angiographic images of the Circle of Willis were obtained using MRA technique without intravenous contrast.  COMPARISON:  MRI of the brain December 22, 2014 and CT of the head December 22, 2014  FINDINGS: Mildly motion degraded examination.  Anterior circulation: Normal flow related enhancement of the included cervical, petrous internal carotid arteries. The irregular though patent bilateral cavernous and to lesser extent supra clinoid internal carotid arteries corresponding to extensive calcifications on prior CT. There is focal luminal irregularity and apparent 4 mm laterally directed outpouching of the LEFT cavernous carotid artery. Patent anterior communicating artery. Preserved Flow related enhancement of the anterior and middle cerebral arteries, including more distal segments. At least midgrade stenosis of RIGHT M2 branch proximally.  No large vessel occlusion, high-grade stenosis. Diffuse  mild luminal irregularity of the intracranial vessels.  Posterior circulation: RIGHT vertebral artery is dominant. The LEFT vertebral artery appears to terminate in the posterior inferior cerebellar artery. Suspected high-grade stenosis of the proximal basilar artery. Basilar artery is patent. Main branch vessels grossly normal though motion degrades sensitivity. Robust LEFT posterior communicating artery. Normal flow related enhancement of the posterior cerebral arteries.  No large vessel occlusion, aneurysm. Mild luminal irregularity consistent with atherosclerosis.  IMPRESSION: Motion degraded examination without large vessel occlusion.  Irregular appearance of bilateral cavernous carotid arteries, which is likely related to calcific atherosclerosis though, superimposed outpouching along the lateral aspect of the LEFT cavernous carotid artery may reflect atherosclerosis, artifact or potentially small extradural aneurysm.  Suspected high-grade stenosis of the the proximal basilar artery. At least high-grade stenosis of proximal RIGHT M2 branch.  Luminal irregularity of the cerebral arteries most consistent with atherosclerosis.  Constellation of findings would be better demonstrated on CT angiogram of the head as clinically indicated.   Electronically Signed   By: Awilda Metro   On: 12/23/2014 03:50   Mr Brain Wo Contrast  12/22/2014   CLINICAL DATA:  Speech disturbance beginning 430 this morning. Headache. Blurred vision.  EXAM: MRI HEAD WITHOUT CONTRAST  TECHNIQUE: Multiplanar, multiecho pulse sequences of the brain and surrounding structures were obtained without intravenous contrast.  COMPARISON:  Head CT same day  FINDINGS: Diffusion imaging does not show any acute or subacute infarction. The brainstem and cerebellum are unremarkable. The cerebral hemispheres show advanced chronic small vessel change throughout the deep and subcortical hemispheric white matter. No cortical or large vessel territory  infarction. No mass lesion, hemorrhage, hydrocephalus or extra-axial collection. No pituitary mass. No inflammatory sinus disease. No skull or skullbase lesion. Major vessels at the base of the brain show flow.  IMPRESSION: No acute finding. Advanced chronic small vessel ischemic changes throughout the cerebral hemispheric white matter.   Electronically Signed   By: Paulina Fusi M.D.   On: 12/22/2014 11:37  2D Echo:  Study Conclusions  - Left ventricle: The cavity size was normal. Wall thickness was normal. Systolic function was normal. The estimated ejection fraction was in the range of 55% to 60%. Wall motion was normal; there were no regional wall motion abnormalities. Features are consistent with a pseudonormal left ventricular filling pattern, with concomitant abnormal relaxation and increased filling pressure (grade 2 diastolic dysfunction). Doppler parameters are consistent with elevated mean left atrial filling pressure. - Mitral valve: There was mild to moderate regurgitation directed centrally. - Left atrium: The atrium was moderately dilated. - Right atrium: The atrium was mildly dilated. - Atrial septum: No defect or patent foramen ovale was identified. - Tricuspid valve: There was mild-moderate regurgitation directed centrally. - Pulmonary arteries: Systolic pressure was moderately increased. PA peak pressure: 50 mm Hg (S).  Admission HPI:  [name] Fry is a 81 year old female with HTN, HLD, prior CVA [~2006] who presents with aphasia. As the patient was somewhat tired-appearing on exam, her 2 daughters and granddaughter contributed to the interview.   This morning around 430, the patient and her granddaughter were up talking and then both went back to sleep. Around 730, the patient was noted to be rambling and referring to a grandbaby in a disoriented manner. She attempted this twice in the form of a conversation before stopping altogether. EMS was then  called who reported she had systolic BP in the 250s for which she was given boluses of IV labetalol. The patient reports having a headache but family members deny any reported weakness, numbness, facial drooping, tongue biting, urinary/fecal incontinence, weakness, nausea, vomiting, diarrhea, change in PO intake though has been feeling queasy in her stomach with several family members at home having GI illness. She had a prior CVA in 2006 for which she received tPA and has been on ASA 81mg  since. At baseline, she is able to ambulate around the home with a walker, dress, bathe, and toilet independently though lives with a daughter.   In the ED, Neurology was consulted and recommended TIA workup. Head CT & brain MRI were unremarkable for acute infarct.   Hospital Course by problem list: Principal Problem:   TIA (transient ischemic attack) Active Problems:   HTN (hypertension)   HLD (hyperlipidemia)   History of CVA (cerebrovascular accident) without residual deficits   Vitamin D deficiency   Allergic rhinitis   Osteoporosis   Chronic anemia   Leukocytosis   History of pleural effusion   #Transient ischemic attack Patient presented with isolated aphasia with no associated weakness or numbness while taking aspirin 81 mg daily. This resolved shortly after arrival to ER, and she remains slightly drowsy. The next morning she was back to her baseline and speaking without difficulty. CT and MRI were unremarkable and MRA brain showed diffuse stenosis of multiple small arteries. This atherosclerosis was thought to be the origin of her TIA. Further stroke workup including echo, carotid Dopplers, cardiac monitoring, and TSH was unremarkable. Neurology recommended increasing her aspirin to 325 mg daily and continuing her pravastatin 40 mg daily. She was also started on Protonix 20 mg daily to prevent aspirin induced gastritis or GI bleeding. She was seen by PT and OT who recommended discharge home without  follow-up.  #Hypertension Blood pressure was elevated to 250 systolic on arrival to the ER, and she was given labetalol IV with successful lowering of her blood pressure. Permissive hypertension was allowed for the rest of the admission and her blood pressure ran around 180s systolic  off of her home meds. She was told to restart her home blood pressure post discharge.  Discharge Vitals:   BP 180/78 mmHg  Pulse 66  Temp(Src) 98.4 F (36.9 C) (Oral)  Resp 20  SpO2 96%  Discharge Labs:  Results for orders placed or performed during the hospital encounter of 12/22/14 (from the past 24 hour(s))  CBC with Differential/Platelet     Status: Abnormal   Collection Time: 12/22/14  6:46 PM  Result Value Ref Range   WBC 11.7 (H) 4.0 - 10.5 K/uL   RBC 4.27 3.87 - 5.11 MIL/uL   Hemoglobin 10.9 (L) 12.0 - 15.0 g/dL   HCT 16.1 (L) 09.6 - 04.5 %   MCV 78.7 78.0 - 100.0 fL   MCH 25.5 (L) 26.0 - 34.0 pg   MCHC 32.4 30.0 - 36.0 g/dL   RDW 40.9 (H) 81.1 - 91.4 %   Platelets 342 150 - 400 K/uL   Neutrophils Relative % 75 43 - 77 %   Neutro Abs 8.7 (H) 1.7 - 7.7 K/uL   Lymphocytes Relative 17 12 - 46 %   Lymphs Abs 2.0 0.7 - 4.0 K/uL   Monocytes Relative 8 3 - 12 %   Monocytes Absolute 0.9 0.1 - 1.0 K/uL   Eosinophils Relative 0 0 - 5 %   Eosinophils Absolute 0.0 0.0 - 0.7 K/uL   Basophils Relative 0 0 - 1 %   Basophils Absolute 0.0 0.0 - 0.1 K/uL  TSH     Status: None   Collection Time: 12/23/14  7:26 AM  Result Value Ref Range   TSH 2.138 0.350 - 4.500 uIU/mL  Basic metabolic panel     Status: Abnormal   Collection Time: 12/23/14  7:26 AM  Result Value Ref Range   Sodium 135 135 - 145 mmol/L   Potassium 3.6 3.5 - 5.1 mmol/L   Chloride 100 96 - 112 mmol/L   CO2 30 19 - 32 mmol/L   Glucose, Bld 91 70 - 99 mg/dL   BUN 10 6 - 23 mg/dL   Creatinine, Ser 7.82 0.50 - 1.10 mg/dL   Calcium 8.1 (L) 8.4 - 10.5 mg/dL   GFR calc non Af Amer 71 (L) >90 mL/min   GFR calc Af Amer 83 (L) >90 mL/min    Anion gap 5 5 - 15  Lipid panel     Status: None   Collection Time: 12/23/14  7:26 AM  Result Value Ref Range   Cholesterol 125 0 - 200 mg/dL   Triglycerides 84 <956 mg/dL   HDL 52 >21 mg/dL   Total CHOL/HDL Ratio 2.4 RATIO   VLDL 17 0 - 40 mg/dL   LDL Cholesterol 56 0 - 99 mg/dL  CBC     Status: Abnormal   Collection Time: 12/23/14  7:26 AM  Result Value Ref Range   WBC 9.0 4.0 - 10.5 K/uL   RBC 4.24 3.87 - 5.11 MIL/uL   Hemoglobin 10.9 (L) 12.0 - 15.0 g/dL   HCT 30.8 (L) 65.7 - 84.6 %   MCV 79.7 78.0 - 100.0 fL   MCH 25.7 (L) 26.0 - 34.0 pg   MCHC 32.2 30.0 - 36.0 g/dL   RDW 96.2 (H) 95.2 - 84.1 %   Platelets 338 150 - 400 K/uL    Signed: Luisa Dago, MD 12/23/2014, 4:47 PM    Services Ordered on Discharge: None. Equipment Ordered on Discharge: None.

## 2014-12-23 NOTE — Progress Notes (Signed)
Echocardiogram 2D Echocardiogram has been performed.  [name] BasemanReel, [name] M 12/23/2014, 8:57 AM

## 2014-12-23 NOTE — Progress Notes (Signed)
Internal Medicine Attending Admission Note Date: 12/23/2014  Patient name: [name] Fry Medical record number: [id number] Date of birth: 1920/08/21 Age: 81 y.o. Gender: female  I saw and evaluated the patient. I reviewed the resident's note and I agree with the resident's findings and plan as documented in the resident's note.  Chief Complaint(s): Transient aphasia  History - key components related to admission:  [name] is a 81 year old woman with a history of hypertension who was last seen normal on the morning of admission at 4:30. She went back to sleep and woke up approximately 3 hours later. At that time she had trouble forming words, slurred speech, and a headache. EMS was called to transport her to the emergency department and she was found to have a systolic blood pressure of 250. Initial CT scan was without evidence of a head bleed and an MRI was unremarkable for acute ischemia. An MRA revealed small vessel disease in the posterior circulation as well as the intracerebral circulation. Carotid Dopplers demonstrated bilateral stenoses of 1-39% and an echocardiogram revealed grade 2 diastolic dysfunction without an intracardiac thrombus. ECG was normal sinus rhythm. Neurologically, her aphasia and subsequent dysarthria resolved and she is neurologically intact on examination this morning. These events occurred while taking aspirin 81 mg daily.  Physical Exam - key components related to admission:  Filed Vitals:   12/23/14 0200 12/23/14 0400 12/23/14 1022 12/23/14 1412  BP: 136/55 135/60 155/65 180/78  Pulse: 64 60 65 66  Temp: 98.2 F (36.8 C) 99 F (37.2 C) 98.4 F (36.9 C) 98.4 F (36.9 C)  TempSrc: Oral Oral Oral Oral  Resp: 15 16 20 20   SpO2: 98% 98% 97% 96%   Gen.: Well-developed, well-nourished, woman lying comfortably in bed in no acute distress. Her speech is clear and she follows commands. She is alert and oriented 3. Neurologically her strength is 5/5 in the upper  extremities and lower extremities in all muscle groups. Her sensation is intact to light touch. Cranial nerves II through XII are intact. Cardiac exam is remarkable for a mid systolic crescendo decrescendo murmur that radiates to the right carotid. The S2 is fixed and split and well heard suggesting mild aortic stenosis per physical exam.  Lab results:  Basic Metabolic Panel:  Recent Labs  40/98/1101/31/16 0824 12/22/14 0835 12/23/14 0726  NA 133* 136 135  K 4.0 4.0 3.6  CL 94* 96 100  CO2 29  --  30  GLUCOSE 126* 123* 91  BUN 7 8 10   CREATININE 0.67 0.70 0.75  CALCIUM 9.3  --  8.1*   Liver Function Tests:  Recent Labs  12/22/14 0824  AST 22  ALT 9  ALKPHOS 63  BILITOT 1.3*  PROT 7.5  ALBUMIN 3.4*   CBC:  Recent Labs  12/22/14 0824  12/22/14 1846 12/23/14 0726  WBC 11.3*  --  11.7* 9.0  NEUTROABS 6.8  --  8.7*  --   HGB 13.3  < > 10.9* 10.9*  HCT 40.9  < > 33.6* 33.8*  MCV 80.2  --  78.7 79.7  PLT 374  --  342 338  < > = values in this interval not displayed.  Fasting Lipid Panel:  Recent Labs  12/23/14 0726  CHOL 125  HDL 52  LDLCALC 56  TRIG 84  CHOLHDL 2.4   Thyroid Function Tests:  Recent Labs  12/23/14 0726  TSH 2.138   Coagulation:  Recent Labs  12/22/14 0824  INR 1.21   Urine  Drug Screen:  Positive for opiates  Alcohol Level:  Recent Labs  12/22/14 0824  ETH <5   Urinalysis:  Protein 30, otherwise unremarkable including microscopic examination  Misc. Labs:  Hemoglobin A1c pending  Imaging results:  Dg Chest 2 View  12/23/2014   CLINICAL DATA:  81 year old female with history of pleural effusions. Hypertension.  EXAM: CHEST  2 VIEW  COMPARISON:  07/10/2014  FINDINGS: Moderate right pleural effusion appears similar to prior exam. There is a small left pleural effusion that has increased. Mild cardiomegaly and atherosclerosis of the thoracic aorta, stable from prior. Pulmonary vasculature is normal. No pneumothorax. Severe  degenerative change of both shoulders again seen.  IMPRESSION: Unchanged moderate right pleural effusion, obscuring evaluation of the right lung base parenchymal. There is a small left pleural effusion that is increased from prior. Stable cardiomegaly.   Electronically Signed   By: Rubye Oaks M.D.   On: 12/23/2014 03:48   Ct Head Wo Contrast  12/22/2014   CLINICAL DATA:  Severe acute headache.  Intermittent aphasia.  EXAM: CT HEAD WITHOUT CONTRAST  TECHNIQUE: Contiguous axial images were obtained from the base of the skull through the vertex without intravenous contrast.  COMPARISON:  None.  FINDINGS: Bony calvarium appears intact. Mild diffuse cortical atrophy is noted. Mild chronic ischemic white matter disease is noted. No mass effect or midline shift is noted. Ventricular size is within normal limits. There is no evidence of mass lesion, hemorrhage or acute infarction.  IMPRESSION: Mild diffuse cortical atrophy. Mild chronic ischemic white matter disease. No acute intracranial abnormality seen. Critical Value/emergent results were called by telephone at the time of interpretation on 12/22/2014 at 8:46 am to Dr. Amada Jupiter, who verbally acknowledged these results.   Electronically Signed   By: Roque Lias M.D.   On: 12/22/2014 08:49   Mr Maxine Glenn Head Wo Contrast  12/23/2014   CLINICAL DATA:  Aphasia. History of hypertension, hyperlipidemia and stroke.  EXAM: MRA HEAD WITHOUT CONTRAST  TECHNIQUE: Angiographic images of the Circle of Willis were obtained using MRA technique without intravenous contrast.  COMPARISON:  MRI of the brain December 22, 2014 and CT of the head December 22, 2014  FINDINGS: Mildly motion degraded examination.  Anterior circulation: Normal flow related enhancement of the included cervical, petrous internal carotid arteries. The irregular though patent bilateral cavernous and to lesser extent supra clinoid internal carotid arteries corresponding to extensive calcifications on prior CT.  There is focal luminal irregularity and apparent 4 mm laterally directed outpouching of the LEFT cavernous carotid artery. Patent anterior communicating artery. Preserved Flow related enhancement of the anterior and middle cerebral arteries, including more distal segments. At least midgrade stenosis of RIGHT M2 branch proximally.  No large vessel occlusion, high-grade stenosis. Diffuse mild luminal irregularity of the intracranial vessels.  Posterior circulation: RIGHT vertebral artery is dominant. The LEFT vertebral artery appears to terminate in the posterior inferior cerebellar artery. Suspected high-grade stenosis of the proximal basilar artery. Basilar artery is patent. Main branch vessels grossly normal though motion degrades sensitivity. Robust LEFT posterior communicating artery. Normal flow related enhancement of the posterior cerebral arteries.  No large vessel occlusion, aneurysm. Mild luminal irregularity consistent with atherosclerosis.  IMPRESSION: Motion degraded examination without large vessel occlusion.  Irregular appearance of bilateral cavernous carotid arteries, which is likely related to calcific atherosclerosis though, superimposed outpouching along the lateral aspect of the LEFT cavernous carotid artery may reflect atherosclerosis, artifact or potentially small extradural aneurysm.  Suspected high-grade stenosis of the the  proximal basilar artery. At least high-grade stenosis of proximal RIGHT M2 branch.  Luminal irregularity of the cerebral arteries most consistent with atherosclerosis.  Constellation of findings would be better demonstrated on CT angiogram of the head as clinically indicated.   Electronically Signed   By: Awilda Metro   On: 12/23/2014 03:50   Mr Brain Wo Contrast  12/22/2014   CLINICAL DATA:  Speech disturbance beginning 430 this morning. Headache. Blurred vision.  EXAM: MRI HEAD WITHOUT CONTRAST  TECHNIQUE: Multiplanar, multiecho pulse sequences of the brain and  surrounding structures were obtained without intravenous contrast.  COMPARISON:  Head CT same day  FINDINGS: Diffusion imaging does not show any acute or subacute infarction. The brainstem and cerebellum are unremarkable. The cerebral hemispheres show advanced chronic small vessel change throughout the deep and subcortical hemispheric white matter. No cortical or large vessel territory infarction. No mass lesion, hemorrhage, hydrocephalus or extra-axial collection. No pituitary mass. No inflammatory sinus disease. No skull or skullbase lesion. Major vessels at the base of the brain show flow.  IMPRESSION: No acute finding. Advanced chronic small vessel ischemic changes throughout the cerebral hemispheric white matter.   Electronically Signed   By: Paulina Fusi M.D.   On: 12/22/2014 11:37   Other results:  EKG: Sinus rhythm, right bundle branch block, left atrial enlargement, left ventricular hypertrophy by voltage in the precordial leads, no significant changes from the previous ECG  Assessment & Plan by Problem:  Ms. Doering is a 81 year old woman with a history of hypertension who presents with signs and symptoms consistent with a temporal lobe transient ischemic attack. This is while taking aspirin 81 mg by mouth daily. MRA reveals small vessel disease but the rest of the stroke workup was unremarkable. This suggests that the source of the transient ischemic attack may have been from one of these small vessels. Neurologically she has returned to baseline.  Plan  1) Transient ischemic attack: We will increase the aspirin to 325 mg per mouth daily and add a proton pump inhibitor to protect her stomach from this increased aspirin dose. We are allowing for permissive hypertension but upon discharge we'll place her back on her regular outpatient antihypertensive medications.  2) Disposition: We will discharge Ms. Mey home today with follow-up in her primary care providers office.

## 2014-12-23 NOTE — Progress Notes (Signed)
Subjective:    Currently, the patient reports feeling much better.  She appears to have much more energy and is talking without difficulty.  She denies any numbness, weakness, speech difficultly, chest pain, or trouble breathing.  Ate breakfast without issue.  Interval Events: -MRA demonstrated some chronic vascular disease and stenosis, stroke work-up otherwise unremarkable.   Objective:    Vital Signs:   Temp:  [98.1 F (36.7 C)-99.6 F (37.6 C)] 98.4 F (36.9 C) (02/01 1412) Pulse Rate:  [60-83] 66 (02/01 1412) Resp:  [15-27] 20 (02/01 1412) BP: (123-180)/(51-84) 180/78 mmHg (02/01 1412) SpO2:  [96 %-98 %] 96 % (02/01 1412) Last BM Date:  (prior to admission; pt not sure)  24-hour weight change: Weight change:   Intake/Output:   Intake/Output Summary (Last 24 hours) at 12/23/14 1503 Last data filed at 12/22/14 2127  Gross per 24 hour  Intake      3 ml  Output      0 ml  Net      3 ml      Physical Exam: General: Well-developed, well-nourished, in no acute distress; alert upon waking from sleep, appropriate and cooperative throughout examination.  Speech back to baseline.  Lungs:  Normal respiratory effort. Clear to auscultation BL without crackles or wheezes.  Heart: 3/6 crescendo/decrescendo murmur at RUSB, fixed split S2.  Neuro: CN 2-12 intact, 5/5 strength throughout, sensation to light touch intact.  Extremities: No pretibial edema.     Labs:  Basic Metabolic Panel:  Recent Labs Lab 12/22/14 0824 12/22/14 0835 12/23/14 0726  NA 133* 136 135  K 4.0 4.0 3.6  CL 94* 96 100  CO2 29  --  30  GLUCOSE 126* 123* 91  BUN CREATININE 0.67 0.70 0.75  CALCIUM 9.3  --  8.1*    Liver Function Tests:  Recent Labs Lab 12/22/14 0824  AST 22  ALT 9  ALKPHOS 63  BILITOT 1.3*  PROT 7.5  ALBUMIN 3.4*   CBC:  Recent Labs Lab 12/22/14 0824 12/22/14 0835 12/22/14 1846 12/23/14 0726  WBC 11.3*  --  11.7* 9.0  NEUTROABS 6.8  --  8.7*  --     HGB 13.3 16.3* 10.9* 10.9*  HCT 40.9 48.0* 33.6* 33.8*  MCV 80.2  --  78.7 79.7  PLT 374  --  342 338   Microbiology: Results for orders placed or performed during the hospital encounter of 07/09/14  Culture, Urine     Status: None   Collection Time: 07/10/14 12:30 AM  Result Value Ref Range Status   Specimen Description URINE, CATHETERIZED  Final   Special Requests NONE  Final   Culture  Setup Time   Final    07/10/2014 01:23 Performed at [facility]   Colony Count NO GROWTH Performed at [facility]  Final   Culture NO GROWTH Performed at [facility]  Final   Report Status 07/11/2014 FINAL  Final  Surgical pcr screen     Status: None   Collection Time: 07/10/14  8:31 AM  Result Value Ref Range Status   MRSA, PCR NEGATIVE NEGATIVE Final   Staphylococcus aureus NEGATIVE NEGATIVE Final    Comment:        The Xpert SA Assay (FDA approved for NASAL specimens in patients over 31 years of age), is one component of a comprehensive surveillance program.  Test performance has been validated by [employer] for patients greater than or equal to 1 year  old. It is not intended to diagnose infection nor to guide or monitor treatment.    Coagulation Studies:  Recent Labs  12/22/14 0824  LABPROT 15.5*  INR 1.21    Imaging: Dg Chest 2 View  12/23/2014   CLINICAL DATA:  81 year old female with history of pleural effusions. Hypertension.  EXAM: CHEST  2 VIEW  COMPARISON:  07/10/2014  FINDINGS: Moderate right pleural effusion appears similar to prior exam. There is a small left pleural effusion that has increased. Mild cardiomegaly and atherosclerosis of the thoracic aorta, stable from prior. Pulmonary vasculature is normal. No pneumothorax. Severe degenerative change of both shoulders again seen.  IMPRESSION: Unchanged moderate right pleural effusion, obscuring evaluation of the right lung base parenchymal. There is a small left pleural effusion that is  increased from prior. Stable cardiomegaly.   Electronically Signed   By: Rubye Oaks M.D.   On: 12/23/2014 03:48   Ct Head Wo Contrast  12/22/2014   CLINICAL DATA:  Severe acute headache.  Intermittent aphasia.  EXAM: CT HEAD WITHOUT CONTRAST  TECHNIQUE: Contiguous axial images were obtained from the base of the skull through the vertex without intravenous contrast.  COMPARISON:  None.  FINDINGS: Bony calvarium appears intact. Mild diffuse cortical atrophy is noted. Mild chronic ischemic white matter disease is noted. No mass effect or midline shift is noted. Ventricular size is within normal limits. There is no evidence of mass lesion, hemorrhage or acute infarction.  IMPRESSION: Mild diffuse cortical atrophy. Mild chronic ischemic white matter disease. No acute intracranial abnormality seen. Critical Value/emergent results were called by telephone at the time of interpretation on 12/22/2014 at 8:46 am to Dr. Amada Jupiter, who verbally acknowledged these results.   Electronically Signed   By: Roque Lias M.D.   On: 12/22/2014 08:49   Mr Maxine Glenn Head Wo Contrast  12/23/2014   CLINICAL DATA:  Aphasia. History of hypertension, hyperlipidemia and stroke.  EXAM: MRA HEAD WITHOUT CONTRAST  TECHNIQUE: Angiographic images of the Circle of Willis were obtained using MRA technique without intravenous contrast.  COMPARISON:  MRI of the brain December 22, 2014 and CT of the head December 22, 2014  FINDINGS: Mildly motion degraded examination.  Anterior circulation: Normal flow related enhancement of the included cervical, petrous internal carotid arteries. The irregular though patent bilateral cavernous and to lesser extent supra clinoid internal carotid arteries corresponding to extensive calcifications on prior CT. There is focal luminal irregularity and apparent 4 mm laterally directed outpouching of the LEFT cavernous carotid artery. Patent anterior communicating artery. Preserved Flow related enhancement of the  anterior and middle cerebral arteries, including more distal segments. At least midgrade stenosis of RIGHT M2 branch proximally.  No large vessel occlusion, high-grade stenosis. Diffuse mild luminal irregularity of the intracranial vessels.  Posterior circulation: RIGHT vertebral artery is dominant. The LEFT vertebral artery appears to terminate in the posterior inferior cerebellar artery. Suspected high-grade stenosis of the proximal basilar artery. Basilar artery is patent. Main branch vessels grossly normal though motion degrades sensitivity. Robust LEFT posterior communicating artery. Normal flow related enhancement of the posterior cerebral arteries.  No large vessel occlusion, aneurysm. Mild luminal irregularity consistent with atherosclerosis.  IMPRESSION: Motion degraded examination without large vessel occlusion.  Irregular appearance of bilateral cavernous carotid arteries, which is likely related to calcific atherosclerosis though, superimposed outpouching along the lateral aspect of the LEFT cavernous carotid artery may reflect atherosclerosis, artifact or potentially small extradural aneurysm.  Suspected high-grade stenosis of the the proximal basilar artery. At least  high-grade stenosis of proximal RIGHT M2 branch.  Luminal irregularity of the cerebral arteries most consistent with atherosclerosis.  Constellation of findings would be better demonstrated on CT angiogram of the head as clinically indicated.   Electronically Signed   By: Awilda Metroourtnay  Bloomer   On: 12/23/2014 03:50   Mr Brain Wo Contrast  12/22/2014   CLINICAL DATA:  Speech disturbance beginning 430 this morning. Headache. Blurred vision.  EXAM: MRI HEAD WITHOUT CONTRAST  TECHNIQUE: Multiplanar, multiecho pulse sequences of the brain and surrounding structures were obtained without intravenous contrast.  COMPARISON:  Head CT same day  FINDINGS: Diffusion imaging does not show any acute or subacute infarction. The brainstem and cerebellum  are unremarkable. The cerebral hemispheres show advanced chronic small vessel change throughout the deep and subcortical hemispheric white matter. No cortical or large vessel territory infarction. No mass lesion, hemorrhage, hydrocephalus or extra-axial collection. No pituitary mass. No inflammatory sinus disease. No skull or skullbase lesion. Major vessels at the base of the brain show flow.  IMPRESSION: No acute finding. Advanced chronic small vessel ischemic changes throughout the cerebral hemispheric white matter.   Electronically Signed   By: Paulina FusiMark  Shogry M.D.   On: 12/22/2014 11:37       Medications:    Infusions:    Scheduled Medications: . aspirin  325 mg Oral Daily  . calcium-vitamin D  1 tablet Oral Q breakfast  . enoxaparin (LOVENOX) injection  40 mg Subcutaneous Q24H  . fluticasone  2 spray Each Nare Daily  . loratadine  10 mg Oral Daily  . pantoprazole  20 mg Oral Daily  . pravastatin  40 mg Oral Daily  . sodium chloride  3 mL Intravenous Q12H    PRN Medications: acetaminophen, polyethylene glycol, senna-docusate   Assessment/ Plan:    Principal Problem:   TIA (transient ischemic attack) Active Problems:   HTN (hypertension)   HLD (hyperlipidemia)   History of CVA (cerebrovascular accident) without residual deficits   Vitamin D deficiency   Allergic rhinitis   Osteoporosis   Chronic anemia   Leukocytosis   History of pleural effusion  #TIA Aphasia now resolved, most consistent with TIA. Imaging consistent with atherosclerosis of multiple intracranial arteries, which is likely the source of presumed emboli.  Carotid dopplers and echo unremarkable.  Neuro recommends increasing aspirin to 325 mg daily.  Concern for GI bleeding at higher dose, so will start prophylactic PPI.  Will not pursue loop monitor given advanced age.  No recs per PT okay with D/C home. -Aspirin 325 mg daily. -Protonix 20 mg daily. -Pravastatin 40 mg daily.  #Hypertension BP in 180s  systolic off of home meds.  Will resume at discharge. -Resume home metoprolol, hydralazine, and Lasix tomorrow.   DVT PPX - low molecular weight heparin  CODE STATUS - Full.  CONSULTS PLACED - Neurology.  DISPO - Discharge home today.   The patient does have a current PCP (HAMRICK,[name], MD) and [facility] after discharge.    [facility] a one-time only appointment? not applicable.  Does the patient have transportation limitations that hinder transportation to clinic appointments? yes   SERVICE NEEDED AT DISCHARGE - TO BE DETERMINED DURING HOSPITAL COURSE         Y = Yes, Blank = No PT:   OT:   RN:   Equipment:   Other:      Length of Stay: 1 day(s)   Signed: Luisa DagoEverett Jynesis Nakamura,  MD  PGY-1, Internal Medicine Resident Pager: [phone] (7AM-5PM) 12/23/2014, 3:03 PM

## 2014-12-23 NOTE — Evaluation (Signed)
Physical Therapy Evaluation Patient Details Name: [name] Fry E Pricer MRN: [id number] DOB: 09-Dec-1919 Today's Date: 12/23/2014   History of Present Illness  Patient is a 81 y/o female with PMHx of HTN, HLD, and CVA (2006) who presented to the ED with aphasia. Per family, when she woke up at 7:15 on 1/31, pt was rambling and her sentences were not making sense. Granddaughter reports that she would speak and shake her head as if confused or aware that she was not speaking clearly. MRI and head CT unremarkable. Workup pending.    Clinical Impression  Patient presents close to functional baseline and able to tolerate ambulating community distances without difficulty. Per family, pt is baseline with speech and no other deficits reported or noted. Pt does not require skilled therapy services as pt is supervision for all mobility and has necessary assist at home from family. Discharge from therapy.    Follow Up Recommendations No PT follow up;Supervision for mobility/OOB    Equipment Recommendations  None recommended by PT    Recommendations for Other Services       Precautions / Restrictions Precautions Precautions: Fall Restrictions Weight Bearing Restrictions: No      Mobility  Bed Mobility Overal bed mobility: Needs Assistance Bed Mobility: Supine to Sit     Supine to sit: Supervision     General bed mobility comments: Supervision for safety.  Transfers Overall transfer level: Needs assistance Equipment used: Rolling walker (2 wheeled) Transfers: Sit to/from Stand Sit to Stand: Supervision         General transfer comment: Supervision for safety.   Ambulation/Gait Ambulation/Gait assistance: Supervision Ambulation Distance (Feet): 200 Feet Assistive device: Rolling walker (2 wheeled) Gait Pattern/deviations: Step-through pattern;Decreased stride length;Trunk flexed     General Gait Details: Pt with slow, steady gait. No LOB.   Stairs            Wheelchair  Mobility    Modified Rankin (Stroke Patients Only)       Balance Overall balance assessment: Needs assistance Sitting-balance support: Feet supported;No upper extremity supported Sitting balance-Leahy Scale: Good Sitting balance - Comments: Able to donn gown sitting EOB without difficulty.    Standing balance support: During functional activity Standing balance-Leahy Scale: Poor Standing balance comment: Requires BUE support on RW for balance/safety.                              Pertinent Vitals/Pain Pain Assessment: No/denies pain    Home Living Family/patient expects to be discharged to:: Private residence Living Arrangements: Children Available Help at Discharge: Family;Available 24 hours/day Type of Home: House Home Access: Stairs to enter Entrance Stairs-Rails: None Entrance Stairs-Number of Steps: 1 Home Layout: One level Home Equipment: Walker - 2 wheels;Bedside commode      Prior Function Level of Independence: Needs assistance   Gait / Transfers Assistance Needed: Pt using RW for ambulation PTA. Reports no falls since hip fx 5 months ago.  ADL's / Homemaking Assistance Needed: Assist with baths from daughter. Family does all IADls and drives.        Hand Dominance   Dominant Hand: Right    Extremity/Trunk Assessment   Upper Extremity Assessment: Defer to OT evaluation           Lower Extremity Assessment: Overall WFL for tasks assessed         Communication   Communication: No difficulties  Cognition Arousal/Alertness: Awake/alert Behavior During Therapy: WFL for tasks assessed/performed  Overall Cognitive Status: History of cognitive impairments - at baseline (Stated it was "June")                      General Comments      Exercises        Assessment/Plan    PT Assessment Patent does not need any further PT services  PT Diagnosis     PT Problem List    PT Treatment Interventions     PT Goals (Current goals  can be found in the Care Plan section) Acute Rehab PT Goals PT Goal Formulation: All assessment and education complete, DC therapy    Frequency     Barriers to discharge        Co-evaluation               End of Session Equipment Utilized During Treatment: Gait belt Activity Tolerance: Patient tolerated treatment well Patient left: in chair;with call bell/phone within reach;with chair alarm set;with family/visitor present Nurse Communication: Mobility status    Functional Assessment Tool Used: Clinical judgment Functional Limitation: Mobility: Walking and moving around Mobility: Walking and Moving Around Current Status (B1478): At least 1 percent but less than 20 percent impaired, limited or restricted Mobility: Walking and Moving Around Goal Status [phone]): At least 1 percent but less than 20 percent impaired, limited or restricted Mobility: Walking and Moving Around Discharge Status [phone]): At least 1 percent but less than 20 percent impaired, limited or restricted    Time: 5784-6962 PT Time Calculation (min) (ACUTE ONLY): 21 min   Charges:   PT Evaluation $Initial PT Evaluation Tier I: 1 Procedure     PT G Codes:   PT G-Codes **NOT FOR INPATIENT CLASS** Functional Assessment Tool Used: Clinical judgment Functional Limitation: Mobility: Walking and moving around Mobility: Walking and Moving Around Current Status ([id number]): At least 1 percent but less than 20 percent impaired, limited or restricted Mobility: Walking and Moving Around Goal Status [phone]): At least 1 percent but less than 20 percent impaired, limited or restricted Mobility: Walking and Moving Around Discharge Status [phone]): At least 1 percent but less than 20 percent impaired, limited or restricted    Alvie Heidelberg A 12/23/2014, 12:21 PM Alvie Heidelberg, PT, DPT [phone]

## 2014-12-23 NOTE — Progress Notes (Signed)
Occupational Therapy Evaluation Patient Details Name: [name] Fry MRN: [id number] DOB: 1920/05/08 Today's Date: 12/23/2014    History of Present Illness Patient is a 81 y/o female with PMHx of HTN, HLD, and CVA (2006) who presented to the ED with aphasia. Per family, when she woke up at 7:15 on 1/31, pt was rambling and her sentences were not making sense. Granddaughter reports that she would speak and shake her head as if confused or aware that she was not speaking clearly. MRI and head CT unremarkable. Workup pending.   Clinical Impression   Pt appears close to baseline functionally. Educated family on warning signs/symptoms of CVA using FAST. Family can provide 24/7 assistance as needed. Pt safe to D/C home with 24/7 S when medically stable. OT signing off.     Follow Up Recommendations  No OT follow up;Supervision/Assistance - 24 hour (initially)    Equipment Recommendations  None recommended by OT    Recommendations for Other Services       Precautions / Restrictions Precautions Precautions: Fall Restrictions Weight Bearing Restrictions: No      Mobility Bed Mobility Overal bed mobility: Modified Independent Bed Mobility: Supine to Sit;Sit to Supine     Supine to sit: Supervision     General bed mobility comments: Supervision for safety.  Transfers Overall transfer level: Needs assistance Equipment used: Rolling walker (2 wheeled) Transfers: Sit to/from [employer] Sit to Stand: Supervision Stand pivot transfers: Supervision       General transfer comment: Supervision for safety.     Balance Overall balance assessment: Needs assistance Sitting-balance support: Feet supported;No upper extremity supported Sitting balance-Leahy Scale: good     Standing balance support: During functional activity Standing balance-Leahy Scale: Fair Standing balance comment: Able to stand unsupported with S                            ADL  Overall ADL's : At baseline                                       General ADL Comments: Family assists with ADL as needed     Vision  at baseline                   Perception     Praxis Praxis Praxis tested?: Within functional limits    Pertinent Vitals/Pain Pain Assessment: No/denies pain     Hand Dominance Right   Extremity/Trunk Assessment Upper Extremity Assessment Upper Extremity Assessment: Overall WFL for tasks assessed   Lower Extremity Assessment Lower Extremity Assessment: Overall WFL for tasks assessed   Cervical / Trunk Assessment Cervical / Trunk Assessment: Normal   Communication Communication Communication: No difficulties   Cognition Arousal/Alertness: Awake/alert Behavior During Therapy: WFL for tasks assessed/performed Overall Cognitive Status: History of cognitive impairments - at baseline                                        Home Living Family/patient expects to be discharged to:: Private residence Living Arrangements: Children Available Help at Discharge: Family;Available 24 hours/day Type of Home: House Home Access: Stairs to enter [employer] of Steps: 1 Entrance Stairs-Rails: None Home Layout: One level     Bathroom Shower/Tub: Tub/shower unit;Curtain Shower/tub characteristics: Curtain  Bathroom Toilet: Standard Bathroom Accessibility: Yes How Accessible: Accessible via walker Home Equipment: Walker - 2 wheels;Bedside commode          Prior Functioning/Environment Level of Independence: Needs assistance  Gait / Transfers Assistance Needed: Pt using RW for ambulation PTA. Reports no falls since hip fx 5 months ago. ADL's / Homemaking Assistance Needed: Assist with baths from daughter. Family does all IADls and drives.        OT Diagnosis: Generalized weakness   OT Problem List: Decreased activity tolerance   OT Treatment/Interventions:      OT Goals(Current goals can  be found in the care plan section) Acute Rehab OT Goals Patient Stated Goal: to go home OT Goal Formulation: All assessment and education complete, DC therapy  OT Frequency:     Barriers to D/C:            Co-evaluation              End of Session Nurse Communication: Mobility status  Activity Tolerance: Patient tolerated treatment well Patient left: in bed;with call bell/phone within reach;with family/visitor present   Time: [id number] OT Time Calculation (min): 17 min Charges:  OT General Charges $OT Visit: 1 Procedure OT Evaluation $Initial OT Evaluation Tier I: 1 Procedure G-Codes: OT G-codes **NOT FOR INPATIENT CLASS** Functional Assessment Tool Used: clinical judgement Functional Limitation: Self care Self Care Current Status (V4098(G8987): At least 1 percent but less than 20 percent impaired, limited or restricted Self Care Goal Status (J1914(G8988): At least 1 percent but less than 20 percent impaired, limited or restricted Self Care Discharge Status [phone]): At least 1 percent but less than 20 percent impaired, limited or restricted  Fanny Agan,HILLARY 12/23/2014, 3:21 PM   [facility] Hari Casaus, OTR/L  [phone] 12/23/2014

## 2014-12-23 NOTE — Progress Notes (Signed)
STROKE TEAM PROGRESS NOTE   HISTORY [name] Fry is a 81 y.o. female with a history of hypertension who was last seen normal at 4:30 am 12/22/2014 ([facility]) when she was up with her granddaughter. She went back to sleep and on awakening thereafter, was noted to have severe speech difficulty. Initially she would not follow commands, or speak, but was awake and interactive. She has improved but still has difficultly. Patient was not administered TPA secondary to outside of 3 hour window. She was admitted for further evaluation and treatment.  SUBJECTIVE (INTERVAL HISTORY) Her family is at the bedside. Overall she feels her condition is back to her baseline. As per daughter, she does have an episode of slurry speech yesterday with questionable left facial droop, quickly resolved. Daughters stated that patient had similar episode about 10 years ago.  OBJECTIVE Temp:  [98.1 F (36.7 C)-99.6 F (37.6 C)] 98.4 F (36.9 C) (02/01 1412) Pulse Rate:  [60-83] 66 (02/01 1412) Cardiac Rhythm:  [-] Normal sinus rhythm (02/01 0819) Resp:  [15-27] 20 (02/01 1412) BP: (123-182)/(51-84) 180/78 mmHg (02/01 1412) SpO2:  [96 %-98 %] 96 % (02/01 1412)  No results for input(s): GLUCAP in the last 168 hours.  Recent Labs Lab 12/22/14 0824 12/22/14 0835 12/23/14 0726  NA 133* 136 135  K 4.0 4.0 3.6  CL 94* 96 100  CO2 29  --  30  GLUCOSE 126* 123* 91  BUN 7 8 10   CREATININE 0.67 0.70 0.75  CALCIUM 9.3  --  8.1*    Recent Labs Lab 12/22/14 0824  AST 22  ALT 9  ALKPHOS 63  BILITOT 1.3*  PROT 7.5  ALBUMIN 3.4*    Recent Labs Lab 12/22/14 0824 12/22/14 0835 12/22/14 1846 12/23/14 0726  WBC 11.3*  --  11.7* 9.0  NEUTROABS 6.8  --  8.7*  --   HGB 13.3 16.3* 10.9* 10.9*  HCT 40.9 48.0* 33.6* 33.8*  MCV 80.2  --  78.7 79.7  PLT 374  --  342 338   No results for input(s): CKTOTAL, CKMB, CKMBINDEX, TROPONINI in the last 168 hours.  Recent Labs  12/22/14 0824  LABPROT 15.5*  INR 1.21     Recent Labs  12/22/14 0903  COLORURINE YELLOW  LABSPEC 1.014  PHURINE 7.5  GLUCOSEU NEGATIVE  HGBUR NEGATIVE  BILIRUBINUR NEGATIVE  KETONESUR NEGATIVE  PROTEINUR 30*  UROBILINOGEN 0.2  NITRITE NEGATIVE  LEUKOCYTESUR NEGATIVE       Component Value Date/Time   CHOL 125 12/23/2014 0726   TRIG 84 12/23/2014 0726   HDL 52 12/23/2014 0726   CHOLHDL 2.4 12/23/2014 0726   VLDL 17 12/23/2014 0726   LDLCALC 56 12/23/2014 0726   No results found for: HGBA1C    Component Value Date/Time   LABOPIA POSITIVE* 12/22/2014 0903   COCAINSCRNUR NONE DETECTED 12/22/2014 0903   LABBENZ NONE DETECTED 12/22/2014 0903   AMPHETMU NONE DETECTED 12/22/2014 0903   THCU NONE DETECTED 12/22/2014 0903   LABBARB NONE DETECTED 12/22/2014 0903     Recent Labs Lab 12/22/14 0824  ETH <5   Dg Chest 2 View 12/23/2014 IMPRESSION: Unchanged moderate right pleural effusion, obscuring evaluation of the right lung base parenchymal. There is a small left pleural effusion that is increased from prior. Stable cardiomegaly.   Ct Head Wo Contrast 12/22/2014 IMPRESSION: Mild diffuse cortical atrophy. Mild chronic ischemic white matter disease. No acute intracranial abnormality seen.   Mr Maxine GlennMra Head Wo Contrast 12/23/2014 IMPRESSION: Motion degraded examination without large  vessel occlusion. Irregular appearance of bilateral cavernous carotid arteries, which is likely related to calcific atherosclerosis though, superimposed outpouching along the lateral aspect of the LEFT cavernous carotid artery may reflect atherosclerosis, artifact or potentially small extradural aneurysm. Suspected high-grade stenosis of the the proximal basilar artery. At least high-grade stenosis of proximal RIGHT M2 branch. Luminal irregularity of the cerebral arteries most consistent with atherosclerosis. Constellation of findings would be better demonstrated on CT angiogram of the head as clinically indicated.   Mr Brain Wo  Contrast 12/22/2014 IMPRESSION: No acute finding. Advanced chronic small vessel ischemic changes throughout the cerebral hemispheric white matter.   2D Echocardiogram  - Left ventricle: The cavity size was normal. Wall thickness wasnormal. Systolic function was normal. The estimated ejectionfraction was in the range of 55% to 60%. Wall motion was normal;there were no regional wall motion abnormalities. Features areconsistent with a pseudonormal left ventricular filling pattern,with concomitant abnormal relaxation and increased fillingpressure (grade 2 diastolic dysfunction). Doppler parameters areconsistent with elevated mean left atrial filling pressure. - Mitral valve: There was mild to moderate regurgitation directedcentrally. - Left atrium: The atrium was moderately dilated. - Right atrium: The atrium was mildly dilated. - Atrial septum: No defect or patent foramen ovale was identified. - Tricuspid valve: There was mild-moderate regurgitation directedcentrally. - Pulmonary arteries: Systolic pressure was moderately increased.PA peak pressure: 50 mm Hg (S).  Carotid Doppler 1-39% ICA stenosis. Vertebral artery flow is antegrade. Tortuous vessels noted.   PHYSICAL EXAM  Temp:  [98.1 F (36.7 C)-99.3 F (37.4 C)] 98.4 F (36.9 C) (02/01 1412) Pulse Rate:  [60-79] 66 (02/01 1412) Resp:  [15-20] 20 (02/01 1412) BP: (123-180)/(51-84) 180/78 mmHg (02/01 1412) SpO2:  [96 %-98 %] 96 % (02/01 1412)  General - Well nourished, well developed, in no apparent distress.  Ophthalmologic - fundi not visualized due to incorporation.  Cardiovascular - Regular rate and rhythm with no murmur.  Mental Status -  Level of arousal and orientation to place and person were intact, but not to time and president. Language exam showed short sentences, paucity of speech, follows simple commands, not complex commands, impaired on naming and repetition.  Cranial Nerves II - XII - II -  Visual field intact OU. III, IV, VI - Extraocular movements intact. V - Facial sensation intact bilaterally. VII - Facial movement intact bilaterally. VIII - Hard of hearing & vestibular intact bilaterally. X - Palate elevation difficult to exam. XI - Chin turning & shoulder shrug intact bilaterally. XII - Tongue protrusion intact.  Motor Strength - The patient's strength was 4/5 in all extremities and pronator drift was absent.  Bulk was normal and fasciculations were absent.   Motor Tone - Muscle tone was assessed at the neck and appendages and was normal.  Reflexes - The patient's reflexes were 1+ in all extremities and she had no pathological reflexes.  Sensory - Light touch, temperature/pinprickwere assessed and were symmetrical.    Coordination - The patient had normal movements in the hands with no ataxia or dysmetria.  Tremor was absent.  Gait and Station - not tested due to safety concerns.  ASSESSMENT/PLAN [name] Fry is a 81 y.o. female with history of hypertension presenting with transient speech difficulty and the questionable of left facial droop. She did not receive IV t-PA due to delay in arrival.   TIA - due to questionable left facial droop, TIA may be from right hemisphere  Resultant Speech difficulty resolved  MRI No acute stroke  MRA No  large vessel stenosis  Carotid Doppler No significant stenosis   2D Echo No source of embolus   HgbA1c pending  Lovenox 40 mg sq daily for VTE prophylaxis  aspirin 81 mg orally every day prior to admission, changed to  aspirin 325 mg orally every day  Patient counseled to be compliant with her antithrombotic medications  Ongoing aggressive stroke risk factor management  Therapy recommendations:  NO PT  Disposition:  Return home  Hypertension  On the high side  Home meds including Lasix, hydralazine, metoprolol  Currently on hold for permissive hypertension  May consider to resume home  meds  Blood pressure goal normotensive  Hyperlipidemia  Home meds: pravachol 40 mg daily, resumed in hospital  LDL 56, goal < 70  Continue statin at discharge  Other Stroke Risk Factors  Advanced age  Hospital day # 1  [facility] See Amion for Pager information 12/23/2014 2:35 PM   I, the attending vascular neurologist, have personally obtained a history, examined the patient, evaluated laboratory data, individually viewed imaging studies and agree with radiology interpretations. I also obtained additional history from pt's daughter at bedside. I also discussed with Dr. Wyonia Hough regarding his care plan. Together with the NP/PA, we formulated the assessment and plan of care which reflects our mutual decision.  I have made any additions or clarifications directly to the above note and agree with the findings and plan as currently documented.   81 year old female with history of hypertension admitted for transient slurred speech with questionable left facial droop. MRI/MRA negative for acute stroke or large vessel stenosis. Consider TIA. Increase aspirin to full dose 325. Continue statin. Risk factor modification.  Neurology will sign off. Please call with questions. Thanks for the consult.  Marvel Plan, MD PhD Stroke Neurology 12/23/2014 4:12 PM       To contact Stroke Continuity provider, please refer to [email or web address]. After hours, contact General Neurology'

## 2014-12-23 NOTE — Progress Notes (Signed)
Subjective:    Currently, [name] Fry is doing well, much improved since yesterday. She was initially sleeping comfortably, but was pleasant upon awakening. She reports that she ate a big breakfast this morning consisting of sausage and applesauce. She also states that she feels much better and is ready to go home. She denied headache, chest pain, nausea/vomiting. Her speech is quite clear and she is able to articulate thoughts well.    Objective:    Vital Signs:   Temp:  [98.1 F (36.7 C)-99.6 F (37.6 C)] 98.4 F (36.9 C) (02/01 1022) Pulse Rate:  [60-83] 65 (02/01 1022) Resp:  [14-27] 20 (02/01 1022) BP: (123-183)/(51-86) 155/65 mmHg (02/01 1022) SpO2:  [96 %-98 %] 97 % (02/01 1022) Last BM Date:  (prior to admission; pt not sure)  24-hour weight change: Weight change: n/a  Intake/Output: n/a     Physical Exam: General: sleeping in bed, easily awakened, NAD,alert and cooperative with exam HEENT: PERRL, EOMI, anicteric sclera, oropharynx clear, MMM Heart: systolic ejection murmur at RUSB, normal S1, fixed splitting of S2, regular rate Lungs: CTAB, no wheezes, normal work of breathing Ext: no pedal edema, warm/well-perfused Neuro: CN II-XII grossly intact, 5/5 upper extremity strength, 4/5 bilateral lower extremity strength, 2+ grip strength, AOx3  Labs:  Basic Metabolic Panel:  Recent Labs Lab 12/23/14 0726  NA 135  K 3.6  CL 100  CO2 30  GLUCOSE 91  BUN 10  CREATININE 0.75  CALCIUM 8.1*    Liver Function Tests:  Recent Labs Lab 12/22/14 0824  AST 22  ALT 9  ALKPHOS 63  BILITOT 1.3*  PROT 7.5  ALBUMIN 3.4*    CBC:  Recent Labs Lab 12/22/14 1846 12/23/14 0726  WBC 11.7* 9.0  NEUTROABS 8.7*  --   HGB 10.9* 10.9*  HCT 33.6* 33.8*  MCV 78.7 79.7  PLT 342 338   Lipid Panel     Component Value Date/Time   CHOL 125 12/23/2014 0726   TRIG 84 12/23/2014 0726   HDL 52 12/23/2014 0726   CHOLHDL 2.4 12/23/2014 0726   VLDL 17 12/23/2014 0726     LDLCALC 56 12/23/2014 0726    TSH: 2.138 (12/23/2014 0726)   Imaging:  DG Chest 2 View  CLINICAL DATA: 81 year old female with history of pleural effusions. Hypertension.EXAM: CHEST 2 VIEW. COMPARISON: 07/10/2014. FINDINGS: Moderate right pleural effusion appears similar to prior exam. There is a small left pleural effusion that has increased. Mild cardiomegaly and atherosclerosis of the thoracic aorta, stable from prior. Pulmonary vasculature is normal. No pneumothorax. Severe degenerative change of both shoulders again seen. IMPRESSION: Unchanged moderate right pleural effusion, obscuring evaluation of the right lung base parenchymal. There is a small left pleural effusion that is increased from prior. Stable cardiomegaly. Electronically Signed By: Rubye Oaks M.D. On: 12/23/2014 03:48  MR MRA HEAD WO CONTRAST  CLINICAL DATA: Aphasia. History of hypertension, hyperlipidemia and stroke. EXAM: MRA HEAD WITHOUT CONTRAST. TECHNIQUE: Angiographic images of the Circle of Willis were obtained using MRA technique without intravenous contrast. COMPARISON: MRI of the brain December 22, 2014 and CT of the head December 22, 2014. FINDINGS: Mildly motion degraded examination. Anterior circulation: Normal flow related enhancement of the included cervical, petrous internal carotid arteries. The irregular though patent bilateral cavernous and to lesser extent supra clinoid internal carotid arteries corresponding to extensive calcifications on prior CT. There is focal luminal irregularity and apparent 4 mm laterally directed outpouching of the LEFT cavernous carotid artery. Patent anterior communicating artery. Preserved  Flow related enhancement of the anterior and middle cerebral arteries, including more distal segments. At least midgrade stenosis of RIGHT M2 branch proximally. No large vessel occlusion, high-grade stenosis. Diffuse mild luminal irregularity of the intracranial vessels. Posterior  circulation: RIGHT vertebral artery is dominant. The LEFT vertebral artery appears to terminate in the posterior inferior cerebellar artery. Suspected high-grade stenosis of the proximal basilar artery. Basilar artery is patent. Main branch vessels grossly normal though motion degrades sensitivity. Robust LEFT posterior communicating artery. Normal flow related enhancement of the posterior cerebral arteries. No large vessel occlusion, aneurysm. Mild luminal irregularity consistent with atherosclerosis. IMPRESSION: Motion degraded examination without large vessel occlusion. Irregular appearance of bilateral cavernous carotid arteries, which is likely related to calcific atherosclerosis though, superimposed outpouching along the lateral aspect of the LEFT cavernous carotid artery may reflect atherosclerosis, artifact or potentially small extradural aneurysm. Suspected high-grade stenosis of the the proximal basilar artery. At least high-grade stenosis of proximal RIGHT M2 branch. Luminal irregularity of the cerebral arteries most consistent with atherosclerosis. Constellation of findings would be better demonstrated on CT angiogram of the head as clinically indicated. Electronically Signed By: Awilda Metro On: 12/23/2014 03:50    Medications:    Infusions:    Scheduled Medications: . aspirin  325 mg Oral Daily  . calcium-vitamin D  1 tablet Oral Q breakfast  . enoxaparin (LOVENOX) injection  40 mg Subcutaneous Q24H  . fluticasone  2 spray Each Nare Daily  . loratadine  10 mg Oral Daily  . pantoprazole  20 mg Oral Daily  . pravastatin  40 mg Oral Daily  . sodium chloride  3 mL Intravenous Q12H    PRN Medications: acetaminophen, polyethylene glycol, senna-docusate   Assessment/ Plan:    [name] is a 81yo female with PMHx of HTN, HLD, and CVA (2006) who was admitted on 12/22/2014 for symptoms of aphasia. which was determined to be secondary to TIA. Interventions at this time will be  focused on ruling out stroke and further evaluating underlying causes of TIA.   Aphasia: Likely due to TIA due to acute onset with gradual improvement as well as pre-existing risk factors of HLD, HTN, prior CVA. CT/MRI on 12/22/14 showed no acute findings. MRA obtained 12/23/14 showed no large vessel occlusion, but irregular appearance of bilateral cavernous carotid arteries, which is likely related to calcific atherosclerosis vs potentially small extradural aneurysm; stenosis of proximal basilar artery, high grade stenosis of proximal right M2 branch. Carotid dopplers 12/23/14 showed 1-39% ICA stenosis and antegrade vertebral artery flow. Symptoms have resolved and pt appears to be back to her baseline for speech and activity.  -- results of echo, HbA1c pending -- Aspirin  -- add  pantoprazole daily -- d/c continuous cardiac monitoring + pulse ox. Would like Neuro/Cardio recs regarding ambulatory cardiac monitoring -- awaiting OT consult   Hypertension: Treated at home with  metoprolol,  hydralazine, and  lasix. Systolic BP 250 12/22/14 per EMS.  Pt treated with labetalol in ED. Systolic BP 123-155 overnight --continue to hold home meds -- routine vital sign checks  Hyperlipidemia: lipid panel within normal limits. -- continue  pravastatin  FEN/GI: -- Heart-healthy diet -- d/c IV fluids in preparation for discharge -- continue calcium-vitamin D. Vitamin D lab pending -- continue home miralax  PPx: -- d/c Lovenox at time of discharge  Code status: FULL CODE  Consults Placed: -- Neurology (Dr. Amada Jupiter): suspected temporal lobe ischemia. Recommended stroke workup w/o tPA.  -- Cardiology: performed echo, vascular labs  Dispo: Pt status appropriate for discharge to home under care of family pending results of echocardiogram.   The patient does have a current PCP (Ailene RavelMaura L Hamrick, MD) and [facility] discharge TBD.  The  patient does not know have transportation limitations that hinder transportation to clinic appointments.  SERVICE NEEDED AT DISCHARGE - TO BE DETERMINED DURING HOSPITAL COURSE         Y = Yes, Blank = No PT:   OT:   RN:   Equipment:   Other:     Length of Stay: 1 day(s)   Signed: Clovis FredricksonBenjamin G Jhonatan Lomeli, Med Student  Pager: [phone] (7AM-5PM) 12/23/2014, 12:40 PM

## 2014-12-23 NOTE — Discharge Instructions (Signed)
·   Thank you for allowing us to be involved in your healthcare while [facility].   Please note that there have been changes to your home medications.  --> PLEASE LOOK AT YOUR DISCHARGE MEDICATION LIST FOR DETAILS.   Please call your PCP if you have any questions or concerns, or any difficulty getting any of your medications.  Please return to the ER if you have worsening of your symptoms or new severe symptoms arise.  Please resume your home blood pressure medications tomorrow.  We increased your dose of aspirin to 325 mg daily to prevent future strokes.  We would like you to take Protonix to prevent gastritis on this higher dose of aspirin.

## 2014-12-23 NOTE — Progress Notes (Signed)
VASCULAR LAB PRELIMINARY  PRELIMINARY  PRELIMINARY  PRELIMINARY  Carotid Dopplers completed.    Preliminary report:  1-39% ICA stenosis.  Vertebral artery flow is antegrade.  Tortuous vessels noted.   Grettell Ransdell, RVT 12/23/2014, 10:20 AM

## 2014-12-23 NOTE — Progress Notes (Addendum)
Patient has been slightly febrile though out the night up to 99 she is resting comfortably will continue to monitor.

## 2014-12-23 NOTE — Progress Notes (Signed)
UR completed 

## 2014-12-23 NOTE — Progress Notes (Signed)
D/C orders received. Pt and family educated on d/c instructions and stroke education. RN answered all questions. Pt and family verbalized understanding. IV and tele removed. Pt dressed and belongings packed. Pt taken downstairs by staff via wheelchair

## 2014-12-24 LAB — HEMOGLOBIN A1C
Hgb A1c MFr Bld: 6 % — ABNORMAL HIGH (ref 4.8–5.6)
Mean Plasma Glucose: 126 mg/dL

## 2014-12-24 LAB — VITAMIN D 25 HYDROXY (VIT D DEFICIENCY, FRACTURES): Vit D, 25-Hydroxy: 31.8 ng/mL (ref 30.0–100.0)

## 2014-12-26 ENCOUNTER — Encounter (HOSPITAL_COMMUNITY): Payer: Self-pay | Admitting: *Deleted

## 2014-12-26 ENCOUNTER — Emergency Department (HOSPITAL_COMMUNITY)
Admission: EM | Admit: 2014-12-26 | Discharge: 2014-12-26 | Disposition: A | Discharge status: Home / Self Care | Payer: PPO | Attending: Emergency Medicine | Admitting: Emergency Medicine

## 2014-12-26 DIAGNOSIS — I159 Secondary hypertension, unspecified: Secondary | ICD-10-CM | POA: Insufficient documentation

## 2014-12-26 DIAGNOSIS — Z79899 Other long term (current) drug therapy: Secondary | ICD-10-CM | POA: Diagnosis not present

## 2014-12-26 DIAGNOSIS — I10 Essential (primary) hypertension: Secondary | ICD-10-CM | POA: Diagnosis present

## 2014-12-26 DIAGNOSIS — E780 Pure hypercholesterolemia: Secondary | ICD-10-CM | POA: Insufficient documentation

## 2014-12-26 DIAGNOSIS — Z8673 Personal history of transient ischemic attack (TIA), and cerebral infarction without residual deficits: Secondary | ICD-10-CM | POA: Diagnosis not present

## 2014-12-26 DIAGNOSIS — Z7982 Long term (current) use of aspirin: Secondary | ICD-10-CM | POA: Diagnosis not present

## 2014-12-26 LAB — COMPREHENSIVE METABOLIC PANEL
ALT: 8 U/L (ref 0–35)
AST: 21 U/L (ref 0–37)
Albumin: 3.2 g/dL — ABNORMAL LOW (ref 3.5–5.2)
Alkaline Phosphatase: 53 U/L (ref 39–117)
Anion gap: 9 (ref 5–15)
BUN: 8 mg/dL (ref 6–23)
CHLORIDE: 98 mmol/L (ref 96–112)
CO2: 27 mmol/L (ref 19–32)
CREATININE: 0.65 mg/dL (ref 0.50–1.10)
Calcium: 8.8 mg/dL (ref 8.4–10.5)
GFR calc Af Amer: 87 mL/min — ABNORMAL LOW (ref >90)
GFR, EST NON AFRICAN AMERICAN: 75 mL/min — AB (ref >90)
GLUCOSE: 86 mg/dL (ref 70–99)
Potassium: 3.8 mmol/L (ref 3.5–5.1)
SODIUM: 134 mmol/L — AB (ref 135–145)
TOTAL PROTEIN: 7.2 g/dL (ref 6.0–8.3)
Total Bilirubin: 1.4 mg/dL — ABNORMAL HIGH (ref 0.3–1.2)

## 2014-12-26 LAB — CBC WITH DIFFERENTIAL/PLATELET
BASOS PCT: 0 % (ref 0–1)
Basophils Absolute: 0 10*3/uL (ref 0.0–0.1)
EOS ABS: 0.1 10*3/uL (ref 0.0–0.7)
EOS PCT: 2 % (ref 0–5)
HEMATOCRIT: 38.6 % (ref 36.0–46.0)
Hemoglobin: 12.7 g/dL (ref 12.0–15.0)
Lymphocytes Relative: 19 % (ref 12–46)
Lymphs Abs: 1.6 10*3/uL (ref 0.7–4.0)
MCH: 26.5 pg (ref 26.0–34.0)
MCHC: 32.9 g/dL (ref 30.0–36.0)
MCV: 80.6 fL (ref 78.0–100.0)
Monocytes Absolute: 0.8 10*3/uL (ref 0.1–1.0)
Monocytes Relative: 10 % (ref 3–12)
Neutro Abs: 5.6 10*3/uL (ref 1.7–7.7)
Neutrophils Relative %: 69 % (ref 43–77)
Platelets: 351 10*3/uL (ref 150–400)
RBC: 4.79 MIL/uL (ref 3.87–5.11)
RDW: 16 % — ABNORMAL HIGH (ref 11.5–15.5)
WBC: 8.1 10*3/uL (ref 4.0–10.5)

## 2014-12-26 MED ORDER — ONDANSETRON HCL 4 MG/2ML IJ SOLN
4.0000 mg | Freq: Once | INTRAMUSCULAR | Status: AC
  Administered 2014-12-26: 4 mg via INTRAVENOUS
  Filled 2014-12-26: qty 2

## 2014-12-26 MED ORDER — ONDANSETRON HCL 4 MG PO TABS: 4.0000 mg | ORAL_TABLET | Freq: Four times a day (QID) | ORAL | Status: DC

## 2014-12-26 MED ORDER — HYDRALAZINE HCL 20 MG/ML IJ SOLN: 10.0000 mg | Freq: Once | INTRAMUSCULAR | Status: DC

## 2014-12-26 MED ORDER — HYDRALAZINE HCL 50 MG PO TABS: 75.0000 mg | ORAL_TABLET | Freq: Three times a day (TID) | ORAL | Status: DC

## 2014-12-26 NOTE — Discharge Instructions (Signed)
Hypertension Hypertension is another name for high blood pressure. High blood pressure forces your heart to work harder to pump blood. A blood pressure reading has two numbers, which includes a higher number over a lower number (example: 110/72). HOME CARE   Have your blood pressure rechecked by your doctor.  Only take medicine as told by your doctor. Follow the directions carefully. The medicine does not work as well if you skip doses. Skipping doses also puts you at risk for problems.  Do not smoke.  Monitor your blood pressure at home as told by your doctor. GET HELP IF:  You think you are having a reaction to the medicine you are taking.  You have repeat headaches or feel dizzy.  You have puffiness (swelling) in your ankles.  You have trouble with your vision. GET HELP RIGHT AWAY IF:   You get a very bad headache and are confused.  You feel weak, numb, or faint.  You get chest or belly (abdominal) pain.  You throw up (vomit).  You cannot breathe very well. MAKE SURE YOU:   Understand these instructions.  Will watch your condition.  Will get help right away if you are not doing well or get worse. Document Released: 04/26/2008 Document Revised: 11/13/2013 Document Reviewed: 08/31/2013 Exit[employer] Patient Information 2015 [employer]. This information is not intended to replace advice given to you by your health care provider. Make sure you discuss any questions you have with your health care provider.  

## 2014-12-26 NOTE — ED Notes (Addendum)
Pt arrives from home via [facility]. Pt has reported HTN and didn't feel comfortable waiting until 1545 to see PCP. Pt reports she was having a h/a off and on earlier today. Pt reports she was vomiting yesterday and was unable to take her medications. Pt states she ate and took her medications today with no n/v. Pt was hospitalized a month ago rt a "small CVA". Pt denies, h/a, weakness, dizziness, and is a/o x4.

## 2014-12-26 NOTE — ED Provider Notes (Signed)
CSN: [id number]     Arrival date & time 12/26/14  1538 History   First MD Initiated Contact with Patient 12/26/14 1544     Chief Complaint  Patient presents with  . Hypertension     (Consider location/radiation/quality/duration/timing/severity/associated sxs/prior Treatment) HPI  81 year old female past medical history of hypertension, prior stroke, recent admission for TIA and hypertension who presents to ED today complaining of intermittent headache earlier today as well as elevated blood pressure. Patient states yesterday she also had a few episodes of vomiting which has since resolved. She states yesterday she did not take any of her medications because of this. She states this morning she felt better and was able to take all her medications as instructed. She does state she has some mild generalized headache earlier today which has resolved spontaneously. She denies having any weakness, dizziness, speech change, confusion, numbness, tingling, abdominal pain, nausea, vomiting, change in gait. Patient normal walks with a walker and states she has been walking as usual. Patient has an appointment with her PCP tomorrow. She states she did not want to wait until then to have her blood pressure checked. No other complaints at this time.   Past Medical History  Diagnosis Date  . Hypertension   . High cholesterol   . Stroke    Past Surgical History  Procedure Laterality Date  . Hernia repair    . Hip arthroplasty Right 07/10/2014    Procedure: ARTHROPLASTY BIPOLAR HIP;  Surgeon: Cheral Almas, MD;  Location: [facility] OR;  Service: Orthopedics;  Laterality: Right;   Family History  Problem Relation Age of Onset  . CAD Neg Hx   . Dementia Other    History  Substance Use Topics  . Smoking status: Never Smoker   . Smokeless tobacco: Not on file  . Alcohol Use: No   OB History    No data available     Review of Systems  Constitutional: Negative for fever and chills.  HENT: Negative  for congestion, rhinorrhea and sore throat.   Eyes: Negative for visual disturbance.  Respiratory: Negative for cough and shortness of breath.   Cardiovascular: Negative for chest pain, palpitations and leg swelling.  Gastrointestinal: Negative for nausea, vomiting, abdominal pain, diarrhea and constipation.  Genitourinary: Negative for dysuria, hematuria, vaginal bleeding and vaginal discharge.  Musculoskeletal: Negative for back pain and neck pain.  Skin: Negative for rash.  Neurological: Positive for headaches. Negative for dizziness, tremors, seizures, syncope, facial asymmetry, speech difficulty, weakness, light-headedness and numbness.  All other systems reviewed and are negative.     Allergies  Review of patient's allergies indicates no known allergies.  Home Medications   Prior to Admission medications   Medication Sig Start Date End Date Taking? Authorizing Provider  acetaminophen (TYLENOL) 325 MG tablet Take 650 mg by mouth every 6 (six) hours as needed for mild pain or moderate pain.    Historical Provider, MD  aspirin 325 MG tablet Take 1 tablet (325 mg total) by mouth daily. 12/23/14   Luisa Dago, MD  calcium-vitamin D (OSCAL WITH D) 500-200 MG-UNIT per tablet Take 1 tablet by mouth daily with breakfast. 07/10/14   Naiping Glee Arvin, MD  feeding supplement, ENSURE COMPLETE, (ENSURE COMPLETE) LIQD Take 237 mLs by mouth 2 (two) times daily between meals. 07/12/14   Richarda Overlie, MD  furosemide (LASIX) 20 MG tablet Take 20 mg by mouth daily.    Historical Provider, MD  hydrALAZINE (APRESOLINE) 50 MG tablet Take 75 mg by mouth  2 (two) times daily.     Historical Provider, MD  HYDROcodone-acetaminophen (NORCO) 5-325 MG per tablet Take 1-2 tablets by mouth every 6 (six) hours as needed. 07/10/14   Naiping Glee ArvinMichael Xu, MD  loratadine (CLARITIN) 10 MG tablet Take 10 mg by mouth daily.    Historical Provider, MD  magnesium hydroxide (MILK OF MAGNESIA) 400 MG/5ML suspension Take 30 mLs  by mouth daily as needed for mild constipation.    Historical Provider, MD  metoprolol (TOPROL-XL) 200 MG 24 hr tablet Take 200 mg by mouth daily.    Historical Provider, MD  pantoprazole (PROTONIX) 20 MG tablet Take 1 tablet (20 mg total) by mouth daily. 12/23/14   Luisa DagoEverett Moding, MD  polyethylene glycol (MIRALAX / GLYCOLAX) packet Take 17 g by mouth daily as needed for mild constipation. 07/12/14   Richarda OverlieNayana Abrol, MD  potassium chloride SA (K-DUR,KLOR-CON) 20 MEQ tablet Take 20 mEq by mouth daily.     Historical Provider, MD  pravastatin (PRAVACHOL) 40 MG tablet Take 40 mg by mouth daily.    Historical Provider, MD  promethazine (PHENERGAN) 25 MG tablet Take 1 tablet (25 mg total) by mouth every 6 (six) hours as needed for nausea. 07/10/14   Naiping Glee ArvinMichael Xu, MD  senna-docusate (SENOKOT S) 8.6-50 MG per tablet Take 1 tablet by mouth at bedtime as needed. Patient not taking: Reported on 12/22/2014 07/10/14   Naiping Glee ArvinMichael Xu, MD  sorbitol 70 % SOLN Take 30 mLs by mouth daily as needed for moderate constipation. Patient not taking: Reported on 12/22/2014 07/12/14   Richarda OverlieNayana Abrol, MD   BP 205/100 mmHg  Pulse 72  Temp(Src) 98.4 F (36.9 C) (Oral)  Resp 16  Wt 110 lb (49.896 kg)  SpO2 99% Physical Exam  Constitutional: She is oriented to person, place, and time. She appears well-developed and well-nourished. No distress.  HENT:  Head: Normocephalic and atraumatic.  Eyes: Conjunctivae are normal.  Neck: Normal range of motion.  Cardiovascular: Normal rate, regular rhythm, normal heart sounds and intact distal pulses.   No murmur heard. Pulmonary/Chest: Effort normal and breath sounds normal. No respiratory distress. She has no wheezes. She has no rales. She exhibits no tenderness.  Abdominal: Soft. Bowel sounds are normal. She exhibits no distension.  Musculoskeletal: Normal range of motion.  Neurological: She is alert and oriented to person, place, and time. No cranial nerve deficit. GCS eye  subscore is 4. GCS verbal subscore is 5. GCS motor subscore is 6.  HDS, AAOx4. PERRL, EOMI, TML, face sym. CN 2-12 grossly intact. 5/5 sym, no drift, SILT, normal  coordination.    Skin: Skin is warm and dry.  Psychiatric: She has a normal mood and affect.  Nursing note and vitals reviewed.   ED Course  Procedures (including critical care time) Labs Review Labs Reviewed  CBC WITH DIFFERENTIAL/PLATELET - Abnormal; Notable for the following:    RDW 16.0 (*)    All other components within normal limits  COMPREHENSIVE METABOLIC PANEL - Abnormal; Notable for the following:    Sodium 134 (*)    Albumin 3.2 (*)    Total Bilirubin 1.4 (*)    GFR calc non Af Amer 75 (*)    GFR calc Af Amer 87 (*)    All other components within normal limits    Imaging Review No results found.   EKG Interpretation   Date/Time:  Thursday December 26 2014 16:24:44 EST Ventricular Rate:  70 PR Interval:  169 QRS Duration: 139 QT Interval:  417 QTC Calculation: 450 R Axis:   30 Text Interpretation:  Sinus rhythm Left atrial enlargement Right bundle  branch block Nonspecific T abnormalities, lateral leads When compared with  ECG of 12/22/2014, No significant change was found when alowing for  differences in chest lead placement Confirmed by [facility]  MD, DAVID ([id number])  on 12/26/2014 5:05:48 PM      MDM   Final diagnoses:  None    [name] Fry is a 81 y.o. female with H&P as above. On arrival, patient appears minimally stable and in no apparent distress with a benign neuro exam. Patient currently asymptomatic with a blood pressure of 190/100. EKG shows RBBB and unchanged TWIs from prior. On review of records patient had extensive TIA workup and was subsequently discharged with a blood pressure of 180 systolic and instructed to continue her normal blood pressure regimen. It appears patient takes metoprolol, hydralazine, Lasix. Also it appears patient was previously on hydralazine once a day last year  and recently changed to twice daily. The logical next step would be to increase her hydralazine to 3 times a day. We'll check screening labs today to evaluate for end organ damage although I have low suspicion at this time.  Workup unremarkable at this time for end organ damage. Patient vital signs have remained stable in the ED.  no indication to treat blood pressure this time. advised patient and family that we would like to change her hydralazine dose to 3 times a day from twice a day. Advised close PCP follow-up. Strict return precautions have been discussed and they I have voiced understanding. Stable for discharge.   Clinical Impression: 1. Secondary hypertension, unspecified     Disposition: Discharge  Condition: Good  I have discussed the results, Dx and Tx plan with the pt(& family if present). He/she/they expressed understanding and agree(s) with the plan. Discharge instructions discussed at great length. Strict return precautions discussed and pt &/or family have verbalized understanding of the instructions. No further questions at time of discharge.    Discharge Medication List as of 12/26/2014  7:14 PM    START taking these medications   Details  ondansetron (ZOFRAN) 4 MG tablet Take 1 tablet (4 mg total) by mouth every 6 (six) hours., Starting 12/26/2014, Until Discontinued, Print        Follow Up: Ailene Ravel, MD Dr. Burnell Blanks [address] [place] [address] [phone]  Schedule an appointment as soon as possible for a visit in 1 week   [facility] EMERGENCY DEPARTMENT [id number] [place] [address] [phone]  If symptoms worsen   Pt seen in conjunction with Dr. Birdena Crandall, DO [facility] Emergency Medicine Resident - PGY-2    Ames Dura, MD 12/27/14 0001  Dione Booze, MD 12/27/14 [phone]

## 2014-12-26 NOTE — ED Notes (Signed)
Pt had vomiting x1 after attempting to eat and drink.

## 2015-12-03 DIAGNOSIS — G459 Transient cerebral ischemic attack, unspecified: Secondary | ICD-10-CM | POA: Diagnosis not present

## 2015-12-03 DIAGNOSIS — M810 Age-related osteoporosis without current pathological fracture: Secondary | ICD-10-CM | POA: Diagnosis not present

## 2015-12-03 DIAGNOSIS — I10 Essential (primary) hypertension: Secondary | ICD-10-CM | POA: Diagnosis not present

## 2015-12-03 DIAGNOSIS — R0981 Nasal congestion: Secondary | ICD-10-CM | POA: Diagnosis not present

## 2015-12-03 DIAGNOSIS — R609 Edema, unspecified: Secondary | ICD-10-CM | POA: Diagnosis not present

## 2015-12-03 DIAGNOSIS — E7800 Pure hypercholesterolemia, unspecified: Secondary | ICD-10-CM | POA: Diagnosis not present

## 2016-01-06 DIAGNOSIS — R609 Edema, unspecified: Secondary | ICD-10-CM | POA: Diagnosis not present

## 2016-01-06 DIAGNOSIS — Z1389 Encounter for screening for other disorder: Secondary | ICD-10-CM | POA: Diagnosis not present

## 2016-01-06 DIAGNOSIS — I10 Essential (primary) hypertension: Secondary | ICD-10-CM | POA: Diagnosis not present

## 2016-01-19 DIAGNOSIS — Z6820 Body mass index (BMI) 20.0-20.9, adult: Secondary | ICD-10-CM | POA: Diagnosis not present

## 2016-01-19 DIAGNOSIS — I10 Essential (primary) hypertension: Secondary | ICD-10-CM | POA: Diagnosis not present

## 2016-02-17 DIAGNOSIS — I10 Essential (primary) hypertension: Secondary | ICD-10-CM | POA: Diagnosis not present

## 2016-02-17 DIAGNOSIS — Z139 Encounter for screening, unspecified: Secondary | ICD-10-CM | POA: Diagnosis not present

## 2016-05-11 DIAGNOSIS — R5383 Other fatigue: Secondary | ICD-10-CM | POA: Diagnosis not present

## 2016-05-11 DIAGNOSIS — R609 Edema, unspecified: Secondary | ICD-10-CM | POA: Diagnosis not present

## 2016-05-11 DIAGNOSIS — M810 Age-related osteoporosis without current pathological fracture: Secondary | ICD-10-CM | POA: Diagnosis not present

## 2016-05-11 DIAGNOSIS — E7800 Pure hypercholesterolemia, unspecified: Secondary | ICD-10-CM | POA: Diagnosis not present

## 2016-05-11 DIAGNOSIS — I10 Essential (primary) hypertension: Secondary | ICD-10-CM | POA: Diagnosis not present

## 2016-09-10 DIAGNOSIS — Z6821 Body mass index (BMI) 21.0-21.9, adult: Secondary | ICD-10-CM | POA: Diagnosis not present

## 2016-09-10 DIAGNOSIS — E7800 Pure hypercholesterolemia, unspecified: Secondary | ICD-10-CM | POA: Diagnosis not present

## 2016-09-10 DIAGNOSIS — R609 Edema, unspecified: Secondary | ICD-10-CM | POA: Diagnosis not present

## 2016-09-10 DIAGNOSIS — Z8673 Personal history of transient ischemic attack (TIA), and cerebral infarction without residual deficits: Secondary | ICD-10-CM | POA: Diagnosis not present

## 2016-09-10 DIAGNOSIS — I10 Essential (primary) hypertension: Secondary | ICD-10-CM | POA: Diagnosis not present

## 2016-09-10 DIAGNOSIS — M810 Age-related osteoporosis without current pathological fracture: Secondary | ICD-10-CM | POA: Diagnosis not present

## 2016-09-10 DIAGNOSIS — Z9181 History of falling: Secondary | ICD-10-CM | POA: Diagnosis not present

## 2016-10-03 ENCOUNTER — Emergency Department (HOSPITAL_COMMUNITY)
Admission: EM | Admit: 2016-10-03 | Discharge: 2016-10-03 | Disposition: A | Discharge status: Home / Self Care | Payer: PPO | Attending: Emergency Medicine | Admitting: Emergency Medicine

## 2016-10-03 ENCOUNTER — Encounter (HOSPITAL_COMMUNITY): Payer: Self-pay

## 2016-10-03 DIAGNOSIS — B029 Zoster without complications: Secondary | ICD-10-CM

## 2016-10-03 DIAGNOSIS — Z79899 Other long term (current) drug therapy: Secondary | ICD-10-CM | POA: Diagnosis not present

## 2016-10-03 DIAGNOSIS — I10 Essential (primary) hypertension: Secondary | ICD-10-CM | POA: Diagnosis not present

## 2016-10-03 DIAGNOSIS — Z7982 Long term (current) use of aspirin: Secondary | ICD-10-CM | POA: Diagnosis not present

## 2016-10-03 DIAGNOSIS — Z8673 Personal history of transient ischemic attack (TIA), and cerebral infarction without residual deficits: Secondary | ICD-10-CM | POA: Insufficient documentation

## 2016-10-03 DIAGNOSIS — Z96641 Presence of right artificial hip joint: Secondary | ICD-10-CM | POA: Insufficient documentation

## 2016-10-03 MED ORDER — VALACYCLOVIR HCL 1 G PO TABS: 1000.0000 mg | ORAL_TABLET | Freq: Three times a day (TID) | ORAL | 0 refills | Status: DC

## 2016-10-03 MED ORDER — VALACYCLOVIR HCL 500 MG PO TABS
1000.0000 mg | ORAL_TABLET | Freq: Every day | ORAL | Status: DC
  Administered 2016-10-03: 1000 mg via ORAL
  Filled 2016-10-03: qty 2

## 2016-10-03 MED ORDER — PREDNISONE 20 MG PO TABS: 40.0000 mg | ORAL_TABLET | Freq: Every day | ORAL | 0 refills | Status: DC

## 2016-10-03 MED ORDER — PREDNISONE 20 MG PO TABS
60.0000 mg | ORAL_TABLET | Freq: Once | ORAL | Status: AC
  Administered 2016-10-03: 60 mg via ORAL
  Filled 2016-10-03: qty 3

## 2016-10-03 NOTE — ED Provider Notes (Signed)
[facility]-EMERGENCY DEPT Provider Note   CSN: [id number] Arrival date & time: 10/03/16  1200     History   Chief Complaint Chief Complaint  Patient presents with  . possible shingles    HPI [name] Fry is a 82 y.o. female who presents with a rash. PMH significant for hx of TIA/CVA, HLD, HTN, allergies. She has a URI last week. Has not had Zostavax. The rash presented ~4 days ago. It is located in the lower back and extends to the right side and right lower abdomen. It was initially painful however it not painful currently. She has never had shingles before.  HPI  Past Medical History:  Diagnosis Date  . High cholesterol   . Hypertension   . Stroke [facility](HCC)     Patient Active Problem List   Diagnosis Date Noted  . Chronic anemia 12/23/2014  . Leukocytosis 12/23/2014  . History of pleural effusion 12/23/2014  . Aphasia 12/22/2014  . TIA (transient ischemic attack) 12/22/2014  . History of CVA (cerebrovascular accident) without residual deficits 12/22/2014  . Vitamin D deficiency 12/22/2014  . Allergic rhinitis 12/22/2014  . Osteoporosis 12/22/2014  . Protein-calorie malnutrition, severe (HCC) 07/11/2014  . Femoral neck fracture (HCC) 07/10/2014  . HTN (hypertension) 07/10/2014  . HLD (hyperlipidemia) 07/10/2014  . Closed right hip fracture (HCC) 07/10/2014    Past Surgical History:  Procedure Laterality Date  . HERNIA REPAIR    . HIP ARTHROPLASTY Right 07/10/2014   Procedure: ARTHROPLASTY BIPOLAR HIP;  Surgeon: Cheral AlmasNaiping Michael Xu, MD;  [facility];  Service: Orthopedics;  Laterality: Right;    OB History    No data available       Home Medications    Prior to Admission medications   Medication Sig Start Date End Date Taking? Authorizing Provider  acetaminophen (TYLENOL) 325 MG tablet Take 650 mg by mouth every 6 (six) hours as needed for mild pain or moderate pain.    Historical Provider, MD  aspirin 325 MG tablet Take 1 tablet (325 mg total) by mouth  daily. 12/23/14   Donavan FoilEverett J Moding, MD  calcium-vitamin D (OSCAL WITH D) 500-200 MG-UNIT per tablet Take 1 tablet by mouth daily with breakfast. 07/10/14   Tarry KosNaiping M Xu, MD  feeding supplement, ENSURE COMPLETE, (ENSURE COMPLETE) LIQD Take 237 mLs by mouth 2 (two) times daily between meals. Patient not taking: Reported on 12/26/2014 07/12/14   Richarda OverlieNayana Abrol, MD  furosemide (LASIX) 20 MG tablet Take 20 mg by mouth 2 (two) times daily.     Historical Provider, MD  hydrALAZINE (APRESOLINE) 50 MG tablet Take 1.5 tablets (75 mg total) by mouth 3 (three) times daily. 12/26/14   Ames DuraStephen Balleh, MD  HYDROcodone-acetaminophen (NORCO) 5-325 MG per tablet Take 1-2 tablets by mouth every 6 (six) hours as needed. Patient taking differently: Take 1-2 tablets by mouth every 6 (six) hours as needed for moderate pain.  07/10/14   Tarry KosNaiping M Xu, MD  loratadine (CLARITIN) 10 MG tablet Take 10 mg by mouth daily.    Historical Provider, MD  magnesium hydroxide (MILK OF MAGNESIA) 400 MG/5ML suspension Take 30 mLs by mouth daily as needed for mild constipation.    Historical Provider, MD  metoprolol (TOPROL-XL) 200 MG 24 hr tablet Take 200 mg by mouth daily.    Historical Provider, MD  ondansetron (ZOFRAN) 4 MG tablet Take 1 tablet (4 mg total) by mouth every 6 (six) hours. 12/26/14   Ames DuraStephen Balleh, MD  pantoprazole (PROTONIX) 20 MG tablet Take  1 tablet (20 mg total) by mouth daily. 12/23/14   Donavan FoilEverett J Moding, MD  polyethylene glycol (MIRALAX / GLYCOLAX) packet Take 17 g by mouth daily as needed for mild constipation. 07/12/14   Richarda OverlieNayana Abrol, MD  potassium chloride SA (K-DUR,KLOR-CON) 20 MEQ tablet Take 20 mEq by mouth daily.     Historical Provider, MD  pravastatin (PRAVACHOL) 40 MG tablet Take 40 mg by mouth daily.    Historical Provider, MD  promethazine (PHENERGAN) 25 MG tablet Take 1 tablet (25 mg total) by mouth every 6 (six) hours as needed for nausea. 07/10/14   Naiping Donnelly StagerM Xu, MD  senna-docusate (SENOKOT S) 8.6-50 MG per tablet  Take 1 tablet by mouth at bedtime as needed. Patient not taking: Reported on 12/22/2014 07/10/14   Tarry KosNaiping M Xu, MD  sorbitol 70 % SOLN Take 30 mLs by mouth daily as needed for moderate constipation. Patient not taking: Reported on 12/22/2014 07/12/14   Richarda OverlieNayana Abrol, MD    Family History Family History  Problem Relation Age of Onset  . Dementia Other   . CAD Neg Hx     Social History Social History  Substance Use Topics  . Smoking status: Never Smoker  . Smokeless tobacco: Not on file  . Alcohol use No     Allergies   Patient has no known allergies.   Review of Systems Review of Systems  Constitutional: Negative for chills and fever.  Skin: Positive for rash.  Allergic/Immunologic: Negative for immunocompromised state.  All other systems reviewed and are negative.    Physical Exam Updated Vital Signs BP (!) 209/98 (BP Location: Left Arm)   Pulse 64   Temp 97.6 F (36.4 C)   Resp 18   SpO2 98%   Physical Exam  Constitutional: She is oriented to person, place, and time. She appears well-developed and well-nourished. No distress.  HENT:  Head: Normocephalic and atraumatic.  Eyes: Conjunctivae are normal. Pupils are equal, round, and reactive to light. Right eye exhibits no discharge. Left eye exhibits no discharge. No scleral icterus.  Neck: Normal range of motion.  Cardiovascular: Normal rate.   Pulmonary/Chest: Effort normal. No respiratory distress.  Abdominal: She exhibits no distension.  Neurological: She is alert and oriented to person, place, and time.  Skin: Skin is warm and dry. Rash noted.  Rash with grouped vesicles on an erythematous base in various stages of healing that extend from the right lower back to the right abdomen. No tenderness to palpation.  Psychiatric: She has a normal mood and affect. Her behavior is normal.  Nursing note and vitals reviewed.    ED Treatments / Results  Labs (all labs ordered are listed, but only abnormal results are  displayed) Labs Reviewed - No data to display  EKG  EKG Interpretation None       Radiology No results found.  Procedures Procedures (including critical care time)  Medications Ordered in ED Medications  valACYclovir (VALTREX) tablet 1,000 mg (1,000 mg Oral Given 10/03/16 1332)  predniSONE (DELTASONE) tablet 60 mg (60 mg Oral Given 10/03/16 1332)     Initial Impression / Assessment and Plan / ED Course  I have reviewed the triage vital signs and the nursing notes.  Pertinent labs & imaging results that were available during my care of the patient were reviewed by me and considered in my medical decision making (see chart for details).  Clinical Course    82 year old female with shingles. She is hypertensive - has hx  of HTN. No evidence of hypertensive emergency. Ordered dose of Valtrex and prednisone in the ED with rx for the same. Advised close PCP follow up. Shared visit with Dr. Deretha Emory. Family verbalized understanding.  Final Clinical Impressions(s) / ED Diagnoses   Final diagnoses:  Herpes zoster without complication    New Prescriptions Discharge Medication List as of 10/03/2016  1:40 PM    START taking these medications   Details  predniSONE (DELTASONE) 20 MG tablet Take 2 tablets (40 mg total) by mouth daily., Starting Sun 10/03/2016, Print    valACYclovir (VALTREX) 1000 MG tablet Take 1 tablet (1,000 mg total) by mouth 3 (three) times daily., Starting Sun 10/03/2016, Print         Bethel Born, PA-C 10/03/16 1549    Vanetta Mulders, MD 10/04/16 1659

## 2016-10-03 NOTE — ED Notes (Signed)
Pt given apple sauce and water to take pills.

## 2016-10-03 NOTE — ED Triage Notes (Signed)
Patient here with rash to back for several days. Pain initially but none now, possible shingles, NAD. No drainage

## 2016-10-03 NOTE — ED Provider Notes (Signed)
Medical screening examination/treatment/procedure(s) were conducted as a shared visit with non-physician practitioner(s) and myself.  I personally evaluated the patient during the encounter.   EKG Interpretation None       Symptoms consistent with shingles. T10 dermatome on the right side. Vesicles in stages some are scabbed over already.  Rashes been present at least for 6 days according to family members. Patient did have chickenpox at a younger age peers never had shingles before. Patient nontoxic no acute distress. No fevers oxygen saturation 98% on room air. Heart regular lungs clear bilaterally. Abdomen flat normal bowel sounds.   Vanetta MuldersScott Fredis Malkiewicz, MD 10/03/16 1255

## 2016-10-03 NOTE — Discharge Instructions (Signed)
Take Valtrex three times daily Take Prednisone once daily - you have received a dose today so don't need to take another dose until tomorrow

## 2016-10-08 ENCOUNTER — Encounter (HOSPITAL_COMMUNITY): Payer: Self-pay | Admitting: Neurology

## 2016-10-08 ENCOUNTER — Inpatient Hospital Stay (HOSPITAL_COMMUNITY)
Admission: EM | Admit: 2016-10-08 | Discharge: 2016-10-18 | DRG: 643 | Disposition: A | Discharge status: Skilled Nursing Facility | Payer: PPO | Attending: Family Medicine | Admitting: Family Medicine

## 2016-10-08 ENCOUNTER — Inpatient Hospital Stay (HOSPITAL_COMMUNITY): Payer: PPO

## 2016-10-08 ENCOUNTER — Emergency Department (HOSPITAL_COMMUNITY): Payer: PPO

## 2016-10-08 DIAGNOSIS — I700 Atherosclerosis of aorta: Secondary | ICD-10-CM | POA: Diagnosis present

## 2016-10-08 DIAGNOSIS — I517 Cardiomegaly: Secondary | ICD-10-CM | POA: Diagnosis not present

## 2016-10-08 DIAGNOSIS — M419 Scoliosis, unspecified: Secondary | ICD-10-CM | POA: Diagnosis present

## 2016-10-08 DIAGNOSIS — Z515 Encounter for palliative care: Secondary | ICD-10-CM

## 2016-10-08 DIAGNOSIS — Z7951 Long term (current) use of inhaled steroids: Secondary | ICD-10-CM

## 2016-10-08 DIAGNOSIS — G9340 Encephalopathy, unspecified: Secondary | ICD-10-CM | POA: Diagnosis not present

## 2016-10-08 DIAGNOSIS — Z66 Do not resuscitate: Secondary | ICD-10-CM | POA: Diagnosis not present

## 2016-10-08 DIAGNOSIS — I69898 Other sequelae of other cerebrovascular disease: Secondary | ICD-10-CM

## 2016-10-08 DIAGNOSIS — I4510 Unspecified right bundle-branch block: Secondary | ICD-10-CM | POA: Diagnosis present

## 2016-10-08 DIAGNOSIS — R0989 Other specified symptoms and signs involving the circulatory and respiratory systems: Secondary | ICD-10-CM

## 2016-10-08 DIAGNOSIS — Z8781 Personal history of (healed) traumatic fracture: Secondary | ICD-10-CM

## 2016-10-08 DIAGNOSIS — Z96641 Presence of right artificial hip joint: Secondary | ICD-10-CM | POA: Diagnosis not present

## 2016-10-08 DIAGNOSIS — B029 Zoster without complications: Secondary | ICD-10-CM | POA: Diagnosis not present

## 2016-10-08 DIAGNOSIS — E2749 Other adrenocortical insufficiency: Secondary | ICD-10-CM | POA: Diagnosis present

## 2016-10-08 DIAGNOSIS — E785 Hyperlipidemia, unspecified: Secondary | ICD-10-CM | POA: Diagnosis present

## 2016-10-08 DIAGNOSIS — M810 Age-related osteoporosis without current pathological fracture: Secondary | ICD-10-CM | POA: Diagnosis not present

## 2016-10-08 DIAGNOSIS — R531 Weakness: Secondary | ICD-10-CM | POA: Diagnosis not present

## 2016-10-08 DIAGNOSIS — D72829 Elevated white blood cell count, unspecified: Secondary | ICD-10-CM | POA: Diagnosis not present

## 2016-10-08 DIAGNOSIS — T380X5A Adverse effect of glucocorticoids and synthetic analogues, initial encounter: Secondary | ICD-10-CM | POA: Diagnosis not present

## 2016-10-08 DIAGNOSIS — K922 Gastrointestinal hemorrhage, unspecified: Secondary | ICD-10-CM | POA: Diagnosis not present

## 2016-10-08 DIAGNOSIS — E8770 Fluid overload, unspecified: Secondary | ICD-10-CM | POA: Diagnosis present

## 2016-10-08 DIAGNOSIS — R918 Other nonspecific abnormal finding of lung field: Secondary | ICD-10-CM | POA: Diagnosis not present

## 2016-10-08 DIAGNOSIS — Z681 Body mass index (BMI) 19 or less, adult: Secondary | ICD-10-CM | POA: Diagnosis not present

## 2016-10-08 DIAGNOSIS — I10 Essential (primary) hypertension: Secondary | ICD-10-CM | POA: Diagnosis not present

## 2016-10-08 DIAGNOSIS — Z7189 Other specified counseling: Secondary | ICD-10-CM | POA: Diagnosis not present

## 2016-10-08 DIAGNOSIS — E871 Hypo-osmolality and hyponatremia: Secondary | ICD-10-CM | POA: Diagnosis not present

## 2016-10-08 DIAGNOSIS — E222 Syndrome of inappropriate secretion of antidiuretic hormone: Secondary | ICD-10-CM | POA: Diagnosis not present

## 2016-10-08 DIAGNOSIS — E43 Unspecified severe protein-calorie malnutrition: Secondary | ICD-10-CM | POA: Diagnosis not present

## 2016-10-08 DIAGNOSIS — R109 Unspecified abdominal pain: Secondary | ICD-10-CM

## 2016-10-08 DIAGNOSIS — J90 Pleural effusion, not elsewhere classified: Secondary | ICD-10-CM | POA: Diagnosis not present

## 2016-10-08 DIAGNOSIS — M5136 Other intervertebral disc degeneration, lumbar region: Secondary | ICD-10-CM | POA: Diagnosis not present

## 2016-10-08 DIAGNOSIS — Z79899 Other long term (current) drug therapy: Secondary | ICD-10-CM

## 2016-10-08 DIAGNOSIS — M8580 Other specified disorders of bone density and structure, unspecified site: Secondary | ICD-10-CM | POA: Diagnosis present

## 2016-10-08 DIAGNOSIS — Z79891 Long term (current) use of opiate analgesic: Secondary | ICD-10-CM

## 2016-10-08 DIAGNOSIS — Z9181 History of falling: Secondary | ICD-10-CM

## 2016-10-08 DIAGNOSIS — M6281 Muscle weakness (generalized): Secondary | ICD-10-CM | POA: Diagnosis not present

## 2016-10-08 DIAGNOSIS — Z7982 Long term (current) use of aspirin: Secondary | ICD-10-CM

## 2016-10-08 HISTORY — DX: Unspecified osteoarthritis, unspecified site: M19.90

## 2016-10-08 LAB — OSMOLALITY: Osmolality: 242 mOsm/kg — CL (ref 275–295)

## 2016-10-08 LAB — CBC WITH DIFFERENTIAL/PLATELET
Basophils Absolute: 0 10*3/uL (ref 0.0–0.1)
Basophils Relative: 0 %
Eosinophils Absolute: 0 10*3/uL (ref 0.0–0.7)
Eosinophils Relative: 0 %
HCT: 41.1 % (ref 36.0–46.0)
Hemoglobin: 14.9 g/dL (ref 12.0–15.0)
Lymphocytes Relative: 18 %
Lymphs Abs: 3.3 10*3/uL (ref 0.7–4.0)
MCH: 29 pg (ref 26.0–34.0)
MCHC: 36.3 g/dL — ABNORMAL HIGH (ref 30.0–36.0)
MCV: 80.1 fL (ref 78.0–100.0)
Monocytes Absolute: 2 10*3/uL — ABNORMAL HIGH (ref 0.1–1.0)
Monocytes Relative: 11 %
Neutro Abs: 13.1 10*3/uL — ABNORMAL HIGH (ref 1.7–7.7)
Neutrophils Relative %: 71 %
Platelets: 334 10*3/uL (ref 150–400)
RBC: 5.13 MIL/uL — ABNORMAL HIGH (ref 3.87–5.11)
RDW: 12.7 % (ref 11.5–15.5)
WBC: 18.4 10*3/uL — ABNORMAL HIGH (ref 4.0–10.5)

## 2016-10-08 LAB — CORTISOL: Cortisol, Plasma: 5.9 ug/dL

## 2016-10-08 LAB — URINALYSIS, ROUTINE W REFLEX MICROSCOPIC
Bilirubin Urine: NEGATIVE
Glucose, UA: NEGATIVE mg/dL
Hgb urine dipstick: NEGATIVE
Ketones, ur: NEGATIVE mg/dL
Nitrite: NEGATIVE
Protein, ur: NEGATIVE mg/dL
Specific Gravity, Urine: 1.016 (ref 1.005–1.030)
pH: 7 (ref 5.0–8.0)

## 2016-10-08 LAB — URINE MICROSCOPIC-ADD ON: Bacteria, UA: NONE SEEN

## 2016-10-08 LAB — OSMOLALITY, URINE: Osmolality, Ur: 491 mOsm/kg (ref 300–900)

## 2016-10-08 LAB — BASIC METABOLIC PANEL
ANION GAP: 9 (ref 5–15)
Anion gap: 10 (ref 5–15)
BUN: 18 mg/dL (ref 6–20)
BUN: 18 mg/dL (ref 6–20)
CHLORIDE: 78 mmol/L — AB (ref 101–111)
CO2: 27 mmol/L (ref 22–32)
CO2: 24 mmol/L (ref 22–32)
Calcium: 8.9 mg/dL (ref 8.9–10.3)
Calcium: 8.4 mg/dL — ABNORMAL LOW (ref 8.9–10.3)
Chloride: 76 mmol/L — ABNORMAL LOW (ref 101–111)
Creatinine, Ser: 0.98 mg/dL (ref 0.44–1.00)
Creatinine, Ser: 0.92 mg/dL (ref 0.44–1.00)
GFR calc Af Amer: 55 mL/min — ABNORMAL LOW (ref >60)
GFR calc non Af Amer: 48 mL/min — ABNORMAL LOW (ref >60)
GFR calc non Af Amer: 52 mL/min — ABNORMAL LOW (ref >60)
GFR, EST AFRICAN AMERICAN: 60 mL/min — AB (ref >60)
Glucose, Bld: 108 mg/dL — ABNORMAL HIGH (ref 65–99)
Glucose, Bld: 92 mg/dL (ref 65–99)
POTASSIUM: 4.8 mmol/L (ref 3.5–5.1)
Potassium: 3.9 mmol/L (ref 3.5–5.1)
SODIUM: 111 mmol/L — AB (ref 135–145)
Sodium: 113 mmol/L — CL (ref 135–145)

## 2016-10-08 LAB — TSH: TSH: 1.52 u[IU]/mL (ref 0.350–4.500)

## 2016-10-08 LAB — CREATININE, URINE, RANDOM: Creatinine, Urine: 63.99 mg/dL

## 2016-10-08 LAB — SODIUM, URINE, RANDOM: Sodium, Ur: 58 mmol/L

## 2016-10-08 LAB — PHOSPHORUS: PHOSPHORUS: 1.7 mg/dL — AB (ref 2.5–4.6)

## 2016-10-08 LAB — MAGNESIUM: MAGNESIUM: 1.4 mg/dL — AB (ref 1.7–2.4)

## 2016-10-08 LAB — TROPONIN I: Troponin I: <0.03 ng/mL (ref <0.03)

## 2016-10-08 MED ORDER — SODIUM CHLORIDE 1 G PO TABS
0.5000 g | ORAL_TABLET | Freq: Once | ORAL | Status: DC
  Filled 2016-10-08: qty 0.5

## 2016-10-08 MED ORDER — ACETAMINOPHEN 325 MG PO TABS
650.0000 mg | ORAL_TABLET | Freq: Four times a day (QID) | ORAL | Status: DC | PRN
  Administered 2016-10-10 – 2016-10-15 (×3): 650 mg via ORAL
  Filled 2016-10-08 (×3): qty 2

## 2016-10-08 MED ORDER — HYDRALAZINE HCL 20 MG/ML IJ SOLN: 5.0000 mg | INTRAMUSCULAR | Status: DC | PRN

## 2016-10-08 MED ORDER — SODIUM CHLORIDE 0.9 % IV SOLN
250.0000 mL | INTRAVENOUS | Status: DC | PRN
Start: 2016-10-08 — End: 2016-10-15

## 2016-10-08 MED ORDER — METOPROLOL SUCCINATE ER 100 MG PO TB24: 200.0000 mg | ORAL_TABLET | Freq: Every day | ORAL | Status: DC

## 2016-10-08 MED ORDER — ENOXAPARIN SODIUM 30 MG/0.3ML ~~LOC~~ SOLN
30.0000 mg | SUBCUTANEOUS | Status: DC
  Administered 2016-10-08 – 2016-10-10 (×3): 30 mg via SUBCUTANEOUS
  Filled 2016-10-08 (×2): qty 0.3

## 2016-10-08 MED ORDER — SODIUM CHLORIDE 0.9% FLUSH
3.0000 mL | Freq: Two times a day (BID) | INTRAVENOUS | Status: DC
  Administered 2016-10-08 – 2016-10-14 (×12): 3 mL via INTRAVENOUS

## 2016-10-08 MED ORDER — FUROSEMIDE 10 MG/ML IJ SOLN
20.0000 mg | Freq: Once | INTRAMUSCULAR | Status: AC
  Administered 2016-10-08: 20 mg via INTRAVENOUS
  Filled 2016-10-08: qty 2

## 2016-10-08 MED ORDER — HYDRALAZINE HCL 50 MG PO TABS
75.0000 mg | ORAL_TABLET | Freq: Three times a day (TID) | ORAL | Status: DC
  Administered 2016-10-08 – 2016-10-18 (×25): 75 mg via ORAL
  Filled 2016-10-08 (×26): qty 1

## 2016-10-08 MED ORDER — SODIUM PHOSPHATES 45 MMOLE/15ML IV SOLN
10.0000 mmol | Freq: Once | INTRAVENOUS | Status: AC
  Administered 2016-10-08: 10 mmol via INTRAVENOUS
  Filled 2016-10-08: qty 3.33

## 2016-10-08 MED ORDER — CALCIUM CARBONATE-VITAMIN D 500-200 MG-UNIT PO TABS
1.0000 | ORAL_TABLET | Freq: Three times a day (TID) | ORAL | Status: DC
  Administered 2016-10-09 – 2016-10-18 (×21): 1 via ORAL
  Filled 2016-10-08 (×22): qty 1

## 2016-10-08 MED ORDER — SODIUM CHLORIDE 0.9% FLUSH
3.0000 mL | INTRAVENOUS | Status: DC | PRN
Start: 2016-10-08 — End: 2016-10-15

## 2016-10-08 MED ORDER — ASPIRIN EC 81 MG PO TBEC
162.0000 mg | DELAYED_RELEASE_TABLET | Freq: Two times a day (BID) | ORAL | Status: DC
  Administered 2016-10-08 – 2016-10-10 (×5): 162 mg via ORAL
  Filled 2016-10-08 (×5): qty 2

## 2016-10-08 MED ORDER — VALACYCLOVIR HCL 500 MG PO TABS
1000.0000 mg | ORAL_TABLET | Freq: Three times a day (TID) | ORAL | Status: DC
  Administered 2016-10-08 – 2016-10-10 (×4): 1000 mg via ORAL
  Filled 2016-10-08 (×4): qty 2

## 2016-10-08 MED ORDER — ENSURE ENLIVE PO LIQD
237.0000 mL | Freq: Two times a day (BID) | ORAL | Status: DC
  Administered 2016-10-09 – 2016-10-18 (×11): 237 mL via ORAL
  Filled 2016-10-08: qty 237

## 2016-10-08 MED ORDER — SODIUM CHLORIDE 0.9% FLUSH
3.0000 mL | Freq: Two times a day (BID) | INTRAVENOUS | Status: DC
  Administered 2016-10-08 – 2016-10-18 (×15): 3 mL via INTRAVENOUS

## 2016-10-08 MED ORDER — MAGNESIUM SULFATE 2 GM/50ML IV SOLN
2.0000 g | Freq: Once | INTRAVENOUS | Status: AC
  Administered 2016-10-08: 2 g via INTRAVENOUS
  Filled 2016-10-08: qty 50

## 2016-10-08 MED ORDER — PRAVASTATIN SODIUM 40 MG PO TABS
40.0000 mg | ORAL_TABLET | Freq: Every day | ORAL | Status: DC
  Administered 2016-10-09 – 2016-10-18 (×8): 40 mg via ORAL
  Filled 2016-10-08 (×9): qty 1

## 2016-10-08 NOTE — ED Notes (Signed)
Patient transported to X-ray 

## 2016-10-08 NOTE — Progress Notes (Signed)
Pt admitted from ed via stretcher to rm 5w34.  Heart monitor applied and pt and family updated on plan of care.  [name] skin is intact. Pt instructed on callbell use and bed alarm set.

## 2016-10-08 NOTE — Progress Notes (Signed)
Bedside swallow test performed. Prior to test, pt's lungs were clear with diminished bases and the same after the test.  Pt tolerated a dry cracker without any signs of coughing or choking.  Pt was able to drinks sips of water with and without a straw without any coughing. Pt does need extra time to swallow but does so without difficulty. Diet was regular pta.  Dr Mosetta PuttFeng was in room and informed of results.

## 2016-10-08 NOTE — ED Provider Notes (Signed)
[facility]-EMERGENCY DEPT Provider Note   CSN: [id number] Arrival date & time: 10/08/16  8295   By signing my name below, I, [name], attest that this documentation has been prepared under the direction and in the presence of [name], MD . Electronically Signed: [name], Scribe. 10/08/2016. 1:22 PM.   History   Chief Complaint Chief Complaint  Patient presents with  . Weakness    The history is provided by the patient, a relative and medical records. No language interpreter was used.     HPI Comments:  [name] Fry is a 82 y.o. female with a history of HTN, and TIA who presents to the Emergency Department complaining of gradual onset BLE weakness since yesterday, per pt's daughter. Daughter also reports 1 episode of vomiting but denies change in appetite. Family also reports recent change in ADLs,  decreased from baseline activity. Pt was diagnosed with shingles on 10/03/16 and started on valtrex and prednisone. Pt notes shingles rash on her back is improving and denies nausea at this time, however, pt is somewhat of a poor historian. No alleviating factors noted.    Past Medical History:  Diagnosis Date  . High cholesterol   . Hypertension   . Stroke [facility])     Patient Active Problem List   Diagnosis Date Noted  . Chronic anemia 12/23/2014  . Leukocytosis 12/23/2014  . History of pleural effusion 12/23/2014  . Aphasia 12/22/2014  . TIA (transient ischemic attack) 12/22/2014  . History of CVA (cerebrovascular accident) without residual deficits 12/22/2014  . Vitamin D deficiency 12/22/2014  . Allergic rhinitis 12/22/2014  . Osteoporosis 12/22/2014  . Protein-calorie malnutrition, severe (HCC) 07/11/2014  . Femoral neck fracture (HCC) 07/10/2014  . HTN (hypertension) 07/10/2014  . HLD (hyperlipidemia) 07/10/2014  . Closed right hip fracture (HCC) 07/10/2014    Past Surgical History:  Procedure Laterality Date  . HERNIA REPAIR    . HIP ARTHROPLASTY  Right 07/10/2014   Procedure: ARTHROPLASTY BIPOLAR HIP;  Surgeon: Cheral Almas, MD;  Location: [facility] OR;  Service: Orthopedics;  Laterality: Right;    OB History    No data available       Home Medications    Prior to Admission medications   Medication Sig Start Date End Date Taking? Authorizing Provider  alendronate (FOSAMAX) 70 MG tablet Take 70 mg by mouth every Wednesday. Take with a full glass of water on an empty stomach.   Yes Historical Provider, MD  aspirin EC 81 MG tablet Take 162 mg by mouth 2 (two) times daily.   Yes Historical Provider, MD  calcium-vitamin D (OSCAL WITH D) 500-200 MG-UNIT per tablet Take 1 tablet by mouth daily with breakfast. Patient taking differently: Take 1 tablet by mouth 3 (three) times daily.  07/10/14  Yes Naiping Donnelly Stager, MD  hydrALAZINE (APRESOLINE) 50 MG tablet Take 1.5 tablets (75 mg total) by mouth 3 (three) times daily. 12/26/14  Yes Ames Dura, MD  loratadine (CLARITIN) 10 MG tablet Take 10 mg by mouth daily.   Yes Historical Provider, MD  magnesium hydroxide (MILK OF MAGNESIA) 400 MG/5ML suspension Take 30 mLs by mouth daily as needed for mild constipation.   Yes Historical Provider, MD  metoprolol (TOPROL-XL) 200 MG 24 hr tablet Take 200 mg by mouth daily.   Yes Historical Provider, MD  Pramoxine-Camphor-Zinc Acetate (ANTI ITCH EX) Apply 1 application topically as needed (for itching).   Yes Historical Provider, MD  pravastatin (PRAVACHOL) 40 MG tablet Take 40 mg by  mouth daily.   Yes Historical Provider, MD  spironolactone (ALDACTONE) 50 MG tablet Take 50 mg by mouth daily.   Yes Historical Provider, MD  valACYclovir (VALTREX) 1000 MG tablet Take 1 tablet (1,000 mg total) by mouth 3 (three) times daily. 10/03/16  Yes Bethel Born, PA-C  acetaminophen (TYLENOL) 325 MG tablet Take 650 mg by mouth every 6 (six) hours as needed for mild pain or moderate pain.    Historical Provider, MD  aspirin 325 MG tablet Take 1 tablet (325 mg total) by  mouth daily. Patient not taking: Reported on 10/08/2016 12/23/14   Adrian Blackwater Moding, MD  feeding supplement, ENSURE COMPLETE, (ENSURE COMPLETE) LIQD Take 237 mLs by mouth 2 (two) times daily between meals. Patient not taking: Reported on 10/08/2016 07/12/14   Richarda Overlie, MD  HYDROcodone-acetaminophen (NORCO) 5-325 MG per tablet Take 1-2 tablets by mouth every 6 (six) hours as needed. Patient not taking: Reported on 10/08/2016 07/10/14   Tarry Kos, MD  ondansetron (ZOFRAN) 4 MG tablet Take 1 tablet (4 mg total) by mouth every 6 (six) hours. Patient not taking: Reported on 10/08/2016 12/26/14   Ames Dura, MD  pantoprazole (PROTONIX) 20 MG tablet Take 1 tablet (20 mg total) by mouth daily. Patient not taking: Reported on 10/08/2016 12/23/14   Adrian Blackwater Moding, MD  polyethylene glycol (MIRALAX / Ethelene Hal) packet Take 17 g by mouth daily as needed for mild constipation. Patient not taking: Reported on 10/08/2016 07/12/14   Richarda Overlie, MD  predniSONE (DELTASONE) 20 MG tablet Take 2 tablets (40 mg total) by mouth daily. Patient not taking: Reported on 10/08/2016 10/03/16   Bethel Born, PA-C  promethazine (PHENERGAN) 25 MG tablet Take 1 tablet (25 mg total) by mouth every 6 (six) hours as needed for nausea. Patient not taking: Reported on 10/08/2016 07/10/14   Tarry Kos, MD  senna-docusate (SENOKOT S) 8.6-50 MG per tablet Take 1 tablet by mouth at bedtime as needed. Patient not taking: Reported on 10/08/2016 07/10/14   Tarry Kos, MD  sorbitol 70 % SOLN Take 30 mLs by mouth daily as needed for moderate constipation. Patient not taking: Reported on 10/08/2016 07/12/14   Richarda Overlie, MD    Family History Family History  Problem Relation Age of Onset  . Dementia Other   . CAD Neg Hx     Social History Social History  Substance Use Topics  . Smoking status: Never Smoker  . Smokeless tobacco: Not on file  . Alcohol use No     Allergies   Patient has no known  allergies.   Review of Systems Review of Systems  Constitutional: Positive for activity change. Negative for appetite change.  Gastrointestinal: Positive for vomiting.  Skin: Positive for rash.  Neurological: Positive for weakness.  All other systems reviewed and are negative.   Physical Exam Updated Vital Signs BP 191/94   Pulse 63   Temp 97.8 F (36.6 C) (Oral)   Resp 13   SpO2 100%   Physical Exam  Constitutional: She is oriented to person, place, and time. She appears well-developed and well-nourished. No distress.  Pt appears tired but nontoxic  HENT:  Head: Normocephalic and atraumatic.  Eyes: EOM are normal.  Neck: Normal range of motion.  Cardiovascular: Normal rate, regular rhythm and normal heart sounds.   Pulmonary/Chest: Effort normal and breath sounds normal.  Abdominal: Soft. She exhibits no distension. There is no tenderness.  Musculoskeletal: Normal range of motion.  Symmetric pitting BLE  edema   Neurological: She is alert and oriented to person, place, and time.   Speech is slow but deliberate Follows commands  Questionable mild LLE weakness   Skin: Skin is warm and dry.  Scab lesions right T10 dermatone   Psychiatric: She has a normal mood and affect. Judgment normal.  Nursing note and vitals reviewed.    ED Treatments / Results  DIAGNOSTIC STUDIES:  Oxygen Saturation is 100% on RA, normal by my interpretation.    COORDINATION OF CARE:  10:35 AM Discussed treatment plan with pt and family at bedside.   Labs (all labs ordered are listed, but only abnormal results are displayed) Labs Reviewed  CBC WITH DIFFERENTIAL/PLATELET - Abnormal; Notable for the following:       Result Value   WBC 18.4 (*)    RBC 5.13 (*)    MCHC 36.3 (*)    Neutro Abs 13.1 (*)    Monocytes Absolute 2.0 (*)    All other components within normal limits  BASIC METABOLIC PANEL - Abnormal; Notable for the following:    Sodium 113 (*)    Chloride 76 (*)    Glucose,  Bld 108 (*)    GFR calc non Af Amer 48 (*)    GFR calc Af Amer 55 (*)    All other components within normal limits  URINALYSIS, ROUTINE W REFLEX MICROSCOPIC (NOT AT [facility]) - Abnormal; Notable for the following:    Leukocytes, UA SMALL (*)    All other components within normal limits  URINE MICROSCOPIC-ADD ON - Abnormal; Notable for the following:    Squamous Epithelial / LPF 0-5 (*)    All other components within normal limits  OSMOLALITY - Abnormal; Notable for the following:    Osmolality 242 (*)    All other components within normal limits  CBC - Abnormal; Notable for the following:    WBC 16.5 (*)    All other components within normal limits  BASIC METABOLIC PANEL - Abnormal; Notable for the following:    Sodium 111 (*)    Chloride 78 (*)    Calcium 8.4 (*)    GFR calc non Af Amer 52 (*)    GFR calc Af Amer 60 (*)    All other components within normal limits  MAGNESIUM - Abnormal; Notable for the following:    Magnesium 1.4 (*)    All other components within normal limits  PHOSPHORUS - Abnormal; Notable for the following:    Phosphorus 1.7 (*)    All other components within normal limits  TROPONIN I - Abnormal; Notable for the following:    Troponin I 0.03 (*)    All other components within normal limits  BASIC METABOLIC PANEL - Abnormal; Notable for the following:    Sodium 112 (*)    Potassium 3.2 (*)    Chloride 76 (*)    Glucose, Bld 107 (*)    Calcium 8.1 (*)    GFR calc non Af Amer 47 (*)    GFR calc Af Amer 55 (*)    All other components within normal limits  BASIC METABOLIC PANEL - Abnormal; Notable for the following:    Sodium 112 (*)    Chloride 74 (*)    Glucose, Bld 117 (*)    BUN 21 (*)    Creatinine, Ser 1.22 (*)    Calcium 8.4 (*)    GFR calc non Af Amer 37 (*)    GFR calc Af Amer 43 (*)  All other components within normal limits  BASIC METABOLIC PANEL - Abnormal; Notable for the following:    Sodium 113 (*)    Chloride 78 (*)    Glucose, Bld  101 (*)    BUN 27 (*)    Creatinine, Ser 1.14 (*)    Calcium 8.1 (*)    GFR calc non Af Amer 40 (*)    GFR calc Af Amer 46 (*)    All other components within normal limits  CBC WITH DIFFERENTIAL/PLATELET - Abnormal; Notable for the following:    WBC 20.8 (*)    MCHC 36.6 (*)    Neutro Abs 16.3 (*)    Monocytes Absolute 1.7 (*)    All other components within normal limits  BASIC METABOLIC PANEL - Abnormal; Notable for the following:    Sodium 116 (*)    Chloride 82 (*)    Glucose, Bld 115 (*)    BUN 38 (*)    Creatinine, Ser 1.15 (*)    Calcium 8.2 (*)    GFR calc non Af Amer 39 (*)    GFR calc Af Amer 46 (*)    All other components within normal limits  CBC - Abnormal; Notable for the following:    WBC 17.8 (*)    RBC 3.77 (*)    Hemoglobin 11.0 (*)    HCT 30.2 (*)    MCHC 36.4 (*)    All other components within normal limits  HEMOGLOBIN AND HEMATOCRIT, BLOOD - Abnormal; Notable for the following:    Hemoglobin 10.5 (*)    HCT 29.4 (*)    All other components within normal limits  HEMOGLOBIN AND HEMATOCRIT, BLOOD - Abnormal; Notable for the following:    Hemoglobin 9.8 (*)    HCT 27.8 (*)    All other components within normal limits  BASIC METABOLIC PANEL - Abnormal; Notable for the following:    Sodium 126 (*)    Potassium 3.3 (*)    Chloride 90 (*)    BUN 37 (*)    Creatinine, Ser 1.01 (*)    Calcium 8.1 (*)    GFR calc non Af Amer 46 (*)    GFR calc Af Amer 53 (*)    All other components within normal limits  CBC - Abnormal; Notable for the following:    WBC 17.5 (*)    RBC 3.27 (*)    Hemoglobin 9.3 (*)    HCT 26.8 (*)    All other components within normal limits  HEMOGLOBIN AND HEMATOCRIT, BLOOD - Abnormal; Notable for the following:    Hemoglobin 9.0 (*)    HCT 25.8 (*)    All other components within normal limits  HEMOGLOBIN AND HEMATOCRIT, BLOOD - Abnormal; Notable for the following:    Hemoglobin 9.1 (*)    HCT 25.7 (*)    All other components  within normal limits  ALBUMIN - Abnormal; Notable for the following:    Albumin 2.2 (*)    All other components within normal limits  PROTIME-INR - Abnormal; Notable for the following:    Prothrombin Time 18.1 (*)    All other components within normal limits  CBC - Abnormal; Notable for the following:    RBC 3.29 (*)    Hemoglobin 9.5 (*)    HCT 27.6 (*)    All other components within normal limits  BASIC METABOLIC PANEL - Abnormal; Notable for the following:    Sodium 132 (*)    Potassium 3.2 (*)  Chloride 96 (*)    Calcium 8.2 (*)    GFR calc non Af Amer 58 (*)    All other components within normal limits  CBC - Abnormal; Notable for the following:    WBC 11.1 (*)    RBC 2.75 (*)    Hemoglobin 8.1 (*)    HCT 24.0 (*)    All other components within normal limits  BASIC METABOLIC PANEL - Abnormal; Notable for the following:    Sodium 134 (*)    Chloride 98 (*)    Calcium 8.1 (*)    All other components within normal limits  MRSA PCR SCREENING  TROPONIN I  TSH  CORTISOL  SODIUM, URINE, RANDOM  CREATININE, URINE, RANDOM  OSMOLALITY, URINE  UREA NITROGEN, URINE  TROPONIN I  MAGNESIUM  PHOSPHORUS  SAVE SMEAR  LACTIC ACID, PLASMA  PATHOLOGIST SMEAR REVIEW    EKG  EKG Interpretation None       Radiology Ct Head Wo Contrast  Result Date: 10/08/2016 CLINICAL DATA:  Weakness for several days. Recent diagnosis of shingles EXAM: CT HEAD WITHOUT CONTRAST TECHNIQUE: Contiguous axial images were obtained from the base of the skull through the vertex without intravenous contrast. COMPARISON:  12/23/2014 FINDINGS: Brain: No evidence of acute infarction, hemorrhage, hydrocephalus, extra-axial collection or mass lesion/mass effect. There is mild diffuse low-attenuation within the subcortical and periventricular white matter compatible with chronic microvascular disease. Prominence of the sulci and ventricles noted compatible with brain atrophy. Vascular: No hyperdense vessel  or unexpected calcification. Skull: Normal. Negative for fracture or focal lesion. Sinuses/Orbits: No acute finding. Other: None. IMPRESSION: 1. No acute intracranial abnormality. 2. Chronic small vessel ischemic change and brain atrophy. Electronically Signed   By: Signa Kellaylor  Stroud M.D.   On: 10/08/2016 12:31    Procedures Procedures (including critical care time)  Medications Ordered in ED Medications - No data to display   Initial Impression / Assessment and Plan / ED Course  I have reviewed the triage vital signs and the nursing notes.  Pertinent labs & imaging results that were available during my care of the patient were reviewed by me and considered in my medical decision making (see chart for details).  Clinical Course     89yF with generalized weakness. Severe hyponatremia. Drowsy but not overtly confused. No reported seizure activity. Will fluid restrict for now in ED. Additional studies sent. Admit.   1:07 PM Discussed case with medical resident for admission.  Final Clinical Impressions(s) / ED Diagnoses   Final diagnoses:  Hyponatremia  Generalized weakness  Leukocytosis  Abdominal pain  Respiratory crackles at right lung base    New Prescriptions New Prescriptions   No medications on file   I personally preformed the services scribed in my presence. The recorded information has been reviewed is accurate. Raeford RazorStephen Tymara Saur, MD.     Raeford RazorStephen Dalinda Heidt, MD 10/20/16 [phone]

## 2016-10-08 NOTE — ED Triage Notes (Addendum)
Pt here with daughter, was treated for shingles over the weekend with prednisone and valtrex. Here c/o weakness for several days. Wondering if r/t prednisone. Feels weak all over and hard for her to walk. Pt is a x 4. In NAD. Shingles has crusted over to right lower back.

## 2016-10-08 NOTE — Progress Notes (Addendum)
Received call about patients low sodium to 111. Patient already received her IV Lasix. Plan for now is to hold off on central line for hypertonic saline. Patient is if anything minimally symptomatic and this is chronic for her. She is in the process of eating dinner. Encouraged a high salt meal and salty drinks. Will give PO sodium chloride. RN notified to let me know of any acute changes in patient. Will recheck BMP in AM. Plan formulated and discussed with attending.   Update- Patient with other electrolyte abnormalities including low Mg and low phos. Will replete those. Hold of on salt tab as she will receive some sodium with phos.    Caryl AdaJazma Ha Shannahan, DO PGY-3, [facility]

## 2016-10-08 NOTE — ED Notes (Signed)
Spoke with Dr. Mosetta PuttFeng to request PRN BP med, also reports pt does not need to be on isolation for shingles b/c crusted and healing.

## 2016-10-08 NOTE — Progress Notes (Signed)
CRITICAL VALUE ALERT  Critical value received:  Sodium 111  Date of notification:  10/08/2016  Time of notification:  1944  Critical value read back:Yes.    Nurse who received alert:  Tama HighKristen Ileah Falkenstein,RN  MD notified (1st page):  Family Medicine Teaching Services  Time of first page:  1952  Responding MD:  Phelps,DO  Time MD responded:  2012  Order for one time dose of sodium tablet and instruction to encourage patient to eat sodium rich foods. Phelps,DO cancelled order for sodium tablet. New order placed for IV sodium phosphate. Will continue to monitor and treat per MD orders.

## 2016-10-08 NOTE — H&P (Signed)
Family Medicine Teaching [facility] Admission History and Physical Service Pager: [phone]  Patient name: [name] Fry Medical record number: [id number] Date of birth: 20-Nov-1920 Age: 82 y.o. Gender: female  Primary Care Provider: [name],[name], MD , stephen cambell Consultants: None  Code Status: Full confirmed this admission  Chief Complaint: Weakness  Assessment and Plan: [name] Fry is a 82 y.o. female presenting with generalized weakness. PMH is significant for HTN, HLD, closed right hip fracture, TIA, hx CVA, chronic anemia.   Hyponatremia, mildly volume overloaded - Unclear etiology, in part may be secondary to drinking a lot ginger ale which does not have much sodium, with possible protein-calorie malnutrition contributing. "Tea and toast hyponatremia". May not be acute as there is some history of hyponatremia being worked up per family at bedside. Other etiologies considered include SIADH; will order CXR as this can be secondary to lung malignancy. Iatrogenic causes: patient is on spironolactone which is a diuretic and has been associated with fluid/electrolyte abnormalities such as hyponatremia. Hypothyroidism unlikely as TSH WNL. Hyperglycemia ruled out with glucose 108. Glucocorticoid deficiency is a possible cause, will order AM cortisol. Renal function good and not apparent cause for hyponatremia.  - Admit to FPTS attending Dr. Deirdre Priesthambliss - monitor on telemetry - SLIV. No free water. - 20 mg IV Lasix once - encourage salt with meals  - hold off on sodium replacement at this time. Try and get with diet - serial BMPs until asymptomatic and improved - CXR  #EKG changes. Appears chronic in nature without any significant changes rom 2015 EKG. Has ST depression in leads II, III, AVF, V4, V5. Also with T-wave inversion in II and III. RBBB. Initial troponin negative. Denies chest pain. No concern for ACS at this time. - trend troponins - repeat AM EKG  # Generalized  weakness, possible encephalopathy - likely secondary to hyponatremia. Encephalopathy more pronounced on initial evaluation than subsequent visit about 1 hour later, suggesting her mentation has improved/waxing and waning quality/may not be true encephalopathy. Symptomatic with generalized weakness (nonfocal) such that patient was unable to bear any weight or push herself up in her chair today, and also with altered mental status (oriented to self and place, not time, patient unable to name a close family member in the room, noted to be decreased from baseline by family). At baseline patient able to ambulate and perform ADLs. Weakness is a symptom of hyponatremia. Strength on admission 4/4. - monitor BMP for sodium - monitor neurological exam - Head CT negative for acute abnormality - PT/OT  # HTN, not controlled - BP elevated on admission to 212/91, came down to 175/89 in the ED.  At home, controlled with hydralazine 75 mg TID, spironolactone 50 mg qd, metoprolol 200 mg qd.  Patient took AM doses the day of admission but no other medications. - continue home hydralazine and metoprolol, hold spironlactone - lasix 20 mg IV given for hyponatremia as noted above - monitor BP  # Shingles. Recent diagnosis. Was seen in ED on 11/12. Sent home with 4 days of prednisone and Valtrex for 7 days. Lesions looked healed and scabbed over.  -completed steroids -continue valtrex to completion -monitor  #Leukocytois. WBC 18.4 on admission. Most likely 2/2 recent steriod use. No signs of infection currently. She is afebrile and vitals are stable. UA unremarkable.  -obtain CXR -monitor -repeat CBC in AM  #Osteopenia vs osteoporosis.  -continue home aldronate and oscal-D  # HLD - most recent lipid panel 12/2014 with results  cholesterol 125, triglycerides 84, HDL 52, LDL 56, VLDL 17.  Patient on pravastatin 40 mg at home. - continue pravastatin and ASA  # Hx CVA - some residual throat weakness reported however  no other focal neurologic deficits.  On admission, neurological exam was normal.  FEN/GI: NPO pending bedside swallow eval, then start normal diet (encourage salt), SLIV Prophylaxis: Lovenox  Disposition: Admit to FPTS, telemetry  History of Present Illness:  [name] Fry is a 82 y.o. female presenting with weakness that worsened throughout the day today.  Patient indicates she feels better if she is lying down but felt weak enough to need to come into the ED. After eating breakfast this morning the patient felt her legs were weak. She couldn't get herself out of the chair, and her daughter-in-law at bedside indicates the patient was sliding down in her chair, unable to push herself back up.  At baseline the patient is able to walk around with or without her walker.  She has been eating normally.  Of note the patient was diagnosed with shingles on Sunday and was given valtrex and prednisone.  She vomited x1 after taking prednisone, but subsequently tolerated the medication when the doses were divided with meals.  No falls or trauma. No vomiting since Sunday, no diarrhea or constipation.    Per family at bedside, the patient is less alert than usual with decreased strength from baseline. At baseline the patient is AAOx3, completes all of her ADLs but none of her iADLs.  She does have residual throat weakness s/p previous CVA, but no other focal weakness.    Review Of Systems: Per HPI with the following additions:   Review of Systems  Constitutional: Positive for malaise/fatigue. Negative for fever.  Gastrointestinal: Negative for nausea and vomiting.  Neurological: Positive for weakness.    Patient Active Problem List   Diagnosis Date Noted  . Hyponatremia 10/08/2016  . Chronic anemia 12/23/2014  . Leukocytosis 12/23/2014  . History of pleural effusion 12/23/2014  . Aphasia 12/22/2014  . TIA (transient ischemic attack) 12/22/2014  . History of CVA (cerebrovascular accident) without  residual deficits 12/22/2014  . Vitamin D deficiency 12/22/2014  . Allergic rhinitis 12/22/2014  . Osteoporosis 12/22/2014  . Protein-calorie malnutrition, severe (HCC) 07/11/2014  . Femoral neck fracture (HCC) 07/10/2014  . HTN (hypertension) 07/10/2014  . HLD (hyperlipidemia) 07/10/2014  . Closed right hip fracture (HCC) 07/10/2014    Past Medical History: Past Medical History:  Diagnosis Date  . High cholesterol   . Hypertension   . Stroke [facility])     Past Surgical History: Past Surgical History:  Procedure Laterality Date  . HERNIA REPAIR    . HIP ARTHROPLASTY Right 07/10/2014   Procedure: ARTHROPLASTY BIPOLAR HIP;  Surgeon: Cheral Almas, MD;  Location: [facility] OR;  Service: Orthopedics;  Laterality: Right;    Social History: Social History  Substance Use Topics  . Smoking status: Never Smoker  . Smokeless tobacco: Not on file  . Alcohol use No   Additional social history: Lives with daughter and niece Please also refer to relevant sections of EMR.  Family History: Family History  Problem Relation Age of Onset  . Dementia Other   . CAD Neg Hx     Allergies and Medications: No Known Allergies No current facility-administered medications on file prior to encounter.    Current Outpatient Prescriptions on File Prior to Encounter  Medication Sig Dispense Refill  . calcium-vitamin D (OSCAL WITH D) 500-200 MG-UNIT per tablet  Take 1 tablet by mouth daily with breakfast. (Patient taking differently: Take 1 tablet by mouth 3 (three) times daily. ) 42 tablet 0  . hydrALAZINE (APRESOLINE) 50 MG tablet Take 1.5 tablets (75 mg total) by mouth 3 (three) times daily. 60 tablet 0  . loratadine (CLARITIN) 10 MG tablet Take 10 mg by mouth daily.    . magnesium hydroxide (MILK OF MAGNESIA) 400 MG/5ML suspension Take 30 mLs by mouth daily as needed for mild constipation.    . metoprolol (TOPROL-XL) 200 MG 24 hr tablet Take 200 mg by mouth daily.    . pravastatin (PRAVACHOL) 40 MG  tablet Take 40 mg by mouth daily.    . valACYclovir (VALTREX) 1000 MG tablet Take 1 tablet (1,000 mg total) by mouth 3 (three) times daily. 21 tablet 0  . acetaminophen (TYLENOL) 325 MG tablet Take 650 mg by mouth every 6 (six) hours as needed for mild pain or moderate pain.    Marland Kitchen. aspirin 325 MG tablet Take 1 tablet (325 mg total) by mouth daily. (Patient not taking: Reported on 10/08/2016) 30 tablet 11  . feeding supplement, ENSURE COMPLETE, (ENSURE COMPLETE) LIQD Take 237 mLs by mouth 2 (two) times daily between meals. (Patient not taking: Reported on 10/08/2016) 100 Bottle 2  . HYDROcodone-acetaminophen (NORCO) 5-325 MG per tablet Take 1-2 tablets by mouth every 6 (six) hours as needed. (Patient not taking: Reported on 10/08/2016) 90 tablet 0  . ondansetron (ZOFRAN) 4 MG tablet Take 1 tablet (4 mg total) by mouth every 6 (six) hours. (Patient not taking: Reported on 10/08/2016) 12 tablet 0  . pantoprazole (PROTONIX) 20 MG tablet Take 1 tablet (20 mg total) by mouth daily. (Patient not taking: Reported on 10/08/2016) 30 tablet 1  . polyethylene glycol (MIRALAX / GLYCOLAX) packet Take 17 g by mouth daily as needed for mild constipation. (Patient not taking: Reported on 10/08/2016) 14 each 0  . predniSONE (DELTASONE) 20 MG tablet Take 2 tablets (40 mg total) by mouth daily. (Patient not taking: Reported on 10/08/2016) 8 tablet 0  . promethazine (PHENERGAN) 25 MG tablet Take 1 tablet (25 mg total) by mouth every 6 (six) hours as needed for nausea. (Patient not taking: Reported on 10/08/2016) 30 tablet 1  . senna-docusate (SENOKOT S) 8.6-50 MG per tablet Take 1 tablet by mouth at bedtime as needed. (Patient not taking: Reported on 10/08/2016) 30 tablet 1  . sorbitol 70 % SOLN Take 30 mLs by mouth daily as needed for moderate constipation. (Patient not taking: Reported on 10/08/2016) 500 mL 2    Objective: BP 182/91   Pulse (!) 57   Temp 97.8 F (36.6 C) (Oral)   Resp 14   SpO2 97%  Exam: General:  NAD, rests comfortably in bed, elderly AA woman, pleasant Eyes: PERRL, EOMI, no scleral icterus, no conjunctival  ENTM: MMM, no pharyngeal erythema or exudate, no rhinorrhea or congestion Neck: supple, no thyromegaly Cardiovascular: RRR, no m/r/g Respiratory: CTA bil, no W/R/R appreciated Gastrointestinal: Soft and nontender, nondistended, +BS MSK: moves all extremities equally, trace pitting edema Derm: right flank and RLQ abdomen with scab lesions in the T10 dermatome Neuro: Alert to self and place, unable to give year, or date, 4+ strength in four extremities, CNs grossly intact, nonfocal exam, follows commands Psych: appropriate mood and affect  Labs and Imaging: Results for orders placed or performed during the hospital encounter of 10/08/16 (from the past 24 hour(s))  CBC with Differential     Status: Abnormal  Collection Time: 10/08/16 11:17 AM  Result Value Ref Range   WBC 18.4 (H) 4.0 - 10.5 K/uL   RBC 5.13 (H) 3.87 - 5.11 MIL/uL   Hemoglobin 14.9 12.0 - 15.0 g/dL   HCT 16.1 09.6 - 04.5 %   MCV 80.1 78.0 - 100.0 fL   MCH 29.0 26.0 - 34.0 pg   MCHC 36.3 (H) 30.0 - 36.0 g/dL   RDW 40.9 81.1 - 91.4 %   Platelets 334 150 - 400 K/uL   Neutrophils Relative % 71 %   Lymphocytes Relative 18 %   Monocytes Relative 11 %   Eosinophils Relative 0 %   Basophils Relative 0 %   Neutro Abs 13.1 (H) 1.7 - 7.7 K/uL   Lymphs Abs 3.3 0.7 - 4.0 K/uL   Monocytes Absolute 2.0 (H) 0.1 - 1.0 K/uL   Eosinophils Absolute 0.0 0.0 - 0.7 K/uL   Basophils Absolute 0.0 0.0 - 0.1 K/uL   RBC Morphology BURR CELLS    WBC Morphology ATYPICAL LYMPHOCYTES    Smear Review LARGE PLATELETS PRESENT   Basic metabolic panel     Status: Abnormal   Collection Time: 10/08/16 11:17 AM  Result Value Ref Range   Sodium 113 (LL) 135 - 145 mmol/L   Potassium 3.9 3.5 - 5.1 mmol/L   Chloride 76 (L) 101 - 111 mmol/L   CO2 27 22 - 32 mmol/L   Glucose, Bld 108 (H) 65 - 99 mg/dL   BUN 18 6 - 20 mg/dL   Creatinine,  Ser 7.82 0.44 - 1.00 mg/dL   Calcium 8.9 8.9 - 95.6 mg/dL   GFR calc non Af Amer 48 (L) >60 mL/min   GFR calc Af Amer 55 (L) >60 mL/min   Anion gap 10 5 - 15  Troponin I     Status: None   Collection Time: 10/08/16 11:17 AM  Result Value Ref Range   Troponin I <0.03 <0.03 ng/mL  Urinalysis, Routine w reflex microscopic (not at [facility])     Status: Abnormal   Collection Time: 10/08/16 12:08 PM  Result Value Ref Range   Color, Urine YELLOW YELLOW   APPearance CLEAR CLEAR   Specific Gravity, Urine 1.016 1.005 - 1.030   pH 7.0 5.0 - 8.0   Glucose, UA NEGATIVE NEGATIVE mg/dL   Hgb urine dipstick NEGATIVE NEGATIVE   Bilirubin Urine NEGATIVE NEGATIVE   Ketones, ur NEGATIVE NEGATIVE mg/dL   Protein, ur NEGATIVE NEGATIVE mg/dL   Nitrite NEGATIVE NEGATIVE   Leukocytes, UA SMALL (A) NEGATIVE  Urine microscopic-add on     Status: Abnormal   Collection Time: 10/08/16 12:08 PM  Result Value Ref Range   Squamous Epithelial / LPF 0-5 (A) NONE SEEN   WBC, UA 0-5 0 - 5 WBC/hpf   RBC / HPF 0-5 0 - 5 RBC/hpf   Bacteria, UA NONE SEEN NONE SEEN  Sodium, urine, random     Status: None   Collection Time: 10/08/16 12:08 PM  Result Value Ref Range   Sodium, Ur 58 mmol/L  Creatinine, urine, random     Status: None   Collection Time: 10/08/16 12:08 PM  Result Value Ref Range   Creatinine, Urine 63.99 mg/dL  Osmolality, urine     Status: None   Collection Time: 10/08/16 12:08 PM  Result Value Ref Range   Osmolality, Ur 491 300 - 900 mOsm/kg  Osmolality     Status: Abnormal   Collection Time: 10/08/16  1:33 PM  Result Value Ref Range  Osmolality 242 (LL) 275 - 295 mOsm/kg   Ct Head Wo Contrast 10/08/2016 CLINICAL DATA:  Weakness for several days. Recent diagnosis of shingles FINDINGS: Brain: No evidence of acute infarction, hemorrhage, hydrocephalus, extra-axial collection or mass lesion/mass effect. There is mild diffuse low-attenuation within the subcortical and periventricular white matter  compatible with chronic microvascular disease. Prominence of the sulci and ventricles noted compatible with brain atrophy. Vascular: No hyperdense vessel or unexpected calcification. Skull: Normal. Negative for fracture or focal lesion. Sinuses/Orbits: No acute finding. Other: None.   IMPRESSION:  1. No acute intracranial abnormality.  2. Chronic small vessel ischemic change and brain atrophy.    Howard Pouch, MD 10/08/2016, 1:58 PM PGY-1, [facility] Family Medicine FPTS Intern pager: [phone], text pages welcome  FPTS Upper-Level Resident Addendum  I have independently interviewed and examined the patient. I have discussed the above with the original author and agree with their documentation. My edits for correction/addition/clarification are in pink. Please see also any attending notes.   Pincus Large, DO PGY-3, [place] [facility] FPTS Service pager: [phone] (text pages welcome through [facility])

## 2016-10-09 ENCOUNTER — Telehealth: Payer: Self-pay | Admitting: Internal Medicine

## 2016-10-09 DIAGNOSIS — Z66 Do not resuscitate: Secondary | ICD-10-CM | POA: Diagnosis not present

## 2016-10-09 DIAGNOSIS — E2749 Other adrenocortical insufficiency: Secondary | ICD-10-CM | POA: Diagnosis not present

## 2016-10-09 DIAGNOSIS — G9340 Encephalopathy, unspecified: Secondary | ICD-10-CM | POA: Diagnosis not present

## 2016-10-09 DIAGNOSIS — M419 Scoliosis, unspecified: Secondary | ICD-10-CM | POA: Diagnosis not present

## 2016-10-09 DIAGNOSIS — I4510 Unspecified right bundle-branch block: Secondary | ICD-10-CM | POA: Diagnosis not present

## 2016-10-09 DIAGNOSIS — E222 Syndrome of inappropriate secretion of antidiuretic hormone: Secondary | ICD-10-CM | POA: Diagnosis not present

## 2016-10-09 DIAGNOSIS — Z515 Encounter for palliative care: Secondary | ICD-10-CM | POA: Diagnosis not present

## 2016-10-09 DIAGNOSIS — K922 Gastrointestinal hemorrhage, unspecified: Secondary | ICD-10-CM | POA: Diagnosis not present

## 2016-10-09 DIAGNOSIS — B029 Zoster without complications: Secondary | ICD-10-CM | POA: Diagnosis not present

## 2016-10-09 DIAGNOSIS — E8770 Fluid overload, unspecified: Secondary | ICD-10-CM | POA: Diagnosis not present

## 2016-10-09 DIAGNOSIS — Z681 Body mass index (BMI) 19 or less, adult: Secondary | ICD-10-CM | POA: Diagnosis not present

## 2016-10-09 DIAGNOSIS — E43 Unspecified severe protein-calorie malnutrition: Secondary | ICD-10-CM | POA: Diagnosis not present

## 2016-10-09 LAB — PHOSPHORUS: Phosphorus: 2.8 mg/dL (ref 2.5–4.6)

## 2016-10-09 LAB — BASIC METABOLIC PANEL
ANION GAP: 11 (ref 5–15)
ANION GAP: 12 (ref 5–15)
BUN: 18 mg/dL (ref 6–20)
BUN: 21 mg/dL — ABNORMAL HIGH (ref 6–20)
CHLORIDE: 76 mmol/L — AB (ref 101–111)
CHLORIDE: 74 mmol/L — AB (ref 101–111)
CO2: 25 mmol/L (ref 22–32)
CO2: 26 mmol/L (ref 22–32)
Calcium: 8.1 mg/dL — ABNORMAL LOW (ref 8.9–10.3)
Calcium: 8.4 mg/dL — ABNORMAL LOW (ref 8.9–10.3)
Creatinine, Ser: 0.99 mg/dL (ref 0.44–1.00)
Creatinine, Ser: 1.22 mg/dL — ABNORMAL HIGH (ref 0.44–1.00)
GFR calc Af Amer: 55 mL/min — ABNORMAL LOW (ref >60)
GFR calc non Af Amer: 47 mL/min — ABNORMAL LOW (ref >60)
GFR, EST AFRICAN AMERICAN: 43 mL/min — AB (ref >60)
GFR, EST NON AFRICAN AMERICAN: 37 mL/min — AB (ref >60)
GLUCOSE: 107 mg/dL — AB (ref 65–99)
Glucose, Bld: 117 mg/dL — ABNORMAL HIGH (ref 65–99)
POTASSIUM: 3.2 mmol/L — AB (ref 3.5–5.1)
POTASSIUM: 4.6 mmol/L (ref 3.5–5.1)
SODIUM: 112 mmol/L — AB (ref 135–145)
Sodium: 112 mmol/L — CL (ref 135–145)

## 2016-10-09 LAB — UREA NITROGEN, URINE: Urea Nitrogen, Ur: 648 mg/dL

## 2016-10-09 LAB — TROPONIN I: Troponin I: 0.03 ng/mL (ref <0.03)

## 2016-10-09 LAB — CBC
HEMATOCRIT: 38.1 % (ref 36.0–46.0)
HEMOGLOBIN: 13.7 g/dL (ref 12.0–15.0)
MCH: 28.5 pg (ref 26.0–34.0)
MCHC: 36 g/dL (ref 30.0–36.0)
MCV: 79.2 fL (ref 78.0–100.0)
Platelets: 328 10*3/uL (ref 150–400)
RBC: 4.81 MIL/uL (ref 3.87–5.11)
RDW: 12.5 % (ref 11.5–15.5)
WBC: 16.5 10*3/uL — ABNORMAL HIGH (ref 4.0–10.5)

## 2016-10-09 LAB — MAGNESIUM: Magnesium: 2.1 mg/dL (ref 1.7–2.4)

## 2016-10-09 LAB — SAVE SMEAR

## 2016-10-09 MED ORDER — ONDANSETRON 4 MG PO TBDP: 4.0000 mg | ORAL_TABLET | Freq: Three times a day (TID) | ORAL | Status: DC | PRN

## 2016-10-09 MED ORDER — SENNA 8.6 MG PO TABS
1.0000 | ORAL_TABLET | Freq: Once | ORAL | Status: AC
  Administered 2016-10-09: 8.6 mg via ORAL
  Filled 2016-10-09: qty 1

## 2016-10-09 MED ORDER — POLYETHYLENE GLYCOL 3350 17 G PO PACK
17.0000 g | PACK | Freq: Two times a day (BID) | ORAL | Status: DC | PRN
  Administered 2016-10-09 – 2016-10-15 (×4): 17 g via ORAL
  Filled 2016-10-09 (×4): qty 1

## 2016-10-09 MED ORDER — GLYCERIN (LAXATIVE) 2.1 G RE SUPP
1.0000 | Freq: Every day | RECTAL | Status: DC | PRN
  Administered 2016-10-10 – 2016-10-17 (×3): 1 via RECTAL
  Filled 2016-10-09 (×7): qty 1

## 2016-10-09 MED ORDER — ONDANSETRON HCL 4 MG/2ML IJ SOLN
4.0000 mg | Freq: Once | INTRAMUSCULAR | Status: AC
  Administered 2016-10-09: 4 mg via INTRAVENOUS
  Filled 2016-10-09: qty 2

## 2016-10-09 MED ORDER — POTASSIUM CHLORIDE CRYS ER 20 MEQ PO TBCR
40.0000 meq | EXTENDED_RELEASE_TABLET | Freq: Once | ORAL | Status: AC
  Administered 2016-10-09: 40 meq via ORAL
  Filled 2016-10-09: qty 2

## 2016-10-09 MED ORDER — SODIUM CHLORIDE 1 G PO TABS
0.5000 g | ORAL_TABLET | Freq: Once | ORAL | Status: AC
  Administered 2016-10-09: 0.5 g via ORAL
  Filled 2016-10-09: qty 0.5

## 2016-10-09 MED ORDER — FUROSEMIDE 20 MG PO TABS
20.0000 mg | ORAL_TABLET | Freq: Once | ORAL | Status: AC
  Administered 2016-10-09: 20 mg via ORAL
  Filled 2016-10-09: qty 1

## 2016-10-09 MED ORDER — DOCUSATE SODIUM 100 MG PO CAPS
100.0000 mg | ORAL_CAPSULE | Freq: Every day | ORAL | Status: DC
  Filled 2016-10-09 (×2): qty 1

## 2016-10-09 NOTE — Progress Notes (Signed)
Family Medicine Teaching Service Daily Progress Note Intern Pager: [phone]  Patient name: [name] Fry Medical record number: [id number] Date of birth: 04/01/1920 Age: 82 y.o. Gender: female  Primary Care Provider: [name],[name], MD Consultants: None Code Status: Full  Pt Overview and Major Events to Date:  11/17 - Admitted with hyponatremia and weakness  Assessment and Plan: [name] Fry is a 82 y.o. female presenting with generalized weakness. PMH is significant for HTN, HLD, closed right hip fracture, TIA, hx CVA, chronic anemia.   Hyponatremia-  Chronic in nature according to family. PCP is working this up. Unclear etiology at this time but most likely SIADH vs cancinoma. She meets SIADH criteria with low serum osmolality and elevated urine sodium, however does nto appear to have concentrated urine. Have ruled out hypothyroidism and hypoandrelenism. Euvolemic on exam. Most common cause of SIADH is small cell lung carcinoma. CXR with some chronic nodularity findings; no new masses. Iatrogenic causes: patient is on spironolactone which is a diuretic and has been associated with fluid/electrolyte abnormalities such as hyponatremia; holding.  Hyperglycemia ruled out with wnl blood sugars. Glucocorticoid deficiency r/o cortisol level normal.  Renal function good and not apparent cause for hyponatremia.  Na this AM 112. Patient minimally symptomatic. - overnight sodium remained low despite interventions. Want to hold off on putting a central line and do extreme measures in a 82yo woman.  -continue to monitor for symptoms  - monitor on telemetry - SLIV. No free water. - s/p 20 mg IV Lasix once.  Not much diuresis; UOP 600 - encourage salt with meals  - serial BMPs until asymptomatic and improved - will give salt tablet this AM -may need to consider nephrology consult to help with correction  -may need GOC discussion down the line  #EKG changes. Appears chronic in nature without any  significant changes rom 2015 EKG. Has ST depression in leads II, III, AVF, V4, V5. Also with T-wave inversion in II and III. RBBB. Troponins all negative. Denies chest pain. No concern for ACS at this time. Repeat EKG this AM with same findings.   # Generalized weakness, possible encephalopathy - likely secondary to hyponatremia. Encephalopathy more pronounced on initial evaluation than subsequent visit about 1 hour later, suggesting her mentation has improved/waxing and waning quality/may not be true encephalopathy. Symptomatic with generalized weakness (nonfocal) such that patient was unable to bear any weight or push herself up in her chair today, and also with altered mental status (oriented to self and place, not time, patient unable to name a close family member in the room, noted to be decreased from baseline by family). At baseline patient able to ambulate and perform ADLs. Weakness is a symptom of hyponatremia. Strength on admission 4/4. - monitor electrolytes; replace as needed. S/p Mg, K, and Phos replacement - monitor neurological exam - Head CT negative for acute abnormality - PT/OT  # HTN - BP elevated on admission to 212/91.  At home, controlled with hydralazine 75 mg TID, spironolactone 50 mg qd, metoprolol 200 mg qd. BPs in goal range this AM. - continue home hydralazine and metoprolol, hold spironolactone (diuretics can worsen hyponatremia) - monitor BP  # Shingles. Recent diagnosis. Was seen in ED on 11/12. Sent home with 4 days of prednisone and Valtrex for 7 days. Lesions looked healed and scabbed over.  -completed steroids -continue valtrex to completion -monitor  #Leukocytois. WBC 18.4 on admission; now 16.5. Most likely 2/2 recent steriod use. No signs of infection currently. She  is afebrile and vitals are stable. UA unremarkable.  -monitor   #Osteopenia vs osteoporosis.  -continue home aldronate and oscal-D  # HLD - most recent lipid panel 12/2014 with results  cholesterol 125, triglycerides 84, HDL 52, LDL 56, VLDL 17.  Patient on pravastatin 40 mg at home. - continue pravastatin and ASA  # Hx CVA - some residual throat weakness reported however no other focal neurologic deficits.  On admission, neurological exam was normal.  FEN/GI: Regular diet - encourage salt intake, SLIV Prophylaxis: Lovenox  Disposition: Pending further work-up and improvment  Subjective:  Doing well this morning. Had an episode of emesis after dinner last night. Patient states she is still feeling a little confused and weak which is different from her baseline.  Objective: Temp:  [97.6 F (36.4 C)-97.8 F (36.6 C)] 97.8 F (36.6 C) (11/17 2147) Pulse Rate:  [54-65] 61 (11/17 2147) Resp:  [13-21] 16 (11/17 2147) BP: (108-212)/(75-105) 108/75 (11/17 2132) SpO2:  [97 %-100 %] 100 % (11/17 2147) Physical Exam: General: NAD, rests comfortably in bed, elderly AA woman, pleasant Cardiovascular: RRR, no m/r/g Respiratory: CTA bil, no W/R/R appreciated Gastrointestinal: Soft and nontender, nondistended, +BS MSK: moves all extremities equally Derm: right flank and RLQ abdomen with scab lesions in the T10 dermatome Neuro: Alert, 4+ strength in four extremities, CNs grossly intact, nonfocal exam, follows commands  Laboratory:  Recent Labs Lab 10/08/16 1117 10/09/16 0517  WBC 18.4* 16.5*  HGB 14.9 13.7  HCT 41.1 38.1  PLT 334 328    Recent Labs Lab 10/08/16 1117 10/08/16 1824  NA 113* 111*  K 3.9 4.8  CL 76* 78*  CO2 27 24  BUN 18 18  CREATININE 0.98 0.92  CALCIUM 8.9 8.4*  GLUCOSE 108* 92    Imaging/Diagnostic Tests: Dg Chest 2 View  Result Date: 10/08/2016 IMPRESSION: No acute changes. Chronic bilateral pleural effusions with chronic nodularity of the right lung base. Osteopenia with old compression fractures. Aortic atherosclerosis and chronic cardiomegaly. Electronically Signed   By: Francene BoyersJames  Maxwell M.D.   On: 10/08/2016 15:47   Ct Head Wo  Contrast  Result Date: 10/08/2016 IMPRESSION: 1. No acute intracranial abnormality. 2. Chronic small vessel ischemic change and brain atrophy. Electronically Signed   By: Signa Kellaylor  Stroud M.D.   On: 10/08/2016 12:31    Pincus LargeJazma Y Mendell Bontempo, DO 10/09/2016, 6:35 AM PGY-3, [facility] FPTS Intern pager: [phone], text pages welcome

## 2016-10-09 NOTE — Progress Notes (Signed)
CRITICAL VALUE ALERT  Critical value received:  Sodium 112  Date of notification:  10/09/2016  Time of notification:  0648  Critical value read back:Yes.    Nurse who received alert:  Tama HighKristen Erven Ramson,RN  MD notified (1st page):  Family Medicine Teaching Services-Phelps, DO  Time of first page:  [phone]  Responding MD:  Phelps,DO  Time MD responded:  0654 Phelps,DO placed order for PO potassium. Will continue to monitor and treat per MD orders.

## 2016-10-09 NOTE — Progress Notes (Signed)
CRITICAL VALUE ALERT  Critical value received:  Troponin 0.03  Date of notification:  10/09/2016  Time of notification:  12:44AM  Critical value read back:Yes.    Nurse who received alert:  Tama HighKristen Nubia Ziesmer,RN  MD notified (1st page):  Phelps,DO  Time of first page:  12:46AM  Responding MD:  Phelps,DO  Time MD responded:  12:48AM Phelps,DO said no new orders would be placed at this time. Will continue to monitor and treat per MD orders.

## 2016-10-09 NOTE — Evaluation (Addendum)
Physical Therapy Evaluation Patient Details Name: [name] Fry MRN: [id number] DOB: [birth year] Today's Date: 10/09/2016   History of Present Illness  Patient is a 82 y/o famele with hx of HTN, HLD, CVA presents with weakness. Found to have Hyponatremia with chronic worsening likely due to hypotonic fluid intake with diuretics and possible SIADH.   Clinical Impression  Patient presents with generalized weakness, deconditioning, confusion and impaired balance/mobility s/p above. Pt with difficulty problem solving and following simple 1-2 step commands with increased time and multi modal cues this session most likely from impaired sodium level. Requires Min A for balance during gait training. At baseline, pt independent with ambulation and ADls and gets assist with IADLs from family. Pt lives with daughter and has 24/7 supervision. Will follow acutely to maximize independence and mobility prior to return home.     Follow Up Recommendations Home health PT;Supervision for mobility/OOB;Supervision/Assistance - 24 hour    Equipment Recommendations  None recommended by PT    Recommendations for Other Services OT consult     Precautions / Restrictions Precautions Precautions: Fall Restrictions Weight Bearing Restrictions: No      Mobility  Bed Mobility Overal bed mobility: Needs Assistance Bed Mobility: Supine to Sit     Supine to sit: Mod assist;HOB elevated     General bed mobility comments: Assist and max cues to bring LES to EOB and elevate trunk.  Transfers Overall transfer level: Needs assistance Equipment used: Rolling walker (2 wheeled) Transfers: Sit to/from Stand Sit to Stand: Min assist         General transfer comment: Assist to boost to standing with cues for hand placement/technique. Transferred to chair post ambulation bout.  Ambulation/Gait Ambulation/Gait assistance: Min assist Ambulation Distance (Feet): 35 Feet Assistive device: Rolling walker (2  wheeled) Gait Pattern/deviations: Step-through pattern;Decreased stride length;Trunk flexed;Shuffle Gait velocity: decreased   General Gait Details: Slow, mildly unsteady gait with need for therapist pushing RW forward to initiate gait. Min A for balance. Difficulty with retro gait.  Stairs            Wheelchair Mobility    Modified Rankin (Stroke Patients Only)       Balance Overall balance assessment: Needs assistance Sitting-balance support: Feet supported;Bilateral upper extremity supported Sitting balance-Leahy Scale: Fair     Standing balance support: During functional activity Standing balance-Leahy Scale: Poor Standing balance comment: Reliant on BUEs for support.                             Pertinent Vitals/Pain      Home Living Family/patient expects to be discharged to:: Private residence Living Arrangements: Children Available Help at Discharge: Family;Available 24 hours/day Type of Home: House Home Access: Stairs to enter Entrance Stairs-Rails: None Entrance Stairs-Number of Steps: 2 Home Layout: One level Home Equipment: Walker - 2 wheels;Bedside commode      Prior Function Level of Independence: Needs assistance   Gait / Transfers Assistance Needed: Uses RW for community ambulation and independent for household ambulation.  ADL's / Homemaking Assistance Needed: Family does all IADLs and drives.         Hand Dominance   Dominant Hand: Right    Extremity/Trunk Assessment   Upper Extremity Assessment: Defer to OT evaluation           Lower Extremity Assessment: Generalized weakness         Communication   Communication: No difficulties  Cognition Arousal/Alertness: Awake/alert Behavior During  Therapy: Flat affect Overall Cognitive Status: Impaired/Different from baseline Area of Impairment: Following commands;Problem solving;Safety/judgement       Following Commands: Follows one step commands inconsistently  (with increased time and cues.) Safety/Judgement: Decreased awareness of safety;Decreased awareness of deficits   Problem Solving: Difficulty sequencing;Slow processing;Decreased initiation;Requires verbal cues;Requires tactile cues General Comments: Seems confused today esp with following simple instructions- "back up" as pt walking forward.    General Comments General comments (skin integrity, edema, etc.): Granddaughter in law present during session.    Exercises     Assessment/Plan    PT Assessment Patient needs continued PT services  PT Problem List Decreased strength;Decreased mobility;Decreased safety awareness;Decreased cognition;Decreased activity tolerance;Decreased balance          PT Treatment Interventions DME instruction;Therapeutic activities;Gait training;Therapeutic exercise;Patient/family education;Balance training;Functional mobility training;Stair training    PT Goals (Current goals can be found in the Care Plan section)  Acute Rehab PT Goals Patient Stated Goal: none stated PT Goal Formulation: With patient Time For Goal Achievement: 10/23/16 Potential to Achieve Goals: Good    Frequency Min 3X/week   Barriers to discharge        Co-evaluation               End of Session Equipment Utilized During Treatment: Gait belt Activity Tolerance: Patient tolerated treatment well Patient left: in chair;with call bell/phone within reach;with family/visitor present;Other (comment) (family notified to let RN know when leaving) Nurse Communication: Mobility status         Time: [id number] PT Time Calculation (min) (ACUTE ONLY): 23 min   Charges:   PT Evaluation $PT Eval Low Complexity: 1 Procedure PT Treatments $Gait Training: 8-22 mins   PT G Codes:        Vihaan Gloss A Starkisha Tullis 10/09/2016, 9:58 AM  Mylo RedShauna Daviyon Widmayer, PT, DPT [phone]

## 2016-10-10 ENCOUNTER — Inpatient Hospital Stay (HOSPITAL_COMMUNITY): Payer: PPO

## 2016-10-10 DIAGNOSIS — R531 Weakness: Secondary | ICD-10-CM

## 2016-10-10 DIAGNOSIS — D72829 Elevated white blood cell count, unspecified: Secondary | ICD-10-CM

## 2016-10-10 DIAGNOSIS — R109 Unspecified abdominal pain: Secondary | ICD-10-CM | POA: Diagnosis not present

## 2016-10-10 LAB — CBC WITH DIFFERENTIAL/PLATELET
Basophils Absolute: 0 10*3/uL (ref 0.0–0.1)
Basophils Relative: 0 %
EOS ABS: 0 10*3/uL (ref 0.0–0.7)
Eosinophils Relative: 0 %
HEMATOCRIT: 36.9 % (ref 36.0–46.0)
HEMOGLOBIN: 13.5 g/dL (ref 12.0–15.0)
LYMPHS ABS: 2.9 10*3/uL (ref 0.7–4.0)
LYMPHS PCT: 14 %
MCH: 29.2 pg (ref 26.0–34.0)
MCHC: 36.6 g/dL — AB (ref 30.0–36.0)
MCV: 79.7 fL (ref 78.0–100.0)
MONOS PCT: 8 %
Monocytes Absolute: 1.7 10*3/uL — ABNORMAL HIGH (ref 0.1–1.0)
NEUTROS ABS: 16.3 10*3/uL — AB (ref 1.7–7.7)
NEUTROS PCT: 78 %
Platelets: 288 10*3/uL (ref 150–400)
RBC: 4.63 MIL/uL (ref 3.87–5.11)
RDW: 12.4 % (ref 11.5–15.5)
WBC: 20.8 10*3/uL — ABNORMAL HIGH (ref 4.0–10.5)

## 2016-10-10 LAB — BASIC METABOLIC PANEL
Anion gap: 13 (ref 5–15)
BUN: 27 mg/dL — ABNORMAL HIGH (ref 6–20)
CHLORIDE: 78 mmol/L — AB (ref 101–111)
CO2: 22 mmol/L (ref 22–32)
CREATININE: 1.14 mg/dL — AB (ref 0.44–1.00)
Calcium: 8.1 mg/dL — ABNORMAL LOW (ref 8.9–10.3)
GFR calc non Af Amer: 40 mL/min — ABNORMAL LOW (ref >60)
GFR, EST AFRICAN AMERICAN: 46 mL/min — AB (ref >60)
Glucose, Bld: 101 mg/dL — ABNORMAL HIGH (ref 65–99)
Potassium: 4.2 mmol/L (ref 3.5–5.1)
Sodium: 113 mmol/L — CL (ref 135–145)

## 2016-10-10 LAB — LACTIC ACID, PLASMA: LACTIC ACID, VENOUS: 1.5 mmol/L (ref 0.5–1.9)

## 2016-10-10 MED ORDER — DOCUSATE SODIUM 100 MG PO CAPS
200.0000 mg | ORAL_CAPSULE | Freq: Every day | ORAL | Status: DC
  Administered 2016-10-14: 200 mg via ORAL
  Filled 2016-10-10 (×2): qty 2

## 2016-10-10 MED ORDER — VALACYCLOVIR HCL 500 MG PO TABS
1000.0000 mg | ORAL_TABLET | Freq: Two times a day (BID) | ORAL | Status: DC
  Administered 2016-10-10: 1000 mg via ORAL
  Filled 2016-10-10 (×2): qty 2

## 2016-10-10 MED ORDER — FUROSEMIDE 20 MG PO TABS
20.0000 mg | ORAL_TABLET | Freq: Once | ORAL | Status: AC
  Administered 2016-10-10: 20 mg via ORAL
  Filled 2016-10-10: qty 1

## 2016-10-10 MED ORDER — SODIUM CHLORIDE 1 G PO TABS
1.0000 g | ORAL_TABLET | Freq: Three times a day (TID) | ORAL | Status: DC
  Administered 2016-10-10 – 2016-10-13 (×5): 1 g via ORAL
  Filled 2016-10-10 (×11): qty 1

## 2016-10-10 MED ORDER — SIMETHICONE 40 MG/0.6ML PO SUSP
40.0000 mg | Freq: Four times a day (QID) | ORAL | Status: DC | PRN
  Filled 2016-10-10: qty 0.6

## 2016-10-10 NOTE — Progress Notes (Signed)
Family Medicine Teaching Service Daily Progress Note Intern Pager: [phone]  Patient name: [name] Fry Medical record number: [id number] Date of birth: 19-Mar-1920 Age: 82 y.o. Gender: female  Primary Care Provider: [name],[name], MD Consultants: None Code Status: Full  Pt Overview and Major Events to Date:  11/17 - Admitted with hyponatremia and weakness  Assessment and Plan: [name] Fry is a 82 y.o. female presenting with generalized weakness. PMH is significant for HTN, HLD, closed right hip fracture, TIA, hx CVA, chronic anemia.   Hyponatremia, not improving-  Chronic in nature according to family. PCP is working this up. Unclear etiology at this time but most likely SIADH vs cancinoma. She meets SIADH criteria with low serum osmolality and elevated urine sodium, however does nto appear to have concentrated urine. Have ruled out hypothyroidism and hypoandrelenism. Euvolemic on exam. Most common cause of SIADH is small cell lung carcinoma. CXR with some chronic nodularity findings; no new masses. Iatrogenic causes: patient is on spironolactone which is a diuretic and has been associated with fluid/electrolyte abnormalities such as hyponatremia; holding.  Hyperglycemia ruled out with wnl blood sugars. Glucocorticoid deficiency r/o cortisol level normal.  Renal function good and not apparent cause for hyponatremia.  Na this AM 113. Patient minimally symptomatic. - overnight sodium remained low despite interventions. Want to hold off on putting a central line and do extreme measures in a 82yo woman.  -continue to monitor for symptoms  - monitor on telemetry - SLIV. No free water. Fluid restrict to <1000 ml daily - s/p 20 mg IV Lasix x2, will order another 20 mg lasix today   - encourage salt with meals, salt tablets given but poorly tolerated (patient refuses) - serial BMPs until asymptomatic and improved - s/p give salt tablet yesterday, now ordered salt tablets TID - may need to  consider nephrology consult to help with correction  - may need GOC discussion down the line  #Abdominal pain, possibly 2/2 constipation:  Patient with diffuse abdominal pain, one hard stool yesterday with multiple episodes of emesis yesterday per family at bedside.  Non-surgical abdomen this morning but patient diaphoretic and clearly uncomfortable this morning - inc colace to 200 mg qd - continue miralax 17g BID - will get plain abdominal films, consider need for CT abdomen. Suspect stool burden  #EKG changes, stable Appears chronic in nature without any significant changes rom 2015 EKG. Has ST depression in leads II, III, AVF, V4, V5. Also with T-wave inversion in II and III. RBBB. Troponins all negative. Denies chest pain. No concern for ACS at this time. Repeat EKG yesterday AM with same findings.   # Generalized weakness, possible encephalopathy, improved - likely secondary to hyponatremia. Encephalopathy more pronounced on initial evaluation than subsequent visit about 1 hour later, suggesting her mentation has improved/waxing and waning quality/may not be true encephalopathy.  At baseline patient able to ambulate and perform ADLs. Weakness is a symptom of hyponatremia. Strength on admission 4/4. - monitor electrolytes; replace as needed. S/p Mg, K, and Phos replacement now WNL - monitor neurological exam - Head CT negative for acute abnormality - PT/OT  # HTN, stable - BP controlled overnight 136/71. Elevated on admission w/o taking afternoon meds.  At home, controlled with hydralazine 75 mg TID, spironolactone 50 mg qd, metoprolol 200 mg qd.  - continue home hydralazine and metoprolol, hold spironolactone (diuretics can worsen hyponatremia) - monitor BP  # Shingles, stable. Recent diagnosis. Was seen in ED on 11/12. Sent home with 4  days of prednisone and Valtrex for 7 days. Lesions looked healed and scabbed over.  -completed steroids -continue valtrex to  completion -monitor  #Leukocytosis, worsened WBC 18.4>>16.5>>20.8  Most likely 2/2 recent steriod use. No signs of infection currently. She is afebrile and vitals are stable. UA unremarkable.  - 78% neutrophils, elevated16.3 K/ul neutrophil antibiodies [ref 1.7-7.7], elevated monocytes 1.7 [ref0.1-1.0] - monitor  #Osteopenia vs osteoporosis, stable -continue home aldronate and oscal-D  # HLD - most recent lipid panel 12/2014 with results cholesterol 125, triglycerides 84, HDL 52, LDL 56, VLDL 17.  Patient on pravastatin 40 mg at home. - continue pravastatin and ASA  # Hx CVA - some residual throat weakness reported however no other focal neurologic deficits.  On admission, neurological exam was normal.  FEN/GI: Regular diet - encourage salt intake, SLIV Prophylaxis: Lovenox  Disposition: Pending further work-up and improvment  Subjective:  Patient complains of diffuse abdominal pain. Appears uncomfortable on exam. Refuses to answer questions, wishes family members at bedside to answer for her due to pain.  Per family no emesis overnight, one hard stool yesterday. Per family patient has been conversing appropriately and has not seemed confused to them.  Objective: Temp:  [97.4 F (36.3 C)-97.7 F (36.5 C)] 97.7 F (36.5 C) (11/19 0528) Pulse Rate:  [56-73] 73 (11/19 0528) Resp:  [16-18] 18 (11/19 0528) BP: (105-189)/(52-112) 136/71 (11/19 0528) SpO2:  [96 %-100 %] 100 % (11/19 0528) Physical Exam: General: NAD, rests comfortably in bed, elderly AA woman, pleasant Cardiovascular: RRR, no m/r/g Respiratory: CTA bil, no W/R/R appreciated Gastrointestinal: Soft and mildly diffusely tender, nondistended, +BS, no HSM, no rebound or guarding MSK: moves all extremities equally Derm: right flank and RLQ abdomen with scab lesions in the T10 dermatome Neuro: Alert, 4+ strength in four extremities, CNs grossly intact, nonfocal exam, follows commands Psych: Oriented to place, refuses to  answer other questions about orientation  Laboratory: CBC with diff WBC 20.8, 78% neutrophils, elevated K/ul neutrophil antibodies at 16.3 [ref 1.7-7.7], elevated monocytes at 1.7 [ref0.1-1.0], 14% lymphocytes, 0 eosihophils, 0 basophils  Recent Labs Lab 10/08/16 1117 10/09/16 0517 10/10/16 0559  WBC 18.4* 16.5* 20.8*  HGB 14.9 13.7 13.5  HCT 41.1 38.1 36.9  PLT 334 328 288    Recent Labs Lab 10/09/16 0517 10/09/16 1617 10/10/16 0559  NA 112* 112* 113*  K 3.2* 4.6 4.2  CL 76* 74* 78*  CO2 25 26 22   BUN 18 21* 27*  CREATININE 0.99 1.22* 1.14*  CALCIUM 8.1* 8.4* 8.1*  GLUCOSE 107* 117* 101*    Imaging/Diagnostic Tests: Dg Chest 2 View  10/08/2016 IMPRESSION: No acute changes. Chronic bilateral pleural effusions with chronic nodularity of the right lung base. Osteopenia with old compression fractures. Aortic atherosclerosis and chronic cardiomegaly. Electronically Signed   By: Francene BoyersJames  Maxwell M.D.   On: 10/08/2016 15:47   Ct Head Wo Contrast 10/08/2016 IMPRESSION:  1. No acute intracranial abnormality.  2. Chronic small vessel ischemic change and brain atrophy.   Howard PouchLauren Franca Stakes, MD 10/10/2016, 10:02 AM PGY-1, [facility] FPTS Intern pager: [phone], text pages welcome

## 2016-10-10 NOTE — Telephone Encounter (Signed)
Entered in error

## 2016-10-11 ENCOUNTER — Inpatient Hospital Stay (HOSPITAL_COMMUNITY): Payer: PPO

## 2016-10-11 ENCOUNTER — Encounter (HOSPITAL_COMMUNITY): Payer: Self-pay | Admitting: *Deleted

## 2016-10-11 DIAGNOSIS — R109 Unspecified abdominal pain: Secondary | ICD-10-CM | POA: Diagnosis not present

## 2016-10-11 DIAGNOSIS — R531 Weakness: Secondary | ICD-10-CM | POA: Diagnosis not present

## 2016-10-11 DIAGNOSIS — J90 Pleural effusion, not elsewhere classified: Secondary | ICD-10-CM | POA: Diagnosis not present

## 2016-10-11 DIAGNOSIS — K922 Gastrointestinal hemorrhage, unspecified: Secondary | ICD-10-CM | POA: Diagnosis not present

## 2016-10-11 DIAGNOSIS — E871 Hypo-osmolality and hyponatremia: Secondary | ICD-10-CM | POA: Diagnosis not present

## 2016-10-11 LAB — HEMOGLOBIN AND HEMATOCRIT, BLOOD
HCT: 27.8 % — ABNORMAL LOW (ref 36.0–46.0)
HEMATOCRIT: 29.4 % — AB (ref 36.0–46.0)
HEMOGLOBIN: 9.8 g/dL — AB (ref 12.0–15.0)
Hemoglobin: 10.5 g/dL — ABNORMAL LOW (ref 12.0–15.0)

## 2016-10-11 LAB — BASIC METABOLIC PANEL
ANION GAP: 7 (ref 5–15)
BUN: 38 mg/dL — ABNORMAL HIGH (ref 6–20)
CHLORIDE: 82 mmol/L — AB (ref 101–111)
CO2: 27 mmol/L (ref 22–32)
Calcium: 8.2 mg/dL — ABNORMAL LOW (ref 8.9–10.3)
Creatinine, Ser: 1.15 mg/dL — ABNORMAL HIGH (ref 0.44–1.00)
GFR calc Af Amer: 46 mL/min — ABNORMAL LOW (ref >60)
GFR, EST NON AFRICAN AMERICAN: 39 mL/min — AB (ref >60)
GLUCOSE: 115 mg/dL — AB (ref 65–99)
POTASSIUM: 3.7 mmol/L (ref 3.5–5.1)
SODIUM: 116 mmol/L — AB (ref 135–145)

## 2016-10-11 LAB — CBC
HEMATOCRIT: 30.2 % — AB (ref 36.0–46.0)
HEMOGLOBIN: 11 g/dL — AB (ref 12.0–15.0)
MCH: 29.2 pg (ref 26.0–34.0)
MCHC: 36.4 g/dL — ABNORMAL HIGH (ref 30.0–36.0)
MCV: 80.1 fL (ref 78.0–100.0)
Platelets: 289 10*3/uL (ref 150–400)
RBC: 3.77 MIL/uL — ABNORMAL LOW (ref 3.87–5.11)
RDW: 12.9 % (ref 11.5–15.5)
WBC: 17.8 10*3/uL — AB (ref 4.0–10.5)

## 2016-10-11 LAB — MRSA PCR SCREENING: MRSA BY PCR: NEGATIVE

## 2016-10-11 MED ORDER — SODIUM CHLORIDE 0.9 % IV SOLN
80.0000 mg | Freq: Once | INTRAVENOUS | Status: AC
  Administered 2016-10-11: 80 mg via INTRAVENOUS
  Filled 2016-10-11: qty 80

## 2016-10-11 MED ORDER — PANTOPRAZOLE SODIUM 40 MG IV SOLR: 40.0000 mg | Freq: Two times a day (BID) | INTRAVENOUS | Status: DC

## 2016-10-11 MED ORDER — ONDANSETRON HCL 4 MG/2ML IJ SOLN
4.0000 mg | Freq: Three times a day (TID) | INTRAMUSCULAR | Status: DC | PRN
  Administered 2016-10-11 – 2016-10-12 (×2): 4 mg via INTRAVENOUS
  Filled 2016-10-11 (×2): qty 2

## 2016-10-11 MED ORDER — SODIUM CHLORIDE 0.9 % IV SOLN
8.0000 mg/h | INTRAVENOUS | Status: DC
  Administered 2016-10-11 – 2016-10-12 (×3): 8 mg/h via INTRAVENOUS
  Filled 2016-10-11 (×11): qty 80

## 2016-10-11 NOTE — Progress Notes (Signed)
OT Cancellation Note  Patient Details Name: [name] Fry MRN: [id number] DOB: November 26, 1919   Cancelled Treatment:    Reason Eval/Treat Not Completed: Medical issues which prohibited therapy. Pt continues this AM with emesis of bright red blood and transferring to SDU, spoke to RN and will hold therapy today.  Evette GeorgesLeonard, Athalene Kolle Eva [phone] 10/11/2016, 9:27 AM

## 2016-10-11 NOTE — Progress Notes (Signed)
Followed up at 0815 - patient alert - warm and dry - another episode of "frothy" red fluid suctioned from mouth prior to my arrival per RN and NT.  VSS - see VS chart.  Patient with mild discomfort - cannot articulate particular complaint - "just don't feel good".  Resps reg and unlabored - strong cough with good airway protection.  Lungs clear.  Abd soft - denies pain.  Oral cavity without evidence of source of bleed.  Small amount of thrush on tongue - no teeth.  Dr. Lum BabeEniola at bedside.  Orders noted.  #20 angio cath inserted left antecubital for second IV access.  To transfer to SDU and GI consult per MD.  Harvie BridgeHandoff to [facility] LPCierra RN - to call as needed.

## 2016-10-11 NOTE — Consult Note (Signed)
Referring Provider:  Dr. Abelardo DieselMcMullen  Primary Care Physician:  [name],[name], MD Primary Gastroenterologist:  Gentry FitzUnassigned   Reason for Consultation:   ? GI Bleed  HPI: [name] Fry is a 82 y.o. female admitted to the hospital on 10/08/2016 for fatigue and weakness. Was found to have a hyponatremia. Patient was found to have blood around her mouth this morning. GI is consulted for possible GI bleed. Patient seen and examined. Patient remains, and comfortable in the bed. Not able to get much history from patient. History obtained from granddaughter and from nursing staff. Denied any vomiting. Denied any black stool. No bowel movement since 4 days. Denied abdominal pain. Based on history, she  does not appear to have hematemesis. She may have hemoptysis.   No prior endoscopic workup.   Past Medical History:  Diagnosis Date  . High cholesterol   . Hypertension   . Stroke [facility](HCC)     Past Surgical History:  Procedure Laterality Date  . HERNIA REPAIR    . HIP ARTHROPLASTY Right 07/10/2014   Procedure: ARTHROPLASTY BIPOLAR HIP;  Surgeon: Cheral AlmasNaiping Michael Xu, MD;  [facility];  Service: Orthopedics;  Laterality: Right;    Prior to Admission medications   Medication Sig Start Date End Date Taking? Authorizing Provider  alendronate (FOSAMAX) 70 MG tablet Take 70 mg by mouth every Wednesday. Take with a full glass of water on an empty stomach.   Yes Historical Provider, MD  aspirin EC 81 MG tablet Take 162 mg by mouth 2 (two) times daily.   Yes Historical Provider, MD  calcium-vitamin D (OSCAL WITH D) 500-200 MG-UNIT per tablet Take 1 tablet by mouth daily with breakfast. Patient taking differently: Take 1 tablet by mouth 3 (three) times daily.  07/10/14  Yes Naiping Donnelly StagerM Xu, MD  hydrALAZINE (APRESOLINE) 50 MG tablet Take 1.5 tablets (75 mg total) by mouth 3 (three) times daily. 12/26/14  Yes Ames DuraStephen Balleh, MD  loratadine (CLARITIN) 10 MG tablet Take 10 mg by mouth daily.   Yes Historical Provider, MD   magnesium hydroxide (MILK OF MAGNESIA) 400 MG/5ML suspension Take 30 mLs by mouth daily as needed for mild constipation.   Yes Historical Provider, MD  metoprolol (TOPROL-XL) 200 MG 24 hr tablet Take 200 mg by mouth daily.   Yes Historical Provider, MD  Pramoxine-Camphor-Zinc Acetate (ANTI ITCH EX) Apply 1 application topically as needed (for itching).   Yes Historical Provider, MD  pravastatin (PRAVACHOL) 40 MG tablet Take 40 mg by mouth daily.   Yes Historical Provider, MD  spironolactone (ALDACTONE) 50 MG tablet Take 50 mg by mouth daily.   Yes Historical Provider, MD  valACYclovir (VALTREX) 1000 MG tablet Take 1 tablet (1,000 mg total) by mouth 3 (three) times daily. 10/03/16  Yes Bethel BornKelly [name] Gekas, PA-C  acetaminophen (TYLENOL) 325 MG tablet Take 650 mg by mouth every 6 (six) hours as needed for mild pain or moderate pain.    Historical Provider, MD  aspirin 325 MG tablet Take 1 tablet (325 mg total) by mouth daily. Patient not taking: Reported on 10/08/2016 12/23/14   Adrian BlackwaterEverett J Moding, MD  feeding supplement, ENSURE COMPLETE, (ENSURE COMPLETE) LIQD Take 237 mLs by mouth 2 (two) times daily between meals. Patient not taking: Reported on 10/08/2016 07/12/14   Richarda OverlieNayana Abrol, MD  HYDROcodone-acetaminophen (NORCO) 5-325 MG per tablet Take 1-2 tablets by mouth every 6 (six) hours as needed. Patient not taking: Reported on 10/08/2016 07/10/14   Tarry KosNaiping M Xu, MD  ondansetron [facility](ZOFRAN) 4  MG tablet Take 1 tablet (4 mg total) by mouth every 6 (six) hours. Patient not taking: Reported on 10/08/2016 12/26/14   Ames Dura, MD  pantoprazole (PROTONIX) 20 MG tablet Take 1 tablet (20 mg total) by mouth daily. Patient not taking: Reported on 10/08/2016 12/23/14   Adrian Blackwater Moding, MD  polyethylene glycol (MIRALAX / Ethelene Hal) packet Take 17 g by mouth daily as needed for mild constipation. Patient not taking: Reported on 10/08/2016 07/12/14   Richarda Overlie, MD  predniSONE (DELTASONE) 20 MG tablet Take 2 tablets (40  mg total) by mouth daily. Patient not taking: Reported on 10/08/2016 10/03/16   Bethel Born, PA-C  promethazine (PHENERGAN) 25 MG tablet Take 1 tablet (25 mg total) by mouth every 6 (six) hours as needed for nausea. Patient not taking: Reported on 10/08/2016 07/10/14   Tarry Kos, MD  senna-docusate (SENOKOT S) 8.6-50 MG per tablet Take 1 tablet by mouth at bedtime as needed. Patient not taking: Reported on 10/08/2016 07/10/14   Tarry Kos, MD  sorbitol 70 % SOLN Take 30 mLs by mouth daily as needed for moderate constipation. Patient not taking: Reported on 10/08/2016 07/12/14   Richarda Overlie, MD    Scheduled Meds: . calcium-vitamin D  1 tablet Oral TID WC  . docusate sodium  200 mg Oral Daily  . feeding supplement (ENSURE ENLIVE)  237 mL Oral BID BM  . hydrALAZINE  75 mg Oral TID  . pravastatin  40 mg Oral Daily  . sodium chloride flush  3 mL Intravenous Q12H  . sodium chloride flush  3 mL Intravenous Q12H  . sodium chloride  1 g Oral TID WC   Continuous Infusions: PRN Meds:.sodium chloride, acetaminophen, Glycerin (Adult), hydrALAZINE, ondansetron, polyethylene glycol, simethicone, sodium chloride flush  Allergies as of 10/08/2016  . (No Known Allergies)    Family History  Problem Relation Age of Onset  . Dementia Other   . CAD Neg Hx     Social History   Social History  . Marital status: Widowed    Spouse name: N/A  . Number of children: N/A  . Years of education: N/A   Occupational History  . Not on file.   Social History Main Topics  . Smoking status: Never Smoker  . Smokeless tobacco: Not on file  . Alcohol use No  . Drug use: No  . Sexual activity: Not on file   Other Topics Concern  . Not on file   Social History Narrative  . No narrative on file    Review of Systems: All negative except as stated above in HPI.  Physical Exam: Vital signs: Vitals:   10/11/16 0538 10/11/16 0819  BP: 131/78 (!) 144/69  Pulse: 100 99  Resp: 18   Temp: 98.9  F (37.2 C) 98.4 F (36.9 C)   Last BM Date: 10/07/16 General:   Alert,  Somewhat confused cooperative Oral mucosa without any active bleeding. No ulcers seen in the oral cavity. Lungs:  Left basilar  crackles noted.  No acute distress. Heart:  Regular rate and rhythm; no murmurs, clicks, rubs,  or gallops. Abdomen:  Soft, NT, ND, BS +  LE: trace edema    GI:  Lab Results:  Recent Labs  10/09/16 0517 10/10/16 0559 10/11/16 0456  WBC 16.5* 20.8* 17.8*  HGB 13.7 13.5 11.0*  HCT 38.1 36.9 30.2*  PLT 328 288 289   BMET  Recent Labs  10/09/16 1617 10/10/16 0559 10/11/16 0456  NA 112*  113* 116*  K 4.6 4.2 3.7  CL 74* 78* 82*  CO2 26 22 27   GLUCOSE 117* 101* 115*  BUN 21* 27* 38*  CREATININE 1.22* 1.14* 1.15*  CALCIUM 8.4* 8.1* 8.2*   LFT No results for input(s): PROT, ALBUMIN, AST, ALT, ALKPHOS, BILITOT, BILIDIR, IBILI in the last 72 hours. PT/INR No results for input(s): LABPROT, INR in the last 72 hours.   Studies/Results: Dg Abd Portable 1v  Result Date: 10/10/2016 CLINICAL DATA:  82 year old female with a history of abdominal pain and nausea vomiting EXAM: PORTABLE ABDOMEN - 1 VIEW COMPARISON:  07/10/2014 FINDINGS: Gas throughout the abdomen including small bowel and colon. No abnormal distention. Vascular calcifications and pelvic phleboliths. Scoliotic curvature of the lumbar spine with multilevel degenerative disc disease. Surgical changes of right hip arthroplasty. No displaced fracture. IMPRESSION: Nonobstructive bowel gas pattern. Signed, Yvone NeuJaime S. Loreta AveWagner, DO [facility] Electronically Signed   By: Gilmer MorJaime  Wagner D.O.   On: 10/10/2016 12:44    Impression/Plan: - Bright blood around the mouth. No vomiting. No black stool. No bowel movement in last 4 days.  - Acute drop in hemoglobin. From 13.5 to 11. No BM in last 4 days.  - hyponatremia - Altered mental  status  Recommendations ------------------------- - Not able to rule out upper GI bleed completely. Patient has mildly elevated BUN. Drop in hemoglobin also noted. - Discuss with granddaughter. Plan for conservative management for now. Discussed with the primary team. Consider chest x-ray for new onset left-sided crackles.  - Risk and benefits of upper endoscopy discussed with the granddaughter. She will discuss with other family members. - Protonix drip for now. - Keep NPO except for meds - Monitor H&H. GI will follow   LOS: 3 days   Kathi DerParag Renlee Floor  MD, FACP 10/11/2016, 9:36 AM  Pager [phone] If no answer or after 5 PM call [phone]

## 2016-10-11 NOTE — Progress Notes (Signed)
Nurse found patient trying to climb out of bed, and realized patient was bleeding from the mouth. Patient c/o of trouble with her throat. Paged on call MD and Notified on call MD of patient condition.  Patient in no acute distress, O2 sats at 100. RN stayed at bedside and suctioned patient mouth. Paged rapid response RN, [facility]. Nurse assessed patient mouth with Rapid Pesponse Nurse, no active bleeding in patient mouth or nose. Patient coughing up blood from the throat. Notified patient family of the situation. Will continue to monitor and make Day RN aware.

## 2016-10-11 NOTE — Progress Notes (Signed)
Family Medicine Teaching Service Daily Progress Note Intern Pager: [phone]  Patient name: [name] Fry Medical record number: [id number] Date of birth: 1920/07/27 Age: 82 y.o. Gender: female  Primary Care Provider: [name],[name], MD Consultants: Gastroenterology Code Status: Full  Pt Overview and Major Events to Date:  11/17: Admitted with hyponatremia and weakness 11/20: Pt spitting up blood with stable vitals and physical exam, transferred to SDU  Assessment and Plan: [name] Fry is a 82 y.o. female presenting with generalized weakness. PMH is significant for HTN, HLD, closed right hip fracture, TIA, hx CVA, chronic anemia.   #Severe Hyponatremia, Chronic, Improving: Chronic in nature according to family. PCP is working this up. Unclear etiology at this time but most likely SIADH vs cancinoma. She meets SIADH criteria with low serum osmolality and elevated urine sodium, however does not appear to have concentrated urine. Have ruled out hypothyroidism and hypoandrelenism. Euvolemic on exam. Most common cause of SIADH is small cell lung carcinoma. CXR with some chronic nodularity findings; no new masses. Iatrogenic causes: patient is on spironolactone which is a diuretic and has been associated with fluid/electrolyte abnormalities such as hyponatremia; holding. Hyperglycemia ruled out with wnl blood sugars. Glucocorticoid deficiency r/o cortisol level normal. Renal function good and not apparent cause for hyponatremia. Sodium improved to 116 this AM. Alert to self only this AM. Has bright red blood per mouth. Uncertain if pt had seizure. See below for details. --Monitor for symptoms of hyponatremia --Telemetry --SLIV, no free water, fluid restrict to <1000 ml daily --S/p 20 mg IV Lasix yesterday (3rd dose), will consider more if appropriate --Encourage salt with meals, salt tablets given but poorly tolerated (patient refuses) --Serial BMPs until asymptomatic and improved --Sodium  chloride tabs TID --May need to consider nephrology consult to help with correction if worsens  --May need GOC discussion down the line  #Bright Red Blood Per Mouth, Acute, Stable: Bright red blood power mouth with small clot 11/20. No hemorrhaging on evaluation. Tongue atraumatic. Vitals stable and pt denied pain. No witnessed seizures and difficult to evaluate if incontinent given baseline incontinence. Has sever hyponatremia so seizure is possible. No blood per nares on exam.  --Consult GI, appreciate recs --Holding ASA and Lovenox SQ --Consider abdominal CT --Will transfer to SDU  #Abdominal Pain, Possibly 2/2 Constipation, Improved:   Diffuse abdominal pain, one hard stool 11/18 with multiple episodes of emesis 11/19 per family at bedside. ABX w/ gas throughout the abdomen including small bowel and colon w/o distention w/ non-obstructive pattern. --Colace to 200 mg QD --Miralax 17g BID  #EKG Changes, Chronic, Stable:  Appears chronic in nature without any significant changes from 2015 EKG. Has ST depression in leads II, III, AVF, V4, V5. Also with T-wave inversion in II and III. RBBB. Troponins all negative. Denies chest pain. No concern for ACS at this time. Repeat EKG yesterday AM with same findings.   #Generalized Weakness, Possible Encephalopathy, Improved Likely 2/2 to hyponatremia. At baseline patient able to ambulate and perform ADLs. Weakness is a symptom of hyponatremia. Strength on admission 4/4. Head CT negative for acute abnormality. A&O to self. --Monitor electrolytes; replace as needed --Monitor neurological exam --PT/OT rec HHPT, supervision for mobility, 24 hr assistance  #Hypertension, Chronic, Stable: BP controlled overnight 131/78. Elevated on admission w/o taking afternoon meds.  At home, controlled with hydralazine 75 mg TID, spironolactone 50 mg qd, metoprolol 200 mg qd.  --Continue home hydralazine and metoprolol, hold spironolactone (diuretics can worsen  hyponatremia) --Monitor BP  #  Shingles, Chronic, Stable: Recent diagnosis. Was seen in ED on 11/12. Sent home with 4 days of prednisone and Valtrex for 7 days. Lesions looked healed and scabbed over.  --Completed steroids --Discontinue valtrex given possible side effect of hyponatremia --Monitor  #Leukocytosis, Acute, Improved: WBC 20.8>17.8.  Most likely 2/2 recent steriod use. No signs of infection currently. She is afebrile and vitals are stable. UA unremarkable. Diff 78% neutrophils, elevated16.3 K/ul neutrophil antibiodies [ref 1.7-7.7], elevated monocytes 1.7 [ref0.1-1.0] --Monitor CBC  #Osteopenia vs Ssteoporosis, Chronic, Stable: --Continue home aldronate and oscal-D  #HLD, Chronic, Stable: Most recent lipid panel 12/2014 with results cholesterol 125, triglycerides 84, HDL 52, LDL 56, VLDL 17.  Patient on pravastatin 40 mg at home. --Continue pravastatin and holding ASA  #Hx CVA, Chronic, Stable: Some residual throat weakness reported however no other focal neurologic deficits.  On admission, neurological exam was normal.  FEN/GI: Regular diet w/ encourage salt intake plus salt tabs, SLIV Prophylaxis: Holding Lovenox SQ, SCDs  Disposition: Pending improvement of hyponatremia and GI evaluation for blood per mouth  Subjective:  Patient complains of mild diffuse abdominal pain when asked. Appears comfortable on exam. Denies chest pain, dyspnea, or nausea. Has blood per mouth but denies pain.   Objective: Temp:  [98.3 F (36.8 C)-98.9 F (37.2 C)] 98.9 F (37.2 C) (11/20 0538) Pulse Rate:  [78-100] 100 (11/20 0538) Resp:  [16-20] 18 (11/20 0538) BP: (118-153)/(57-78) 131/78 (11/20 0538) SpO2:  [94 %-100 %] 100 % (11/20 0625) Weight:  [107 lb (48.5 kg)] 107 lb (48.5 kg) (11/19 1105) Physical Exam: General: NAD, rests comfortably in bed, elderly AA woman, pleasant HEENT: bright red blood per mouth with small clot on suction, no laceration to tongue, no active  hemorrhaging Cardiovascular: RRR, no m/r/g Respiratory: CTA bil, no W/R/R appreciated Gastrointestinal: Soft and mildly diffusely tender, nondistended, +BS, no HSM, no rebound or guarding MSK: moves all extremities equally Derm: right flank and RLQ abdomen with scab lesions in the T10 dermatome Neuro: Alert and oriented to self, 4+ strength in four extremities, CNs grossly intact, nonfocal exam, follows commands  Laboratory: Lactic acid: 1.5 Phos: 2.8 Mag: 2.1 Troponin: 0.03 x3 TSH: 1.520 Cortisol: 5.9 SOsm: 242, UOsm 491 FENa: 0.8% pre-renal UA: small leuks  Recent Labs Lab 10/09/16 0517 10/10/16 0559 10/11/16 0456  WBC 16.5* 20.8* 17.8*  HGB 13.7 13.5 11.0*  HCT 38.1 36.9 30.2*  PLT 328 288 289    Recent Labs Lab 10/09/16 1617 10/10/16 0559 10/11/16 0456  NA 112* 113* 116*  K 4.6 4.2 3.7  CL 74* 78* 82*  CO2 26 22 27   BUN 21* 27* 38*  CREATININE 1.22* 1.14* 1.15*  CALCIUM 8.4* 8.1* 8.2*  GLUCOSE 117* 101* 115*    Imaging/Diagnostic Tests: DG Abd Portable 1V (10/10/2016) FINDINGS: Gas throughout the abdomen including small bowel and colon. No abnormal distention. Vascular calcifications and pelvic phleboliths. Scoliotic curvature of the lumbar spine with multilevel degenerative disc disease. Surgical changes of right hip arthroplasty. No displaced fracture.  IMPRESSION: Nonobstructive bowel gas pattern.  Dg Chest 2 View (10/08/2016) FINDINGS: There is chronic cardiomegaly with calcification and tortuosity of the thoracic aorta. There small chronic bilateral pleural effusions with a nodular appearing density at the right lung base, unchanged since 2016. This may represent loculated fluid. There are no discrete infiltrates. Chronic pleural thickening laterally in the left midzone. Thoracolumbar scoliosis with old compression deformities in the mid upper thoracic spine.  IMPRESSION: No acute changes. Chronic bilateral pleural effusions with chronic nodularity  of the right lung base. Osteopenia with old compression fractures. Aortic atherosclerosis and chronic cardiomegaly.  Ct Head Wo Contrast (10/08/2016) FINDINGS: Brain: No evidence of acute infarction, hemorrhage, hydrocephalus, extra-axial collection or mass lesion/mass effect. There is mild diffuse low-attenuation within the subcortical and periventricular white matter compatible with chronic microvascular disease. Prominence of the sulci and ventricles noted compatible with brain atrophy. Vascular: No hyperdense vessel or unexpected calcification. Skull: Normal. Negative for fracture or focal lesion. Sinuses/Orbits: No acute finding. Other: None.  IMPRESSION: 1. No acute intracranial abnormality. 2. Chronic small vessel ischemic change and brain atrophy.   Wendee Beaversavid J Stormi Vandevelde, DO 10/11/2016, 7:04 AM PGY-1, [facility] FPTS Intern pager: [phone], text pages welcome

## 2016-10-11 NOTE — Significant Event (Signed)
Rapid Response Event Note  Overview: Time Called: 0618 Arrival Time: 0623 Event Type: Other (Comment) (Coughing up blood)  Initial Focused Assessment: Coughing up bright red blood   Interventions: Evaluation  Plan of Care (if not transferred):  Event Summary:  Called to assist with care of patient coughing up bright red blood. Airway is patent with decrease breath sounds. No acute distress noted. Episodes are intermittent requiring occasional suctioning to clear her mouth. On-call provider was made aware of patients complaint. Skin is warm and dry. Heart with regular rate. Abd is soft and slightly tender. Patient appears comfortable at present. We will continue to assist with care.       Beryl MeagerHarbison, [place]

## 2016-10-11 NOTE — Progress Notes (Signed)
Patient seen this morning. Feels like she has something in her throat. She was pointing to her throat. On her bedside is a suction bottle filled with bright red blood. I spoke with the nurse who informed me that this was from suctioning her throat. Patient has hx of abdominal pain.   Plan:  Consult GI stat. Transfer to stepdown unit. Hold all anticoagulants or antiplatelet Consider nephrology consult to help with complicated hyponatremia management.  Dr. Abelardo DieselMcMullen ( Resident in charge) called and all plan was discussed with him. He will follow through with plan.

## 2016-10-11 NOTE — Progress Notes (Signed)
PT Cancellation Note  Patient Details Name: [name] Fry MRN: [id number] DOB: Jul 12, 1920   Cancelled Treatment:     Per chart review noted morning episode of bright red blood per mouth. Patient plans to be transferred to step down and awaiting GI consult.                                            Will hold off Physical Therapy for today.  Brodheadaitlin Medlin, VirginiaPTA [facility] Re[facility] [phone]  Felecia ShellingLori Cynthea Zachman  PTA [facility]  Ac[facility]  Rehab Pager      [phone]

## 2016-10-12 DIAGNOSIS — J90 Pleural effusion, not elsewhere classified: Secondary | ICD-10-CM

## 2016-10-12 DIAGNOSIS — E871 Hypo-osmolality and hyponatremia: Secondary | ICD-10-CM | POA: Diagnosis not present

## 2016-10-12 DIAGNOSIS — Z7189 Other specified counseling: Secondary | ICD-10-CM | POA: Diagnosis not present

## 2016-10-12 DIAGNOSIS — R531 Weakness: Secondary | ICD-10-CM | POA: Diagnosis not present

## 2016-10-12 DIAGNOSIS — Z515 Encounter for palliative care: Secondary | ICD-10-CM | POA: Diagnosis not present

## 2016-10-12 DIAGNOSIS — K922 Gastrointestinal hemorrhage, unspecified: Secondary | ICD-10-CM | POA: Diagnosis not present

## 2016-10-12 DIAGNOSIS — R109 Unspecified abdominal pain: Secondary | ICD-10-CM | POA: Diagnosis not present

## 2016-10-12 LAB — BASIC METABOLIC PANEL
ANION GAP: 7 (ref 5–15)
BUN: 37 mg/dL — ABNORMAL HIGH (ref 6–20)
CHLORIDE: 90 mmol/L — AB (ref 101–111)
CO2: 29 mmol/L (ref 22–32)
Calcium: 8.1 mg/dL — ABNORMAL LOW (ref 8.9–10.3)
Creatinine, Ser: 1.01 mg/dL — ABNORMAL HIGH (ref 0.44–1.00)
GFR calc non Af Amer: 46 mL/min — ABNORMAL LOW (ref >60)
GFR, EST AFRICAN AMERICAN: 53 mL/min — AB (ref >60)
GLUCOSE: 89 mg/dL (ref 65–99)
Potassium: 3.3 mmol/L — ABNORMAL LOW (ref 3.5–5.1)
Sodium: 126 mmol/L — ABNORMAL LOW (ref 135–145)

## 2016-10-12 LAB — CBC
HCT: 26.8 % — ABNORMAL LOW (ref 36.0–46.0)
HEMOGLOBIN: 9.3 g/dL — AB (ref 12.0–15.0)
MCH: 28.4 pg (ref 26.0–34.0)
MCHC: 34.7 g/dL (ref 30.0–36.0)
MCV: 82 fL (ref 78.0–100.0)
Platelets: 254 10*3/uL (ref 150–400)
RBC: 3.27 MIL/uL — AB (ref 3.87–5.11)
RDW: 12.7 % (ref 11.5–15.5)
WBC: 17.5 10*3/uL — ABNORMAL HIGH (ref 4.0–10.5)

## 2016-10-12 LAB — HEMOGLOBIN AND HEMATOCRIT, BLOOD
HCT: 25.8 % — ABNORMAL LOW (ref 36.0–46.0)
HEMATOCRIT: 25.7 % — AB (ref 36.0–46.0)
Hemoglobin: 9 g/dL — ABNORMAL LOW (ref 12.0–15.0)
Hemoglobin: 9.1 g/dL — ABNORMAL LOW (ref 12.0–15.0)

## 2016-10-12 LAB — PROTIME-INR
INR: 1.49
PROTHROMBIN TIME: 18.1 s — AB (ref 11.4–15.2)

## 2016-10-12 LAB — ALBUMIN: ALBUMIN: 2.2 g/dL — AB (ref 3.5–5.0)

## 2016-10-12 LAB — PATHOLOGIST SMEAR REVIEW

## 2016-10-12 MED ORDER — SIMETHICONE 40 MG/0.6ML PO SUSP
40.0000 mg | Freq: Four times a day (QID) | ORAL | Status: DC | PRN
  Administered 2016-10-12: 40 mg via ORAL
  Filled 2016-10-12 (×2): qty 0.6

## 2016-10-12 NOTE — Progress Notes (Signed)
OT Cancellation Note  Patient Details Name: [name] Fry MRN: [id number] DOB: [birth year]   Cancelled Treatment:    Reason Eval/Treat Not Completed: Fatigue/lethargy limiting ability to participate;Pain limiting ability to participate  [facility] Center IncWARD,HILLARY  Tulsi Crossett, OTR/L  [phone] 10/12/2016 10/12/2016, 3:37 PM

## 2016-10-12 NOTE — Progress Notes (Signed)
Recent events noted. Palliative Care note reviewed. Family declined EGD and blood transfusion. Gi will sing off. Please call us back if needed.

## 2016-10-12 NOTE — Progress Notes (Signed)
Spoke w fam, hx of hhc when checking it was [facility] in past. Pt in [place]. Await phy there and occupt there eval. Will assist w hhc if needed.

## 2016-10-12 NOTE — Evaluation (Signed)
Clinical/Bedside Swallow Evaluation Patient Details  Name: [name] Fry MRN: [id number] Date of Birth: 1920/07/11  Today's Date: 10/12/2016 Time: SLP Start Time (ACUTE ONLY): 1513 SLP Stop Time (ACUTE ONLY): 1523 SLP Time Calculation (min) (ACUTE ONLY): 10 min  Past Medical History:  Past Medical History:  Diagnosis Date  . High cholesterol   . Hypertension   . Stroke [facility](HCC)    Past Surgical History:  Past Surgical History:  Procedure Laterality Date  . HERNIA REPAIR    . HIP ARTHROPLASTY Right 07/10/2014   Procedure: ARTHROPLASTY BIPOLAR HIP;  Surgeon: Cheral AlmasNaiping Michael Xu, MD;  [facility];  Service: Orthopedics;  Laterality: Right;   HPI:  82 y.o.femalepresenting with generalized weakness, hyponatremia, encephalopathy. PMH is significant for HTN, HLD, closed right hip fracture, TIA, hx CVA, chronic anemia.  Pt noted with bright blood from mouth; NPO; possible GI bleed.  GI MD following.    Assessment / Plan / Recommendation Clinical Impression  Pt participated in limited assessment of swallow, maintained eyes closed throughout exam and kept hand on her abdomen, with vague complaints about her stomach.  She could not specify her discomfort but affirmed she was uncomfortable. She required max encouragement to consume small amounts of thin liquid, purees, which were consumed without incident or concerns for oropharyngeal deficit.  Exam ceased given pt's clear signs of discomfort.  No further SLP involvement is warranted - will defer to MD for diet advancement.     Aspiration Risk       Diet Recommendation   diet per MD - no oropharyngeal dysphagia       Other  Recommendations Oral Care Recommendations: Oral care QID   Follow up Recommendations None      Frequency and Duration            Prognosis        Swallow Study   General Date of Onset: 10/08/16 HPI: 82 y.o.femalepresenting with generalized weakness, hyponatremia, encephalopathy. PMH is significant for  HTN, HLD, closed right hip fracture, TIA, hx CVA, chronic anemia.  Pt noted with bright blood from mouth; NPO; ? GI bleed.  GI MD following.  Type of Study: Bedside Swallow Evaluation Previous Swallow Assessment: no Diet Prior to this Study: NPO Temperature Spikes Noted: No Respiratory Status: Room air History of Recent Intubation: No Behavior/Cognition: Alert Oral Cavity Assessment: Within Functional Limits Oral Care Completed by SLP: No Oral Cavity - Dentition: Dentures, bottom Self-Feeding Abilities: Needs assist Patient Positioning: Upright in bed Baseline Vocal Quality: Low vocal intensity Volitional Cough: Weak Volitional Swallow: Able to elicit    Oral/Motor/Sensory Function Overall Oral Motor/Sensory Function: Within functional limits   Ice Chips Ice chips: Within functional limits   Thin Liquid Thin Liquid: Within functional limits    Nectar Thick Nectar Thick Liquid: Not tested   Honey Thick Honey Thick Liquid: Not tested   Puree Puree: Within functional limits   Solid   GO   Solid: Not tested        Blenda Mountsouture, Johnie Makki Laurice 10/12/2016,3:34 PM  Marchelle FolksAmanda L. Samson Fredericouture, [place] CCC/SLP Pager [phone]

## 2016-10-12 NOTE — Progress Notes (Signed)
[facility]  [name] Fry 82 y.o. 30-Dec-1919  CC:   ? GI Bleed   Subjective: Patient remains alert but confused. No family at bedside. Discussed with the nursing staff. No evidence of active bleeding since last night. No bowel movement. Patient had some coughing/choking during swallowing medications  yesterday so she has been made nothing by mouth.   ROS : Not able to obtain   Objective: Vital signs in last 24 hours: Vitals:   10/12/16 0600 10/12/16 0737  BP: 132/66 125/70  Pulse: 95 91  Resp: (!) 8 16  Temp:  98.5 F (36.9 C)    Physical Exam:  General:   Alert,  Somewhat confused cooperative Oral mucosa without any active bleeding. No ulcers seen in the oral cavity. Lungs:  Left basilar  crackles noted.  No acute distress. Heart:  Regular rate and rhythm; no murmurs, clicks, rubs,  or gallops. Abdomen:  Soft, NT, ND, BS +  LE: trace edema     Lab Results:  Recent Labs  10/11/16 0456 10/12/16 0228  NA 116* 126*  K 3.7 3.3*  CL 82* 90*  CO2 27 29  GLUCOSE 115* 89  BUN 38* 37*  CREATININE 1.15* 1.01*  CALCIUM 8.2* 8.1*   No results for input(s): AST, ALT, ALKPHOS, BILITOT, PROT, ALBUMIN in the last 72 hours.  Recent Labs  10/10/16 0559 10/11/16 0456  10/11/16 2154 10/12/16 0228  WBC 20.8* 17.8*  --   --  17.5*  NEUTROABS 16.3*  --   --   --   --   HGB 13.5 11.0*  < > 9.8* 9.3*  HCT 36.9 30.2*  < > 27.8* 26.8*  MCV 79.7 80.1  --   --  82.0  PLT 288 289  --   --  254  < > = values in this interval not displayed. No results for input(s): LABPROT, INR in the last 72 hours.    Assessment/Plan: - Bright blood around the mouth. No vomiting. No black stool. No bowel movement - Acute drop in hemoglobin.  - hyponatremia - Altered mental status - Leukocytosis  Recommendations ------------------------- - Not able to rule out upper GI bleed completely. Patient has mildly elevated BUN. Ongoing Drop in hemoglobin also noted. -  Family not at bedside at this time. She may need endoscopy if ongoing evidence of drop in hemoglobin. Patient did not had any bowel movement today. No evidence of active bleeding since last night. - Risk and benefits of upper endoscopy discussed with the granddaughter. She will discuss with other family members. - Protonix drip for now. - Recommend speech therapy evaluation.she may need ENT evaluation - Keep NPO except for meds - Monitor H&H. GI will follow   Kathi DerParag Red Mandt MD, FACP 10/12/2016, 8:56 AM  Pager [phone]  If no answer or after 5 PM call [phone]

## 2016-10-12 NOTE — Progress Notes (Signed)
PT Cancellation Note  Patient Details Name: Barkley Brunslice E Matsunaga MRN: [id number] DOB: 1920/06/03   Cancelled Treatment:    Reason Eval/Treat Not Completed: Pain limiting ability to participate;Fatigue/lethargy limiting ability to participate   Fabio AsaDevon J Safiyyah Vasconez 10/12/2016, 4:45 PM Charlotte Crumbevon Theophile Harvie, PT DPT  [phone]

## 2016-10-12 NOTE — Significant Event (Signed)
Discussed with family and HCPOA (daughter, Dedra SkeensGwen) concerning plan of care. Family agrees to continue treating the hyponatremia and providing medical care w/o pursing invasive procedures or working the patient up for new CXR lung nodule findings. Also discussed palliative weighing in on the patients condition and family were in agreement.  -- Durward Parcelavid McMullen, DO [facility], PGY-1

## 2016-10-12 NOTE — Progress Notes (Signed)
   10/12/16 0945  Clinical Encounter Type  Visited With Patient  Visit Type Initial  Stress Factors  Patient Stress Factors None identified  Pt. Was resting and asleep, Chaplain was rounding and checking on new patients to introduce pastoral services and availability.  Spoke with Grand-daughter about Chaplain services is needed for further assistance.  No assessments.  [employer] Chaplain  Eliu Batch  [phone]

## 2016-10-12 NOTE — Significant Event (Signed)
Discussed low Hgb from suspected upper GI bleed. GI rec possible EGD. Discussed this with granddaughter, Dois DavenportSandra. She understands the situation. Family meeting at 1430. Plan to discuss plan of care. -- Durward Parcelavid Vedanth Sirico, DO [facility], PGY-1

## 2016-10-12 NOTE — Progress Notes (Signed)
Initial Nutrition Assessment  DOCUMENTATION CODES:   Underweight  INTERVENTION:   -Continue Ensure Enlive po BID, each supplement provides 350 kcal and 20 grams of protein  NUTRITION DIAGNOSIS:   Predicted suboptimal nutrient intake related to lethargy/confusion as evidenced by  (underweight status, suspected PCM).  GOAL:   Patient will meet greater than or equal to 90% of their needs  MONITOR:   PO intake, Supplement acceptance, Labs, Weight trends, Skin, I & O's  REASON FOR ASSESSMENT:   Malnutrition Screening Tool    ASSESSMENT:   Barkley Brunslice E Backstrom is a 82 y.o. female presenting with generalized weakness. PMH is significant for HTN, HLD, closed right hip fracture, TIA, hx CVA, chronic anemia.   Pt admitted with hyponatremia and weakness. Pt was transferred to SDU on 10/11/16 due to GIB.   SLP completed BSE; recommended resume diet per MD. Pt lethargic, but no concerns with aspiration with PO intake.   Pt very lethargic at this time visit. Pt greeted this RD when name was called, however, did not participate in interview further. No family members at bedside to provide additional hx.   Wt hx revealed wt fluctuations, no recent wt to assess weight loss.   Nutrition-Focused physical exam completed. Findings are mild to moderate fat depletion, mild to moderate muscle depletion, and no edema.   Palliative care consult has been completed; plan for no invasive procedures, including EGD, with no further escalation of care.   Given underweight status, suspect poor oral intake PTA, and advanced age, this RD suspects malnutrition at this time, however, unable to identify given limited information available. RD will continue Ensure supplements to optimize oral intake.   Labs reviewed: Na: 126, K: 3.3.   Diet Order:  DIET SOFT Room service appropriate? Yes; Fluid consistency: Thin  Skin:  Reviewed, no issues  Last BM:  10/07/16  Height:   Ht Readings from Last 1 Encounters:   10/11/16 5\' 3"  (1.6 m)    Weight:   Wt Readings from Last 1 Encounters:  10/11/16 101 lb 12.8 oz (46.2 kg)    Ideal Body Weight:  52.3 kg  BMI:  Body mass index is 18.03 kg/m.  Estimated Nutritional Needs:   Kcal:  1200-1400  Protein:  55-65 grams  Fluid:  1.2-1.4 L  EDUCATION NEEDS:   No education needs identified at this time  Carlton Buskey A. Mayford KnifeWilliams, RD, LDN, CDE Pager: [phone] After hours Pager: [phone]

## 2016-10-12 NOTE — Discharge Summary (Signed)
error 

## 2016-10-12 NOTE — Consult Note (Signed)
Consultation Note Date: 10/12/2016   Patient Name: [name] Fry  DOB: [birth year]  MRN: [id number]  Age / Sex: 82 y.o., female  PCP: Leonides Sake, MD Referring Physician: Lind Covert, MD  Reason for Consultation: Establishing goals of care and Non pain symptom management  HPI/Patient Profile: 82 y.o. female  with past medical history of HTN, HLD, TIA, CVA, and chronic anemia who was admitted on 10/08/2016 with weakness and progressive confusion. Lab work on admission showed severed hyponatremia (Na 113). As this was being evaluated she then developed bleeding from her mouth. While only one episode, she had subsequent drop in her Hgb/Hct.   Clinical Assessment and Goals of Care: I met with [name] Fry's family today, with [name] Kaufman NP (also of Palliative Care). I introduced our service, specifically explaining that Palliative Care is focused on helping alleviate the symptoms and stress associated with serious illness, and assisting the patient and family in clarifying goals of care and making decisions in complex medical situations.   During our meeting today we explored [name] Fry's health status prior to hospitalization, her current presentation, and goals moving forward. Prior to hospitalization [name] Fry lives with family members and was predominantly independent in her ADLs. She relied on family for transportation, medication management, and food preparation, but was otherwise active in her home and church community. After hospitalization, her family expressed a clear understanding of the main issues being managed: confusion/weakness, bleeding, and hyponatremia. I explained each of these issues in terms of what work-up had been done, presumed cause, and ongoing management strategies. We then explored their goals of care, and expectations moving forward.   During our conversation all members felt  that maintaining [name] Fry's comfort and dignity took priority over life prolonging and potentially harmful interventions. To that end, we discussed code status and everyone was in agreement that CPR, defibrillation, and intubation would cause more harm than benefit. Consequently, [name] Fry was changed to DNR. Furthermore, we explored the possibility that her bleeding would continue or worsen. At this juncture, they would not want to purse an EGD because of the associated risks and would prefer aggressive medication management. We clarified that, if she were to continue to bleed, they would not want a blood transfusion as this would prolong her life but not change the outcome.  Primary Decision Maker OTHER; family makes decisions cooperatively.   SUMMARY OF RECOMMENDATIONS   -DNR -If she continues to bleed, continue aggressive medication management, however they do not want to pursue an EGD or blood transfusions -Continue current level of care but do not escalate  Code Status/Advance Care Planning:  DNR  Symptom Management:   Gas pain: Start scheduled simethicone  Psycho-social/Spiritual:   Desire for further Chaplaincy support:no  Additional Recommendations: TBD  Prognosis:   Unable to determine  Discharge Planning: To Be Determined      Primary Diagnoses: Present on Admission: . Hyponatremia . Acute hyponatremia   I have reviewed the medical record, interviewed the patient and family, and examined the  patient. The following aspects are pertinent.  Past Medical History:  Diagnosis Date  . High cholesterol   . Hypertension   . Stroke [facility])    Social History   Social History  . Marital status: Widowed    Spouse name: N/A  . Number of children: N/A  . Years of education: N/A   Social History Main Topics  . Smoking status: Never Smoker  . Smokeless tobacco: None  . Alcohol use No  . Drug use: No  . Sexual activity: Not Asked   Other Topics Concern  .  None   Social History Narrative  . None   Family History  Problem Relation Age of Onset  . Dementia Other   . CAD Neg Hx    Scheduled Meds: . calcium-vitamin D  1 tablet Oral TID WC  . docusate sodium  200 mg Oral Daily  . feeding supplement (ENSURE ENLIVE)  237 mL Oral BID BM  . hydrALAZINE  75 mg Oral TID  . [START ON 10/14/2016] pantoprazole  40 mg Intravenous Q12H  . pravastatin  40 mg Oral Daily  . sodium chloride flush  3 mL Intravenous Q12H  . sodium chloride flush  3 mL Intravenous Q12H  . sodium chloride  1 g Oral TID WC   Continuous Infusions: . pantoprozole (PROTONIX) infusion 8 mg/hr (10/12/16 0600)   PRN Meds:.sodium chloride, acetaminophen, Glycerin (Adult), hydrALAZINE, ondansetron (ZOFRAN) IV, polyethylene glycol, simethicone, sodium chloride flush No Known Allergies   Review of Systems: Limited by confusion; pt endorses abdominal discomfort and passing gas frequently  Physical Exam  Constitutional: No distress.  Thin and frail  HENT:  Head: Normocephalic and atraumatic.  Eyes: EOM are normal.  Neck: Normal range of motion.  Pulmonary/Chest: Effort normal.  Abdominal: Soft. She exhibits no distension. There is no tenderness.  Neurological:  Oriented to person and place only  Skin: Skin is warm. There is pallor.  Psychiatric: Her speech is delayed. She is slowed and withdrawn. Cognition and memory are impaired.    Vital Signs: BP 131/65 (BP Location: Right Arm)   Pulse 93   Temp 98.8 F (37.1 C) (Oral)   Resp (!) 24   Ht '5\' 3"'  (1.6 m)   Wt 46.2 kg (101 lb 12.8 oz)   SpO2 100%   BMI 18.03 kg/m  Pain Assessment: No/denies pain POSS *See Group Information*: 1-Acceptable,Awake and alert Pain Score: 0-No pain   SpO2: SpO2: 100 % O2 Device:SpO2: 100 % O2 Flow Rate: .   IO: Intake/output summary:  Intake/Output Summary (Last 24 hours) at 10/12/16 1519 Last data filed at 10/12/16 1200  Gross per 24 hour  Intake              525 ml  Output                 0 ml  Net              525 ml    LBM: Last BM Date: 10/07/16 Baseline Weight: Weight: 48.5 kg (107 lb) Most recent weight: Weight: 46.2 kg (101 lb 12.8 oz)     Palliative Assessment/Data:  PPS 60% PTA   Flowsheet Rows   Flowsheet Row Most Recent Value  Intake Tab  Referral Department  -- [internal medicine]  Unit at Time of Referral  ICU  Palliative Care Primary Diagnosis  Cardiac  Date Notified  10/11/16  Palliative Care Type  New Palliative care  Reason for referral  Clarify Goals of  Care  Date of Admission  10/08/16  # of days IP prior to Palliative referral  3  Clinical Assessment  Psychosocial & Spiritual Assessment  Palliative Care Outcomes      Time In: 1430 Time Out: 1540 Time Total: 70 minutes Greater than 50%  of this time was spent counseling and coordinating care related to the above assessment and plan.  Signed  Charlynn Court, NP Palliative Medicine Team Team Phone # [phone] (Nights/Weekends)

## 2016-10-12 NOTE — Progress Notes (Signed)
Family Medicine Teaching Service Daily Progress Note Intern Pager: [phone]  Patient name: [name] Fry Medical record number: [id number] Date of birth: 10-09-1920 Age: 82 y.o. Gender: female  Primary Care Provider: Ailene Ravel, MD Consultants: Gastroenterology Code Status: Full  Pt Overview and Major Events to Date:  11/17: Admitted with hyponatremia and weakness 11/20: Pt spitting up blood with stable vitals and physical exam, transferred to SDU  Assessment and Plan: [name] Fry is a 82 y.o. female presenting with generalized weakness. PMH is significant for HTN, HLD, closed right hip fracture, TIA, hx CVA, chronic anemia.   #Severe Hyponatremia, Chronic, Improving: Chronic in nature according to family. PCP is working this up. Unclear etiology at this time but most likely SIADH vs cancinoma. She meets SIADH criteria with low serum osmolality and elevated urine sodium, however does not appear to have concentrated urine. Have ruled out hypothyroidism and hypoandrelenism. Euvolemic on exam. Most common cause of SIADH is small cell lung carcinoma. CXR with some chronic nodularity findings; no new masses. Iatrogenic causes: patient is on spironolactone which is a diuretic and has been associated with fluid/electrolyte abnormalities such as hyponatremia; holding. Hyperglycemia ruled out with wnl blood sugars. Glucocorticoid deficiency r/o cortisol level normal. Renal function good and not apparent cause for hyponatremia. Sodium improved to 126 this AM. Alert to self only this AM. Valtrex for shingles d/c'd 11/20 as hyponatremia is side effect.  --Monitor for symptoms of hyponatremia --Telemetry --SLIV, no free water, fluid restrict to <1000 ml daily --Encourage salt with meals, salt tablets given but poorly tolerated (patient refuses) --Serial BMPs until asymptomatic and improved --Pt not taken sodium chloride tabs TID since 11/18 --May need to consider nephrology consult to help with  correction if worsens  --May need GOC discussion down the line --Palliative consult placed, to see pt  #Bright Red Blood Per Mouth, Acute, Resolved: Bright red blood power mouth with small clot 11/20. No hemorrhaging on evaluation. Tongue atraumatic. Vitals stable and pt denied pain. No witnessed seizures and difficult to evaluate if incontinent given baseline incontinence. Has sever hyponatremia so seizure is possible. No blood per nares on exam. No bright red blood per mouth since initial episode 11/20. --Consult GI, appreciate recs, low Hgb, will consider EGD if Hgb worsens or pts becomes symptomatic, remains NPO, also rec SLP evaluation --SLP consult pending --Holding ASA and Lovenox SQ --Consider abdominal CT if abd pain worsens --Hgb dropped from 13.5>9.0 over 2 days, will recheck H/H this afternoon  #Abdominal Pain, Possibly 2/2 Constipation, Improved:   Diffuse abdominal pain, one hard stool 11/18 with multiple episodes of emesis 11/19 per family at bedside. ABX w/ gas throughout the abdomen including small bowel and colon w/o distention w/ non-obstructive pattern. --Colace to 200 mg QD --Miralax 17g BID  #EKG Changes, Chronic, Stable:  Appears chronic in nature without any significant changes from 2015 EKG. Has ST depression in leads II, III, AVF, V4, V5. Also with T-wave inversion in II and III. RBBB. Troponins all negative. Denies chest pain. No concern for ACS at this time. Repeat EKG yesterday AM with same findings.   #Generalized Weakness, Possible Encephalopathy, Improved Likely 2/2 to hyponatremia. At baseline patient able to ambulate and perform ADLs. Weakness is a symptom of hyponatremia. Strength on admission 4/4. Head CT negative for acute abnormality. A&O to self. --Monitor electrolytes; replace as needed --Monitor neurological exam --PT/OT rec HHPT, supervision for mobility, 24 hr assistance  #Hypertension, Chronic, Stable: BP 125/70 this AM. Elevated on admission  w/o taking afternoon meds.  At home, controlled with hydralazine 75 mg TID, spironolactone 50 mg qd, metoprolol 200 mg qd.  --Continue home hydralazine and metoprolol, hold spironolactone (diuretics can worsen hyponatremia) --Monitor BP  #Shingles, Chronic, Stable: Recent diagnosis. Was seen in ED on 11/12. Sent home with 4 days of prednisone and Valtrex for 7 days. Lesions looked healed and scabbed over. Completed steroids. Discontinued valtrex given possible side effect of hyponatremia. --Monitor  #Leukocytosis, Acute, Improved: WBC 17.8>17.5.  Most likely 2/2 recent steriod use. No signs of infection currently. She is afebrile and vitals are stable. UA unremarkable. Diff 78% neutrophils, elevated16.3 K/ul neutrophil antibiodies [ref 1.7-7.7], elevated monocytes 1.7 [ref0.1-1.0] --Monitor CBC  #Osteopenia vs Ssteoporosis, Chronic, Stable: --Continue home aldronate and oscal-D  #HLD, Chronic, Stable: Most recent lipid panel 12/2014 with results cholesterol 125, triglycerides 84, HDL 52, LDL 56, VLDL 17.  Patient on pravastatin 40 mg at home. --Continue pravastatin and holding ASA  #Hx CVA, Chronic, Stable: Some residual throat weakness reported however no other focal neurologic deficits.  On admission, neurological exam was normal.  FEN/GI: Regular diet w/ encourage salt intake plus salt tabs, SLIV Prophylaxis: Holding Lovenox SQ, SCDs  Disposition: Pending palliative consult and PT/OT recs  Subjective:  No overnight events. Patient comfortable in bed. Denies bleeding from mouth since initial episode 11/20. No complaints this AM. Denies dyspnea, CP, abdominal pain, nausea, vomiting.  Objective: Temp:  [97.9 F (36.6 C)-98.5 F (36.9 C)] 98.5 F (36.9 C) (11/21 0737) Pulse Rate:  [81-107] 91 (11/21 0737) Resp:  [0-21] 16 (11/21 0737) BP: (98-150)/(50-78) 125/70 (11/21 0737) SpO2:  [97 %-100 %] 100 % (11/21 0737) Weight:  [101 lb 12.8 oz (46.2 kg)] 101 lb 12.8 oz (46.2 kg)  (11/20 1037) Physical Exam: General: NAD, rests comfortably in bed, elderly AA woman, pleasant HEENT: clear pharynx w/o blood, no laceration to tongue Cardiovascular: RRR, no m/r/g Respiratory: CTA bil, no W/R/R appreciated Gastrointestinal: Soft and mildly diffusely tender, nondistended, +BS, no HSM, no rebound or guarding MSK: moves all extremities equally Derm: right flank and RLQ abdomen with scab lesions in the T10 dermatome Neuro: Alert and oriented to self, 4+ strength in four extremities, CNs grossly intact, nonfocal exam, follows commands  Laboratory: Lactic acid: 1.5 Phos: 2.8 Mag: 2.1 Troponin: 0.03 x3 TSH: 1.520 Cortisol: 5.9 SOsm: 242, UOsm 491 FENa: 0.8% pre-renal UA: small leuks  Recent Labs Lab 10/10/16 0559 10/11/16 0456 10/11/16 1116 10/11/16 2154 10/12/16 0228  WBC 20.8* 17.8*  --   --  17.5*  HGB 13.5 11.0* 10.5* 9.8* 9.3*  HCT 36.9 30.2* 29.4* 27.8* 26.8*  PLT 288 289  --   --  254    Recent Labs Lab 10/10/16 0559 10/11/16 0456 10/12/16 0228  NA 113* 116* 126*  K 4.2 3.7 3.3*  CL 78* 82* 90*  CO2 22 27 29   BUN 27* 38* 37*  CREATININE 1.14* 1.15* 1.01*  CALCIUM 8.1* 8.2* 8.1*  GLUCOSE 101* 115* 89    Imaging/Diagnostic Tests: DG Abd Portable 1V (10/10/2016) FINDINGS: Gas throughout the abdomen including small bowel and colon. No abnormal distention. Vascular calcifications and pelvic phleboliths. Scoliotic curvature of the lumbar spine with multilevel degenerative disc disease. Surgical changes of right hip arthroplasty. No displaced fracture.  IMPRESSION: Nonobstructive bowel gas pattern.  Dg Chest 2 View (10/08/2016) FINDINGS: There is chronic cardiomegaly with calcification and tortuosity of the thoracic aorta. There small chronic bilateral pleural effusions with a nodular appearing density at the right lung base, unchanged  since 2016. This may represent loculated fluid. There are no discrete infiltrates. Chronic pleural thickening  laterally in the left midzone. Thoracolumbar scoliosis with old compression deformities in the mid upper thoracic spine.  IMPRESSION: No acute changes. Chronic bilateral pleural effusions with chronic nodularity of the right lung base. Osteopenia with old compression fractures. Aortic atherosclerosis and chronic cardiomegaly.  Ct Head Wo Contrast (10/08/2016) FINDINGS: Brain: No evidence of acute infarction, hemorrhage, hydrocephalus, extra-axial collection or mass lesion/mass effect. There is mild diffuse low-attenuation within the subcortical and periventricular white matter compatible with chronic microvascular disease. Prominence of the sulci and ventricles noted compatible with brain atrophy. Vascular: No hyperdense vessel or unexpected calcification. Skull: Normal. Negative for fracture or focal lesion. Sinuses/Orbits: No acute finding. Other: None.  IMPRESSION: 1. No acute intracranial abnormality. 2. Chronic small vessel ischemic change and brain atrophy.   Wendee Beaversavid J Bain Whichard, DO 10/12/2016, 9:17 AM PGY-1, [place] [facility] FPTS Intern pager: [phone], text pages welcome

## 2016-10-13 ENCOUNTER — Encounter (HOSPITAL_COMMUNITY): Payer: Self-pay | Admitting: General Practice

## 2016-10-13 DIAGNOSIS — R109 Unspecified abdominal pain: Secondary | ICD-10-CM | POA: Diagnosis not present

## 2016-10-13 DIAGNOSIS — K922 Gastrointestinal hemorrhage, unspecified: Secondary | ICD-10-CM | POA: Diagnosis not present

## 2016-10-13 DIAGNOSIS — E871 Hypo-osmolality and hyponatremia: Secondary | ICD-10-CM | POA: Diagnosis not present

## 2016-10-13 DIAGNOSIS — J90 Pleural effusion, not elsewhere classified: Secondary | ICD-10-CM | POA: Diagnosis not present

## 2016-10-13 DIAGNOSIS — R531 Weakness: Secondary | ICD-10-CM | POA: Diagnosis not present

## 2016-10-13 LAB — BASIC METABOLIC PANEL
ANION GAP: 9 (ref 5–15)
BUN: 18 mg/dL (ref 6–20)
CHLORIDE: 96 mmol/L — AB (ref 101–111)
CO2: 27 mmol/L (ref 22–32)
Calcium: 8.2 mg/dL — ABNORMAL LOW (ref 8.9–10.3)
Creatinine, Ser: 0.84 mg/dL (ref 0.44–1.00)
GFR calc Af Amer: >60 mL/min (ref >60)
GFR, EST NON AFRICAN AMERICAN: 58 mL/min — AB (ref >60)
Glucose, Bld: 84 mg/dL (ref 65–99)
POTASSIUM: 3.2 mmol/L — AB (ref 3.5–5.1)
SODIUM: 132 mmol/L — AB (ref 135–145)

## 2016-10-13 LAB — CBC
HCT: 27.6 % — ABNORMAL LOW (ref 36.0–46.0)
HEMOGLOBIN: 9.5 g/dL — AB (ref 12.0–15.0)
MCH: 28.9 pg (ref 26.0–34.0)
MCHC: 34.4 g/dL (ref 30.0–36.0)
MCV: 83.9 fL (ref 78.0–100.0)
PLATELETS: 266 10*3/uL (ref 150–400)
RBC: 3.29 MIL/uL — AB (ref 3.87–5.11)
RDW: 13 % (ref 11.5–15.5)
WBC: 9.3 10*3/uL (ref 4.0–10.5)

## 2016-10-13 MED ORDER — TRAZODONE HCL 50 MG PO TABS
50.0000 mg | ORAL_TABLET | Freq: Every day | ORAL | Status: DC
  Administered 2016-10-13 – 2016-10-17 (×5): 50 mg via ORAL
  Filled 2016-10-13 (×6): qty 1

## 2016-10-13 MED ORDER — PANTOPRAZOLE SODIUM 40 MG PO TBEC: 40.0000 mg | DELAYED_RELEASE_TABLET | Freq: Two times a day (BID) | ORAL | 0 refills | Status: DC

## 2016-10-13 MED ORDER — POTASSIUM CHLORIDE CRYS ER 20 MEQ PO TBCR
40.0000 meq | EXTENDED_RELEASE_TABLET | Freq: Once | ORAL | Status: AC
Start: 2016-10-13 — End: 2016-10-13
  Administered 2016-10-13: 40 meq via ORAL
  Filled 2016-10-13: qty 2

## 2016-10-13 MED ORDER — PANTOPRAZOLE SODIUM 40 MG PO TBEC
40.0000 mg | DELAYED_RELEASE_TABLET | Freq: Two times a day (BID) | ORAL | Status: DC
  Administered 2016-10-13 – 2016-10-14 (×4): 40 mg via ORAL
  Filled 2016-10-13 (×4): qty 1

## 2016-10-13 NOTE — Progress Notes (Addendum)
[address] [place]   ocup there went in to work with pt prior to leaving and grandson present. Pt over last couple of days has become very debilitated. occup there spoke w da's who are also elderly and they now feel with pt being so debilitated that they need snf. Spoke w sw nadia and made sw ref for snf. nse to call md's to let them know pt cannot go home.

## 2016-10-13 NOTE — Progress Notes (Signed)
Family Medicine Teaching Service Daily Progress Note Intern Pager: [phone]  Patient name: [name] Fry Medical record number: [id number] Date of birth: 20-Nov-1920 Age: 82 y.o. Gender: female  Primary Care Provider: [name],[name], MD Consultants: Gastroenterology, Palliative Care Code Status: DNR (as of 10/12/2016, per palliative)  Pt Overview and Major Events to Date:  11/17: Admitted with hyponatremia and weakness 11/20: Pt spitting up blood with stable vitals and physical exam, transferred to SDU 11/21: Palliative meeting w/ family decided med mgmt only, no invasive studies or procedures  Assessment and Plan: [name] Fry is a 82 y.o. female presenting with generalized weakness. PMH is significant for HTN, HLD, closed right hip fracture, TIA, hx CVA, chronic anemia.   #Severe Hyponatremia, Chronic, Improving: Chronic in nature according to family. PCP is working this up. Unclear etiology at this time but most likely SIADH vs cancinoma. She meets SIADH criteria with low serum osmolality and elevated urine sodium, however does not appear to have concentrated urine. Have ruled out hypothyroidism and hypoandrelenism. Euvolemic on exam. Most common cause of SIADH is small cell lung carcinoma. CXR with some chronic nodularity findings; no new masses. Iatrogenic causes: patient is on spironolactone which is a diuretic and has been associated with fluid/electrolyte abnormalities such as hyponatremia; holding. Hyperglycemia ruled out with wnl blood sugars. Glucocorticoid deficiency r/o cortisol level normal. Renal function good and not apparent cause for hyponatremia. Valtrex for shingles d/c'd 11/20 as hyponatremia is side effect. Sodium improved to 132 this AM. Alert to self only this AM. Palliative meeting w/ family 11/21 determined med mgmt only, no EGD/invasive invasive studies or procedures. --Monitor for symptoms of hyponatremia --Telemetry --SLIV, no free water, fluid restrict to <1000  ml daily --Encourage salt with meals, salt tablets given but poorly tolerated (patient refuses) --Serial BMPs until asymptomatic and improved  #Bright Red Blood Per Mouth, Acute, Resolved: Bright red blood power mouth with small clot 11/20. No hemorrhaging on evaluation. Tongue atraumatic. Vitals stable and pt denied pain. No witnessed seizures and difficult to evaluate if incontinent given baseline incontinence. Has sever hyponatremia so seizure is possible. No blood per nares on exam. No bright red blood per mouth since initial episode 11/20. Hgb improved 9.5. SLP eval shows no dysphagia, but uncomfortable with fluids. Soft diet on d/c. --Consult GI, appreciate recs, will sign off given palliative decisions for no EGD or transfusion --Holding ASA and Lovenox SQ --Consider abdominal CT if abd pain worsens  #Abdominal Pain, Possibly 2/2 Constipation, Improved:   Diffuse abdominal pain, one hard stool 11/18 with multiple episodes of emesis 11/19 per family at bedside. ABX w/ gas throughout the abdomen including small bowel and colon w/o distention w/ non-obstructive pattern. --Colace to 200 mg QD --Miralax 17g BID  #EKG Changes, Chronic, Stable:  Appears chronic in nature without any significant changes from 2015 EKG. Has ST depression in leads II, III, AVF, V4, V5. Also with T-wave inversion in II and III. RBBB. Troponins all negative. Denies chest pain. No concern for ACS at this time. Repeat EKG yesterday AM with same findings.   #Generalized Weakness, Possible Encephalopathy, Improved Likely 2/2 to hyponatremia. At baseline patient able to ambulate and perform ADLs. Weakness is a symptom of hyponatremia. Strength on admission 4/4. Head CT negative for acute abnormality. A&O to self. PT/OT rec HHPT, supervision for mobility, 24 hr assistance. --Monitor electrolytes; replace as needed --Monitor neurological exam  #Hypertension, Chronic, Stable: BP 120/62 this AM. Elevated on admission w/o  taking afternoon meds.  At  home, controlled with hydralazine 75 mg TID, spironolactone 50 mg qd, metoprolol 200 mg qd.  --Continue home hydralazine and hold metoprolol, hold spironolactone (diuretics can worsen hyponatremia) --Monitor BP  #Shingles, Chronic, Stable: Recent diagnosis. Was seen in ED on 11/12. Sent home with 4 days of prednisone and Valtrex for 7 days. Lesions looked healed and scabbed over. Completed steroids. Discontinued valtrex given possible side effect of hyponatremia. --Monitor  #Leukocytosis, Acute, Resolved: WBC 9.3 this AM.  Most likely 2/2 recent steriod use. No signs of infection currently. She is afebrile and vitals are stable. UA unremarkable. Diff 78% neutrophils, elevated16.3 K/ul neutrophil antibiodies [ref 1.7-7.7], elevated monocytes 1.7 [ref0.1-1.0] --Monitor CBC  #Osteopenia vs Ssteoporosis, Chronic, Stable: --Hold home aldronate and continue oscal-D  #HLD, Chronic, Stable: Most recent lipid panel 12/2014 with results cholesterol 125, triglycerides 84, HDL 52, LDL 56, VLDL 17.  Patient on pravastatin 40 mg at home. --Continue pravastatin and holding ASA  #Hx CVA, Chronic, Stable: Some residual throat weakness reported however no other focal neurologic deficits.  On admission, neurological exam was normal.  FEN/GI: Regular diet w/ encourage salt intake plus salt tabs, SLIV Prophylaxis: Holding Lovenox SQ, SCDs  Disposition: HH PT/OT, anticipate d/c home today.  Subjective:  No overnight events. Patient comfortable in bed. Denies bleeding from mouth since initial episode 11/20. No complaints this AM. Denies dyspnea, CP, abdominal pain, nausea, vomiting.  Objective: Temp:  [98.4 F (36.9 C)-98.8 F (37.1 C)] 98.6 F (37 C) (11/22 0740) Pulse Rate:  [80-102] 89 (11/22 0740) Resp:  [0-24] 15 (11/22 0740) BP: (96-149)/(55-77) 120/62 (11/22 0740) SpO2:  [98 %-100 %] 100 % (11/22 0740) Physical Exam: General: NAD, rests comfortably in bed,  elderly AA woman, pleasant HEENT: clear pharynx w/o blood, no laceration to tongue Cardiovascular: RRR, no m/r/g Respiratory: CTA bil, no W/R/R appreciated Gastrointestinal: Soft and mildly diffusely tender, nondistended, +BS, no HSM, no rebound or guarding MSK: moves all extremities equally Derm: right flank and RLQ abdomen with scab lesions in the T10 dermatome Neuro: Alert and oriented to self, 4+ strength in four extremities, CNs grossly intact, nonfocal exam, follows commands  Laboratory: Lactic acid: 1.5 Phos: 2.8 Mag: 2.1 Troponin: 0.03 x3 TSH: 1.520 Cortisol: 5.9 SOsm: 242, UOsm 491 FENa: 0.8% pre-renal UA: small leuks  Recent Labs Lab 10/11/16 0456  10/12/16 0228 10/12/16 0946 10/12/16 2204 10/13/16 0311  WBC 17.8*  --  17.5*  --   --  9.3  HGB 11.0*  < > 9.3* 9.0* 9.1* 9.5*  HCT 30.2*  < > 26.8* 25.8* 25.7* 27.6*  PLT 289  --  254  --   --  266  < > = values in this interval not displayed.  Recent Labs Lab 10/11/16 0456 10/12/16 0228 10/13/16 0311  NA 116* 126* 132*  K 3.7 3.3* 3.2*  CL 82* 90* 96*  CO2 27 29 27   BUN 38* 37* 18  CREATININE 1.15* 1.01* 0.84  CALCIUM 8.2* 8.1* 8.2*  GLUCOSE 115* 89 84    Imaging/Diagnostic Tests: DG Abd Portable 1V (10/10/2016) FINDINGS: Gas throughout the abdomen including small bowel and colon. No abnormal distention. Vascular calcifications and pelvic phleboliths. Scoliotic curvature of the lumbar spine with multilevel degenerative disc disease. Surgical changes of right hip arthroplasty. No displaced fracture.  IMPRESSION: Nonobstructive bowel gas pattern.  Dg Chest 2 View (10/08/2016) FINDINGS: There is chronic cardiomegaly with calcification and tortuosity of the thoracic aorta. There small chronic bilateral pleural effusions with a nodular appearing density at the right  lung base, unchanged since 2016. This may represent loculated fluid. There are no discrete infiltrates. Chronic pleural thickening laterally  in the left midzone. Thoracolumbar scoliosis with old compression deformities in the mid upper thoracic spine.  IMPRESSION: No acute changes. Chronic bilateral pleural effusions with chronic nodularity of the right lung base. Osteopenia with old compression fractures. Aortic atherosclerosis and chronic cardiomegaly.  Ct Head Wo Contrast (10/08/2016) FINDINGS: Brain: No evidence of acute infarction, hemorrhage, hydrocephalus, extra-axial collection or mass lesion/mass effect. There is mild diffuse low-attenuation within the subcortical and periventricular white matter compatible with chronic microvascular disease. Prominence of the sulci and ventricles noted compatible with brain atrophy. Vascular: No hyperdense vessel or unexpected calcification. Skull: Normal. Negative for fracture or focal lesion. Sinuses/Orbits: No acute finding. Other: None.  IMPRESSION: 1. No acute intracranial abnormality. 2. Chronic small vessel ischemic change and brain atrophy.     Wendee Beavers, DO 10/13/2016, 9:14 AM PGY-1, [place] [facility] FPTS Intern pager: [phone], text pages welcome

## 2016-10-13 NOTE — Progress Notes (Signed)
PT Cancellation Note  Patient Details Name: [name] Fry MRN: [id number] DOB: 07/13/1920   Cancelled Treatment:    Reason Eval/Treat Not Completed: Fatigue/lethargy limiting ability to participate. PT will f/u with pt as appropriate.   Alessandra BevelsJennifer M Tamara Monteith 10/13/2016, 9:42 AM Deborah ChalkJennifer Yishai Rehfeld, PT, DPT [phone]

## 2016-10-13 NOTE — Progress Notes (Signed)
CSW received referral that pt family now wants snf. CSW was unable to find patient in Pas[facility] system. Office is closed. Patient cannot go to snf without a pasrr.  [name] Fry LCSWA [phone]

## 2016-10-13 NOTE — Discharge Instructions (Signed)
[name] was hospitalized for having a very low salt in her blood. This was thought to be a side effect of her Valtrex which she was taking for shingles. We treated her for a syndrome called SIADH which improved with water restriction and stopping the Valtrex. She also experienced an episode of bleeding from her mouth which was not identified. GI was consulted but given the decision to avoid invasive procedures, and a stable hemoglobin, we did not pursue further imaging. She was seen by physical and occupational therapy and cleared for home health which has been arranged. Speech was consulted and clear her for swallow evaluation. Please encourage soft food diet on discharge. Please avoid aspirin.

## 2016-10-13 NOTE — Progress Notes (Signed)
Occupational Therapy worked with patient with family member present, patient not able to assist in standing or moving in bed.  Case management and occupational therapy spoke with family and family advised they will not be able to care for patient at home, requesting SNF placement. Case management is notifying social work.  Paged MD Abelardo DieselMcMullen to inform.

## 2016-10-13 NOTE — Progress Notes (Signed)
Initially discharge patient with family request for patient to go home. However, received a call sometime later stating that family now agreeable to SNF placement. Discharge order canceled and CSW aware. Will seek out SNF bed at this time.   Caryl AdaJazma Phelps, DO 10/13/2016, 4:23 PM

## 2016-10-13 NOTE — NC FL2 (Signed)
[place] MEDICAID FL2 LEVEL OF CARE SCREENING TOOL     IDENTIFICATION  Patient Name: [name] Fry Birthdate: 28-Jan-1920 Sex: female Admission Date (Current Location): 10/08/2016  [facility]   Facility and Address:  [facility] [place], [address], [place], [place] [address]      Provider Number: [id number]  Attending Physician Name and Address:  Carney LivingMarshall L Chambliss, MD  Relative Name and Phone Number:  Elinor DodgeGwendolyn, daughter, [phone]    Current Level of Care: Hospital Recommended Level of Care: Skilled Nursing Facility Prior Approval Number:    Date Approved/Denied:   PASRR Number:    Discharge Plan: SNF    Current Diagnoses: Patient Active Problem List   Diagnosis Date Noted  . Pleural effusion   . Goals of care, counseling/discussion   . Palliative care encounter   . Abdominal pain   . Upper GI bleed   . Generalized weakness   . Hyponatremia 10/08/2016  . Acute hyponatremia 10/08/2016  . Chronic anemia 12/23/2014  . Leukocytosis 12/23/2014  . History of pleural effusion 12/23/2014  . Aphasia 12/22/2014  . TIA (transient ischemic attack) 12/22/2014  . History of CVA (cerebrovascular accident) without residual deficits 12/22/2014  . Vitamin D deficiency 12/22/2014  . Allergic rhinitis 12/22/2014  . Osteoporosis 12/22/2014  . Protein-calorie malnutrition, severe (HCC) 07/11/2014  . Femoral neck fracture (HCC) 07/10/2014  . HTN (hypertension) 07/10/2014  . HLD (hyperlipidemia) 07/10/2014  . Closed right hip fracture (HCC) 07/10/2014    Orientation RESPIRATION BLADDER Height & Weight     Self  Normal Incontinent Weight: 46.2 kg (101 lb 12.8 oz) Height:  5\' 3"  (160 cm)  BEHAVIORAL SYMPTOMS/MOOD NEUROLOGICAL BOWEL NUTRITION STATUS      Continent Diet (Please see DC Summary)  AMBULATORY STATUS COMMUNICATION OF NEEDS Skin   Extensive Assist Verbally Normal                       Personal Care  Assistance Level of Assistance  Bathing, Feeding, Dressing Bathing Assistance: Maximum assistance Feeding assistance: Limited assistance Dressing Assistance: Limited assistance     Functional Limitations Info             SPECIAL CARE FACTORS FREQUENCY  PT (By licensed PT)     PT Frequency: 3x/week              Contractures      Additional Factors Info  Code Status, Allergies Code Status Info: DNR Allergies Info: NKA           Current Medications (10/13/2016):  This is the current hospital active medication list Current Facility-Administered Medications  Medication Dose Route Frequency Provider Last Rate Last Dose  . 0.9 %  sodium chloride infusion  250 mL Intravenous PRN Howard PouchLauren Feng, MD      . acetaminophen (TYLENOL) tablet 650 mg  650 mg Oral Q6H PRN Pincus LargeJazma Y Phelps, DO   650 mg at 10/12/16 1705  . calcium-vitamin D (OSCAL WITH D) 500-200 MG-UNIT per tablet 1 tablet  1 tablet Oral TID WC Pincus LargeJazma Y Phelps, DO   1 tablet at 10/13/16 1655  . docusate sodium (COLACE) capsule 200 mg  200 mg Oral Daily Howard PouchLauren Feng, MD   Stopped at 10/11/16 [phone]  . feeding supplement (ENSURE ENLIVE) (ENSURE ENLIVE) liquid 237 mL  237 mL Oral BID BM Pincus LargeJazma Y Phelps, DO   Stopped at 10/10/16 1758  . Glycerin (Adult) 2.1 g suppository 1 suppository  1 suppository Rectal Daily PRN Tillman SersAngela C Riccio, DO   1 suppository at 10/10/16 0644  . hydrALAZINE (APRESOLINE) injection 5 mg  5 mg Intravenous Q4H PRN Howard PouchLauren Feng, MD      . hydrALAZINE (APRESOLINE) tablet 75 mg  75 mg Oral TID Pincus LargeJazma Y Phelps, DO   75 mg at 10/13/16 1655  . ondansetron (ZOFRAN) injection 4 mg  4 mg Intravenous Q8H PRN Campbell StallKaty Dodd Mayo, MD   4 mg at 10/12/16 1157  . pantoprazole (PROTONIX) EC tablet 40 mg  40 mg Oral BID Wendee Beaversavid J McMullen, DO   40 mg at 10/13/16 0959  . polyethylene glycol (MIRALAX / GLYCOLAX) packet 17 g  17 g Oral BID PRN Tillman SersAngela C Riccio, DO   17 g at 10/10/16 1240  . pravastatin (PRAVACHOL) tablet 40 mg  40 mg Oral  Daily Pincus LargeJazma Y Phelps, DO   40 mg at 10/13/16 0959  . simethicone (MYLICON) 40 MG/0.6ML suspension 40 mg  40 mg Oral Q6H PRN Ranae PalmsSarah Elizabeth Dunn, NP   40 mg at 10/12/16 1643  . sodium chloride flush (NS) 0.9 % injection 3 mL  3 mL Intravenous Q12H Howard PouchLauren Feng, MD   3 mL at 10/13/16 1000  . sodium chloride flush (NS) 0.9 % injection 3 mL  3 mL Intravenous Q12H Howard PouchLauren Feng, MD   3 mL at 10/13/16 1000  . sodium chloride flush (NS) 0.9 % injection 3 mL  3 mL Intravenous PRN Howard PouchLauren Feng, MD         Discharge Medications: Please see discharge summary for a list of discharge medications.  Relevant Imaging Results:  Relevant Lab Results:   Additional Information SSN: [id number]59  Xylah Early S Tet[place], [place]

## 2016-10-13 NOTE — Progress Notes (Signed)
Occupational Therapy Evaluation Patient Details Name: [name] Fry MRN: [id number] DOB: Jul 04, 1920 Today's Date: 10/13/2016    History of Present Illness Patient is a 82 y/o famele with hx of HTN, HLD, CVA presents with weakness. Found to have Hyponatremia with chronic worsening likely due to hypotonic fluid intake with diuretics and possible SIADH.    Clinical Impression   PTA, pt lived at home with family and was mod I with mobility (used RW for community) and completed her self care independently. Family assisted with IADL tasks. Pt demonstrates a significant functional decline and currently requries total A +2 with mobility and total A with ADL. Discussed with family who apparently thought pt would be ambulating with a RW and state that they prefer the pt have rehab before returning home. Given pt's PLOF, feel pt would beneefit from rehab to facilitate safe return home with family. Discussed with nsg. Family would like [facility] if bed available. Will follow acutely to address established goals.     Follow Up Recommendations  SNF;Supervision/Assistance - 24 hour    Equipment Recommendations  Other (comment) (TBA at SNF)    Recommendations for Other Services       Precautions / Restrictions Precautions Precautions: Fall      Mobility Bed Mobility Overal bed mobility: Needs Assistance;+2 for physical assistance Bed Mobility: Rolling;Supine to Sit;Sit to Supine Rolling: Total assist   Supine to sit: Total assist;+2 for physical assistance Sit to supine: +2 for physical assistance;Total assist   General bed mobility comments: use of pad to move pt to EOB. unableot bring legs to edge of bed Pt did not initiate any movement to get to EOB Transfers Overall transfer level: Needs assistance   Transfers: Sit to/from Stand Sit to Stand: Total assist              Balance     Sitting balance-Leahy Scale: Poor (difficulty holding head up; leaning forward. unsafe to sit  EOB without support)       Standing balance-Leahy Scale: Zero                              ADL Overall ADL's : Needs assistance/impaired                                     Functional mobility during ADLs: Total assistance;+2 for physical assistance General ADL Comments: total A with all ADLPt has difficulty bringing cloth to face due to weakness.      Vision  wears glasses   Perception     Praxis      Pertinent Vitals/Pain Pain Assessment: Faces Pain Score: 5  Pain Location: general dicomfort all over Pain Descriptors / Indicators: Grimacing Pain Intervention(s): Limited activity within patient's tolerance     Hand Dominance  R   Extremity/Trunk Assessment Upper Extremity Assessment Upper Extremity Assessment: Generalized weakness (difficulty lifting BUE off bed)?2/5 throughout   Lower Extremity Assessment Lower Extremity Assessment: Generalized weakness (unable to lift legs off bed)   Cervical / Trunk Assessment Cervical / Trunk Assessment: Kyphotic   Communication Communication Communication: No difficulties   Cognition Arousal/Alertness: Lethargic Behavior During Therapy: Flat affect Overall Cognitive Status: Impaired/Different from baseline Area of Impairment: Following commands;Problem solving;Safety/judgement             Problem Solving: Slow processing     General Comments  Exercises       Shoulder Instructions      Home Living Family/patient expects to be discharged to:: Skilled nursing facility                                        Prior Functioning/Environment Level of Independence: Needs assistance  Gait / Transfers Assistance Needed: Uses RW for community ambulation and independent for household ambulation. ADL's / Homemaking Assistance Needed: Family does all IADLs and drives.             OT Problem List: Decreased strength;Decreased range of motion;Decreased activity  tolerance;Impaired balance (sitting and/or standing);Decreased coordination;Decreased cognition;Decreased safety awareness;Decreased knowledge of use of DME or AE;Cardiopulmonary status limiting activity;Impaired UE functional use;Pain   OT Treatment/Interventions: Self-care/ADL training;Therapeutic exercise;Energy conservation;DME and/or AE instruction;Therapeutic activities;Cognitive remediation/compensation;Patient/family education;Balance training    OT Goals(Current goals can be found in the care plan section) Acute Rehab OT Goals Patient Stated Goal: none stated OT Goal Formulation: With patient/family Time For Goal Achievement: 10/27/16 Potential to Achieve Goals: Good  OT Frequency: Min 2X/week   Barriers to D/C:            Co-evaluation              End of Session Nurse Communication: Mobility status;Other (comment) (concerns regarding DC)  Activity Tolerance: Patient limited by fatigue Patient left: in bed;with call bell/phone within reach;with family/visitor present   Time: 1520-1605 OT Time Calculation (min): 45 min Charges:  OT General Charges $OT Visit: 1 Procedure OT Evaluation $OT Eval Moderate Complexity: 1 Procedure OT Treatments $Self Care/Home Management : 23-37 mins G-Codes:    Roniesha Hollingshead,HILLARY 10/13/2016, 4:15 PM  [facility] Morad Tal, OTR/L  [phone] 10/13/2016

## 2016-10-13 NOTE — Care Management Note (Addendum)
Case Management Note  Patient Details  Name: [name] Fry MRN: [id number] Date of Birth: August 14, 1920  Subjective/Objective:    Adm w hyponatremia                Action/Plan:phy there and occup ther  Expected Discharge Date:                  Expected Discharge Plan:  Home w Home Health Services  In-House Referral:  Hospice / Palliative Care  Discharge planning Services  CM Consult  Post Acute Care Choice:  Home Health Choice offered to:  Adult Children  DME Arranged:    DME Agency:     HH Arranged:  PT, OT [facility]  Status of Service:  Completed, signed off  If discussed at Long Length of Stay Meetings, dates discussed:    Additional Comments:lives w da gwendolyn. Spoke w gwendolyn and she would like to use same hhc agency she had in 2015 which is [facility]. Would like [facility] if she still works for agency. Ref to mary w [facility] for hhot and hhpt. Alerted mary w [facility] that pt dc this afternoon. hhc will call and set up appt.  [name] Fry, [name] Landini T, RN 10/13/2016, 9:50 AM

## 2016-10-14 DIAGNOSIS — E871 Hypo-osmolality and hyponatremia: Secondary | ICD-10-CM | POA: Diagnosis not present

## 2016-10-14 DIAGNOSIS — R531 Weakness: Secondary | ICD-10-CM | POA: Diagnosis not present

## 2016-10-14 DIAGNOSIS — K922 Gastrointestinal hemorrhage, unspecified: Secondary | ICD-10-CM | POA: Diagnosis not present

## 2016-10-14 DIAGNOSIS — J90 Pleural effusion, not elsewhere classified: Secondary | ICD-10-CM | POA: Diagnosis not present

## 2016-10-14 DIAGNOSIS — R109 Unspecified abdominal pain: Secondary | ICD-10-CM | POA: Diagnosis not present

## 2016-10-14 MED ORDER — DOCUSATE SODIUM 50 MG/5ML PO LIQD
200.0000 mg | Freq: Every day | ORAL | Status: DC
  Administered 2016-10-15 – 2016-10-18 (×4): 200 mg via ORAL
  Filled 2016-10-14 (×5): qty 20

## 2016-10-14 MED ORDER — DOCUSATE SODIUM 50 MG/5ML PO LIQD: 200.0000 mg | Freq: Every day | ORAL | Status: DC

## 2016-10-14 NOTE — Progress Notes (Signed)
Family Medicine Teaching Service Daily Progress Note Intern Pager: [phone]  Patient name: [name] Fry Medical record number: [id number] Date of birth: 1920-11-01 Age: 82 y.o. Gender: female  Primary Care Provider: Wilmer Floor., MD Consultants: Gastroenterology, Palliative Care Code Status: DNR (as of 10/12/2016, per palliative)  Pt Overview and Major Events to Date:  11/17: Admitted with hyponatremia and weakness 11/20: Pt spitting up blood with stable vitals and physical exam, transferred to SDU 11/21: Palliative meeting w/ family decided med mgmt only, no invasive studies or procedures  Assessment and Plan: [name] Fry is a 82 y.o. female presenting with generalized weakness. PMH is significant for HTN, HLD, closed right hip fracture, TIA, hx CVA, chronic anemia.   #Severe Hyponatremia, Chronic, Improved: Chronic in nature according to family. PCP is working this up. Unclear etiology at this time but most likely SIADH vs cancinoma. She meets SIADH criteria with low serum osmolality and elevated urine sodium, however does not appear to have concentrated urine. Have ruled out hypothyroidism and hypoandrelenism. Euvolemic on exam. Most common cause of SIADH is small cell lung carcinoma. CXR with some chronic nodularity findings; no new masses. Iatrogenic causes: patient is on spironolactone which is a diuretic and has been associated with fluid/electrolyte abnormalities such as hyponatremia; holding. Hyperglycemia ruled out with wnl blood sugars. Glucocorticoid deficiency r/o cortisol level normal. Renal function good and not apparent cause for hyponatremia. Valtrex for shingles d/c'd 11/20 as hyponatremia is side effect.  --Monitor for symptoms of hyponatremia --Telemetry --SLIV, no free water --Encourage salt with meals --intermittent monitoring  #Bright Red Blood Per Mouth, Acute, Resolved: Bright red blood power mouth with small clot 11/20. No hemorrhaging on evaluation.  Tongue atraumatic. Vitals stable and pt denied pain. No witnessed seizures and difficult to evaluate if incontinent given baseline incontinence. No bright red blood per mouth since initial episode 11/20. SLP eval shows no dysphagia, but uncomfortable with fluids. Soft diet on d/c. --Consulted GI, they have signed off because family does not want aggressive management --Holding ASA and Lovenox SQ --Consider abdominal CT if abd pain worsens  #Abdominal Pain, Possibly 2/2 Constipation, Improved:   Diffuse abdominal pain, one hard stool 11/18 with multiple episodes of emesis 11/19 per family at bedside. ABX w/ gas throughout the abdomen including small bowel and colon w/o distention w/ non-obstructive pattern. --Colace to 200 mg QD --Miralax 17g BID  #EKG Changes, Chronic, Stable:  Appears chronic in nature without any significant changes from 2015 EKG. Has ST depression in leads II, III, AVF, V4, V5. Also with T-wave inversion in II and III. RBBB. Troponins all negative. Denies chest pain. No concern for ACS at this time. Repeat EKG yesterday AM with same findings.   #Generalized Weakness, Possible Encephalopathy, Improved Likely 2/2 to hyponatremia. At baseline patient able to ambulate and perform ADLs. Weakness is a symptom of hyponatremia. Strength on admission 4/4. Head CT negative for acute abnormality. A&O.  --Monitor electrolytes; replace as needed --Monitor neurological exam --PT/OT for SNF placement and daily ambualting  #Hypertension, Chronic, Stable: Well-controlled.  At home, controlled with hydralazine 75 mg TID, spironolactone 50 mg qd, metoprolol 200 mg qd.  --Continue home hydralazine and hold metoprolol, hold spironolactone (diuretics can worsen hyponatremia) --Monitor BP  #Shingles, Chronic, Stable: Recent diagnosis. Was seen in ED on 11/12. Sent home with 4 days of prednisone and Valtrex for 7 days. Lesions looked healed and scabbed over. Completed steroids. Discontinued  valtrex given possible side effect of hyponatremia. --Monitor  #Leukocytosis, Acute,  Resolved: Most likely 2/2 recent steriod use. No signs of infection. She is afebrile and vitals are stable. UA unremarkable. Diff 78% neutrophils, elevated16.3 K/ul neutrophil antibiodies [ref 1.7-7.7], elevated monocytes 1.7 [ref0.1-1.0]  #Osteopenia vs Ssteoporosis, Chronic, Stable: --Hold home aldronate and continue oscal-D  #HLD, Chronic, Stable: Most recent lipid panel 12/2014 with results cholesterol 125, triglycerides 84, HDL 52, LDL 56, VLDL 17.  Patient on pravastatin 40 mg at home. --Continue pravastatin and holding ASA  #Hx CVA, Chronic, Stable: Some residual throat weakness reported however no other focal neurologic deficits.  On admission, neurological exam was normal.  FEN/GI: Regular diet, SLIV Prophylaxis: SCDs  Disposition: Discharge when SNF bed available  Subjective:  Doing well this morning. Complaining of fatigue and weakness. Is asking for her home milk of mag so that she can have a BM.   Objective: Temp:  [98.1 F (36.7 C)-98.8 F (37.1 C)] 98.4 F (36.9 C) (11/22 2210) Pulse Rate:  [87-95] 88 (11/22 2210) Resp:  [0-25] 18 (11/22 2210) BP: (115-153)/(55-73) 140/64 (11/22 2210) SpO2:  [94 %-100 %] 100 % (11/22 2210) Physical Exam: General: NAD, rests comfortably in bed, elderly AA woman, pleasant Cardiovascular: RRR, no m/r/g Respiratory: CTA bil, no W/R/R appreciated Gastrointestinal: Soft, mildly tender, nondistended, +BS MSK: moves all extremities equally Derm: right flank and RLQ abdomen with scab lesions in the T10 dermatome Neuro: nonfocal exam, follows commands  Laboratory: Lactic acid: 1.5 Phos: 2.8 Mag: 2.1 Troponin: 0.03 x3 TSH: 1.520 Cortisol: 5.9 SOsm: 242, UOsm 491 FENa: 0.8% pre-renal UA: small leuks  Recent Labs Lab 10/11/16 0456  10/12/16 0228 10/12/16 0946 10/12/16 2204 10/13/16 0311  WBC 17.8*  --  17.5*  --   --  9.3  HGB 11.0*   < > 9.3* 9.0* 9.1* 9.5*  HCT 30.2*  < > 26.8* 25.8* 25.7* 27.6*  PLT 289  --  254  --   --  266  < > = values in this interval not displayed.  Recent Labs Lab 10/11/16 0456 10/12/16 0228 10/13/16 0311  NA 116* 126* 132*  K 3.7 3.3* 3.2*  CL 82* 90* 96*  CO2 27 29 27   BUN 38* 37* 18  CREATININE 1.15* 1.01* 0.84  CALCIUM 8.2* 8.1* 8.2*  GLUCOSE 115* 89 84    Imaging/Diagnostic Tests: DG Abd Portable 1V (10/10/2016) FINDINGS: Gas throughout the abdomen including small bowel and colon. No abnormal distention. Vascular calcifications and pelvic phleboliths. Scoliotic curvature of the lumbar spine with multilevel degenerative disc disease. Surgical changes of right hip arthroplasty. No displaced fracture.  IMPRESSION: Nonobstructive bowel gas pattern.  Dg Chest 2 View (10/08/2016) FINDINGS: There is chronic cardiomegaly with calcification and tortuosity of the thoracic aorta. There small chronic bilateral pleural effusions with a nodular appearing density at the right lung base, unchanged since 2016. This may represent loculated fluid. There are no discrete infiltrates. Chronic pleural thickening laterally in the left midzone. Thoracolumbar scoliosis with old compression deformities in the mid upper thoracic spine.  IMPRESSION: No acute changes. Chronic bilateral pleural effusions with chronic nodularity of the right lung base. Osteopenia with old compression fractures. Aortic atherosclerosis and chronic cardiomegaly.  Ct Head Wo Contrast (10/08/2016) FINDINGS: Brain: No evidence of acute infarction, hemorrhage, hydrocephalus, extra-axial collection or mass lesion/mass effect. There is mild diffuse low-attenuation within the subcortical and periventricular white matter compatible with chronic microvascular disease. Prominence of the sulci and ventricles noted compatible with brain atrophy. Vascular: No hyperdense vessel or unexpected calcification. Skull: Normal. Negative for  fracture  or focal lesion. Sinuses/Orbits: No acute finding. Other: None.  IMPRESSION: 1. No acute intracranial abnormality. 2. Chronic small vessel ischemic change and brain atrophy.    Pincus LargeJazma Y Kendyn Zaman, DO 10/14/2016, 3:30 AM PGY-3, [place] [facility] FPTS Intern pager: [phone], text pages welcome

## 2016-10-15 DIAGNOSIS — R109 Unspecified abdominal pain: Secondary | ICD-10-CM | POA: Diagnosis not present

## 2016-10-15 DIAGNOSIS — E871 Hypo-osmolality and hyponatremia: Secondary | ICD-10-CM | POA: Diagnosis not present

## 2016-10-15 DIAGNOSIS — K922 Gastrointestinal hemorrhage, unspecified: Secondary | ICD-10-CM | POA: Diagnosis not present

## 2016-10-15 DIAGNOSIS — Z515 Encounter for palliative care: Secondary | ICD-10-CM

## 2016-10-15 MED ORDER — PANTOPRAZOLE SODIUM 40 MG IV SOLR
40.0000 mg | Freq: Two times a day (BID) | INTRAVENOUS | Status: DC
  Administered 2016-10-15 – 2016-10-18 (×7): 40 mg via INTRAVENOUS
  Filled 2016-10-15 (×7): qty 40

## 2016-10-15 NOTE — Care Management Note (Signed)
Case Management Note  Patient Details  Name: [name] Fry MRN: [id number] Date of Birth: [birth year]  Subjective/Objective:                  CM spoke with FM Resident [facility] who states patiet will DC to SNF today. CM updated CSW of DC plan for today. CM verified with Consuello BossierLonnie Charge RN that PT had been contacted to reeval and enter rec for SNF (per their assessment) as needed for CSW to proceed with placement.    Action/Plan:  Anticipate DC to SNF today as facilitated by CSW.   Expected Discharge Date:                  Expected Discharge Plan:  Skilled Nursing Facility  In-House Referral:  Hospice / Palliative Care, Clinical Social Work  Discharge planning Services  CM Consult  Post Acute Care Choice:    Choice offered to:  Adult Children  DME Arranged:    DME Agency:     HH Arranged:    HH Agency:     Status of Service:  Completed, signed off  If discussed at Long Length of Stay Meetings, dates discussed:    Additional Comments:  Lawerance SabalDebbie Betheny Suchecki, RN 10/15/2016, 10:46 AM

## 2016-10-15 NOTE — Progress Notes (Signed)
Physical Therapy Treatment Patient Details Name: Barkley Brunslice E Mahrt MRN: [id number] DOB: 14-Apr-1920 Today's Date: 10/15/2016    History of Present Illness Patient is a 82 y/o famele with hx of HTN, HLD, CVA presents with weakness. Found to have Hyponatremia with chronic worsening likely due to hypotonic fluid intake with diuretics and possible SIADH.     PT Comments    Patient seen in conjunction with OT therapist for reassessment of functional status due to reported decline in function. At this time, patient very weak and limited with all aspects of activity. Required 2 person assist to transfer OOB. Assist for functional tasks and hygiene. Patient was able to to tolerate OOB activity with multiple transfers. Encouraged patient to continue activity with staff. Based on current functional status, recommend ST SNF upon acute discharge to recover function and decreased BOC. Discharge recommendation updated.  Follow Up Recommendations  SNF;Supervision/Assistance - 24 hour;Supervision for mobility/OOB     Equipment Recommendations  None recommended by PT    Recommendations for Other Services OT consult     Precautions / Restrictions Precautions Precautions: Fall Restrictions Weight Bearing Restrictions: No    Mobility  Bed Mobility Overal bed mobility: Needs Assistance;+2 for physical assistance Bed Mobility: Supine to Sit     Supine to sit: Max assist     General bed mobility comments: Patient able to move Bilateral LEs toward EOB with increased cues, Max assist to elevate trunk to upright at EOB  Transfers Overall transfer level: Needs assistance Equipment used: 2 person hand held assist Transfers: Sit to/from [employer]Stand;Stand Pivot Transfers Sit to Stand: Max assist;+2 physical assistance Stand pivot transfers: Max assist;+2 physical assistance       General transfer comment: patient able to power to upright positioning, increased time to perform, manual assist for hand  placement on therapists arm. Multi-modal cues for pivot to [facility] then from [facility] to recliner.   Ambulation/Gait             General Gait Details: fatigued with picotal steps to chair, did not ambulate this session   Stairs            Wheelchair Mobility    Modified Rankin (Stroke Patients Only)       Balance Overall balance assessment: Needs assistance Sitting-balance support: Feet supported Sitting balance-Leahy Scale: Poor (difficulty holding head up; leaning forward. unsafe to sit E)     Standing balance support: During functional activity Standing balance-Leahy Scale: Zero Standing balance comment: heavy reliance on bilateral support                    Cognition Arousal/Alertness: Lethargic Behavior During Therapy: Flat affect Overall Cognitive Status: Impaired/Different from baseline Area of Impairment: Following commands;Problem solving;Safety/judgement             Problem Solving: Slow processing      Exercises      General Comments        Pertinent Vitals/Pain Pain Assessment: Faces Faces Pain Scale: Hurts little more Pain Location: generalized pain Pain Descriptors / Indicators: Grimacing Pain Intervention(s): Monitored during session    Home Living                      Prior Function            PT Goals (current goals can now be found in the care plan section) Acute Rehab PT Goals Patient Stated Goal: none stated PT Goal Formulation: With patient Time For Goal Achievement: 10/23/16  Potential to Achieve Goals: Good Progress towards PT goals: Not progressing toward goals - comment    Frequency    Min 3X/week      PT Plan Discharge plan needs to be updated    Co-evaluation PT/OT/SLP Co-Evaluation/Treatment: Yes Reason for Co-Treatment: Necessary to address cognition/behavior during functional activity;For patient/therapist safety PT goals addressed during session: Mobility/safety with mobility        End of Session   Activity Tolerance: Patient tolerated treatment well Patient left: in chair;with call bell/phone within reach;with chair alarm set     Time: 1007-1031 PT Time Calculation (min) (ACUTE ONLY): 24 min  Charges:  $Therapeutic Activity: 8-22 mins                    G Codes:      Fabio AsaDevon J Khalik Pewitt 10/15/2016, 10:42 AM Charlotte Crumbevon Hope Holst, PT DPT  [phone]

## 2016-10-15 NOTE — Progress Notes (Signed)
Occupational Therapy Treatment Patient Details Name: [name] Fry E Carley MRN: [id number] DOB: 18-Oct-1920 Today's Date: 10/15/2016    History of present illness Patient is a 82 y/o famele with hx of HTN, HLD, CVA presents with weakness. Found to have Hyponatremia with chronic worsening likely due to hypotonic fluid intake with diuretics and possible SIADH.    OT comments  Pt required max assist +2 for stand pivot to [facility] today with total assist for peri care. Pt attempting brushing hair in sitting but required max assist to perform. D/c plan remains appropriate. Will continue to follow acutely.   Follow Up Recommendations  SNF;Supervision/Assistance - 24 hour    Equipment Recommendations  Other (comment) (TBD at next venue)    Recommendations for Other Services      Precautions / Restrictions Precautions Precautions: Fall Restrictions Weight Bearing Restrictions: No       Mobility Bed Mobility Overal bed mobility: Needs Assistance;+2 for physical assistance Bed Mobility: Supine to Sit     Supine to sit: Max assist     General bed mobility comments: Patient able to move Bilateral LEs toward EOB with increased cues, Max assist to elevate trunk to upright at EOB  Transfers Overall transfer level: Needs assistance Equipment used: 2 person hand held assist Transfers: Sit to/from [employer]Stand;Stand Pivot Transfers Sit to Stand: Max assist;+2 physical assistance Stand pivot transfers: Max assist;+2 physical assistance       General transfer comment: patient able to power to upright positioning, increased time to perform, manual assist for hand placement on therapists arm. Multi-modal cues for pivot to [facility] then [facility] to recliner.     Balance Overall balance assessment: Needs assistance Sitting-balance support: Feet supported Sitting balance-Leahy Scale: Poor     Standing balance support: During functional activity Standing balance-Leahy Scale: Zero Standing balance comment:  heavy reliance on bil UE support                   ADL Overall ADL's : Needs assistance/impaired     Grooming: Maximal assistance;Sitting;Brushing hair               Lower Body Dressing: Total assistance;Bed level Lower Body Dressing Details (indicate cue type and reason): to don socks Toilet Transfer: Maximal assistance;+2 for physical assistance;Stand-pivot;BSC   Toileting- Clothing Manipulation and Hygiene: Total assistance;Sit to/from stand       Functional mobility during ADLs: Maximal assistance;+2 for physical assistance (for stand pivot only) General ADL Comments: Pt set up with breakfast, encouraged self feeding but RN tech aware pt attempting to eat if she needs assist.      Vision                     Perception     Praxis      Cognition   Behavior During Therapy: Flat affect Overall Cognitive Status: Impaired/Different from baseline Area of Impairment: Following commands;Problem solving;Safety/judgement              Problem Solving: Slow processing      Extremity/Trunk Assessment               Exercises     Shoulder Instructions       General Comments      Pertinent Vitals/ Pain       Pain Assessment: Faces Faces Pain Scale: Hurts little more Pain Location: generalized Pain Descriptors / Indicators: Grimacing Pain Intervention(s): Monitored during session  Home Living  Prior Functioning/Environment              Frequency  Min 2X/week        Progress Toward Goals  OT Goals(current goals can now be found in the care plan section)  Progress towards OT goals: Progressing toward goals  Acute Rehab OT Goals Patient Stated Goal: none stated OT Goal Formulation: With patient  Plan Discharge plan remains appropriate    Co-evaluation    PT/OT/SLP Co-Evaluation/Treatment: Yes Reason for Co-Treatment: Necessary to address cognition/behavior  during functional activity;For patient/therapist safety PT goals addressed during session: Mobility/safety with mobility OT goals addressed during session: ADL's and self-care      End of Session     Activity Tolerance Patient tolerated treatment well   Patient Left in chair;with call bell/phone within reach;with chair alarm set   Nurse Communication Mobility status        Time: 1000-1024 OT Time Calculation (min): 24 min  Charges: OT General Charges $OT Visit: 1 Procedure OT Treatments $Self Care/Home Management : 8-22 mins  Gaye AlkenBailey A Demetrice Amstutz M.S., OTR/L Pager: [phone]  10/15/2016, 11:07 AM

## 2016-10-15 NOTE — Clinical Social Work Note (Signed)
Pt is unable to discharge today as PASARR is unable to be obtained. PASARR is needed for placement and is unable to discharge to SNF without one. Bed offers were given and [facility] Facility will be able to accept pt when PASARR is obtained. [facility] been [facility], and they are aware as well. Pt and family are aware. Pt is also very supportive. CSW will continue to follow.   Dede QuerySarah Brazen Domangue, MSW, LCSW  Clinical Social Worker  [phone]

## 2016-10-15 NOTE — Progress Notes (Signed)
Family Medicine Teaching Service Daily Progress Note Intern Pager: [phone]  Patient name: [name] Fry Medical record number: [id number] Date of birth: Oct 09, 1920 Age: 82 y.o. Gender: female  Primary Care Provider: Wilmer FloorAMPBELL, STEPHEN D., MD Consultants: Gastroenterology, Palliative Care Code Status: DNR (as of 10/12/2016, per palliative)  Pt Overview and Major Events to Date:  11/17: Admitted with hyponatremia and weakness 11/20: Pt spitting up blood with stable vitals and physical exam, transferred to SDU 11/21: Palliative meeting w/ family decided med mgmt only, no invasive studies or procedures  Assessment and Plan: [name] Fry is a 82 y.o. female presenting with generalized weakness. PMH is significant for HTN, HLD, closed right hip fracture, TIA, hx CVA, chronic anemia.   #Severe Hyponatremia, Chronic, Improved: Chronic in nature according to family. PCP is working this up. Unclear etiology at this time but most likely SIADH vs cancinoma. Valtrex for shingles d/c'd 11/20 as hyponatremia is side effect. She meets SIADH criteria with low serum osmolality and elevated urine sodium, however does not appear to have concentrated urine. Have ruled out hypothyroidism and hypoandrelenism. Euvolemic on exam.  --Monitor for symptoms of hyponatremia --Telemetry --SLIV, no free water --Encourage salt with meals --intermittent monitoring  #Bright Red Blood Per Mouth, Acute, Resolved: Bright red blood power mouth with small clot 11/20. No hemorrhaging on evaluation. Tongue atraumatic. Vitals stable and pt denied pain. No witnessed seizures and difficult to evaluate if incontinent given baseline incontinence. No bright red blood per mouth since initial episode 11/20. SLP eval shows no dysphagia, but uncomfortable with fluids. Soft diet on d/c. --Consulted GI, they have signed off because family does not want aggressive management --Holding ASA and Lovenox SQ --Consider abdominal CT if abd  pain worsens  #Abdominal Pain, Possibly 2/2 Constipation, Improved:   Diffuse abdominal pain, one hard stool 11/18 with multiple episodes of emesis 11/19 per family at bedside. ABX w/ gas throughout the abdomen including small bowel and colon w/o distention w/ non-obstructive pattern. --Colace to 200 mg QD --Miralax 17g BID  #EKG Changes, Chronic, Stable:  Appears chronic in nature without any significant changes from 2015 EKG. Has ST depression in leads II, III, AVF, V4, V5. Also with T-wave inversion in II and III. RBBB. Troponins all negative. Denies chest pain. No concern for ACS at this time. Repeat EKG yesterday AM with same findings.   #Generalized Weakness, Possible Encephalopathy, Improved Likely 2/2 to hyponatremia. At baseline patient able to ambulate and perform ADLs. Weakness is a symptom of hyponatremia. Strength on admission 4/4. Head CT negative for acute abnormality. A&O.  --Monitor electrolytes; replace as needed --Monitor neurological exam --PT/OT (imminent discharge) for SNF placement and ambualting  #Hypertension, Chronic, Stable: Well-controlled.  At home, controlled with hydralazine 75 mg TID, spironolactone 50 mg qd, metoprolol 200 mg qd.  --Continue home hydralazine and hold metoprolol, hold spironolactone (diuretics can worsen hyponatremia) --Monitor BP  #Shingles, Chronic, Stable: Recent diagnosis. Was seen in ED on 11/12. Sent home with 4 days of prednisone and Valtrex for 7 days. Lesions looked healed and scabbed over. Completed steroids. Discontinued valtrex given possible side effect of hyponatremia. --Monitor  #Leukocytosis, Acute, Resolved: Most likely 2/2 recent steriod use. No signs of infection. She is afebrile and vitals are stable. UA unremarkable. Diff 78% neutrophils, elevated16.3 K/ul neutrophil antibiodies [ref 1.7-7.7], elevated monocytes 1.7 [ref0.1-1.0]  #Osteopenia vs Steoporosis, Chronic, Stable: --Hold home aldronate and continue  oscal-D  #HLD, Chronic, Stable: Most recent lipid panel 12/2014 with results cholesterol 125, triglycerides 84, HDL  52, LDL 56, VLDL 17.  Patient on pravastatin 40 mg at home. --Continue pravastatin and holding ASA  #Hx CVA, Chronic, Stable: Some residual throat weakness reported however no other focal neurologic deficits.  On admission, neurological exam was normal.  FEN/GI: Regular diet, SLIV Prophylaxis: SCDs  Disposition: Discharge after PT assessment and when SNF bed available  Subjective:  Doing well this morning. Complaining of fatigue and weakness. Denies any overnight events or issues.  Objective: Temp:  [98 F (36.7 C)-99.3 F (37.4 C)] 98 F (36.7 C) (11/24 0525) Pulse Rate:  [91-95] 95 (11/24 0525) Resp:  [18] 18 (11/24 0525) BP: (128-139)/(57-72) 139/68 (11/24 0525) SpO2:  [100 %] 100 % (11/24 0525) Physical Exam: General: NAD, rests comfortably in bed, elderly AA woman, pleasant Cardiovascular: RRR, no m/r/g Respiratory: CTA bil, no W/R/R appreciated Gastrointestinal: Soft, mildly tender, nondistended, +BS MSK: moves all extremities equally Derm: right flank and RLQ abdomen with scab lesions in the T10 dermatome Neuro: nonfocal exam, follows commands  Laboratory: Lactic acid: 1.5 Phos: 2.8 Mag: 2.1 Troponin: 0.03 x3 TSH: 1.520 Cortisol: 5.9 SOsm: 242, UOsm 491 FENa: 0.8% pre-renal UA: small leuks  Recent Labs Lab 10/11/16 0456  10/12/16 0228 10/12/16 0946 10/12/16 2204 10/13/16 0311  WBC 17.8*  --  17.5*  --   --  9.3  HGB 11.0*  < > 9.3* 9.0* 9.1* 9.5*  HCT 30.2*  < > 26.8* 25.8* 25.7* 27.6*  PLT 289  --  254  --   --  266  < > = values in this interval not displayed.  Recent Labs Lab 10/11/16 0456 10/12/16 0228 10/13/16 0311  NA 116* 126* 132*  K 3.7 3.3* 3.2*  CL 82* 90* 96*  CO2 27 29 27   BUN 38* 37* 18  CREATININE 1.15* 1.01* 0.84  CALCIUM 8.2* 8.1* 8.2*  GLUCOSE 115* 89 84    Imaging/Diagnostic Tests: DG Abd Portable 1V  (10/10/2016) FINDINGS: Gas throughout the abdomen including small bowel and colon. No abnormal distention. Vascular calcifications and pelvic phleboliths. Scoliotic curvature of the lumbar spine with multilevel degenerative disc disease. Surgical changes of right hip arthroplasty. No displaced fracture.  IMPRESSION: Nonobstructive bowel gas pattern.  Dg Chest 2 View (10/08/2016) FINDINGS: There is chronic cardiomegaly with calcification and tortuosity of the thoracic aorta. There small chronic bilateral pleural effusions with a nodular appearing density at the right lung base, unchanged since 2016. This may represent loculated fluid. There are no discrete infiltrates. Chronic pleural thickening laterally in the left midzone. Thoracolumbar scoliosis with old compression deformities in the mid upper thoracic spine.  IMPRESSION: No acute changes. Chronic bilateral pleural effusions with chronic nodularity of the right lung base. Osteopenia with old compression fractures. Aortic atherosclerosis and chronic cardiomegaly.  Ct Head Wo Contrast (10/08/2016) FINDINGS: Brain: No evidence of acute infarction, hemorrhage, hydrocephalus, extra-axial collection or mass lesion/mass effect. There is mild diffuse low-attenuation within the subcortical and periventricular white matter compatible with chronic microvascular disease. Prominence of the sulci and ventricles noted compatible with brain atrophy. Vascular: No hyperdense vessel or unexpected calcification. Skull: Normal. Negative for fracture or focal lesion. Sinuses/Orbits: No acute finding. Other: None.  IMPRESSION: 1. No acute intracranial abnormality. 2. Chronic small vessel ischemic change and brain atrophy.    Kathee DeltonIan D McKeag, MD 10/15/2016, 8:25 AM PGY-3, [facility] FPTS Intern pager: [phone], text pages welcome

## 2016-10-16 DIAGNOSIS — R531 Weakness: Secondary | ICD-10-CM | POA: Diagnosis not present

## 2016-10-16 DIAGNOSIS — R109 Unspecified abdominal pain: Secondary | ICD-10-CM | POA: Diagnosis not present

## 2016-10-16 DIAGNOSIS — E871 Hypo-osmolality and hyponatremia: Secondary | ICD-10-CM | POA: Diagnosis not present

## 2016-10-16 DIAGNOSIS — J90 Pleural effusion, not elsewhere classified: Secondary | ICD-10-CM | POA: Diagnosis not present

## 2016-10-16 LAB — CBC
HCT: 24 % — ABNORMAL LOW (ref 36.0–46.0)
HEMOGLOBIN: 8.1 g/dL — AB (ref 12.0–15.0)
MCH: 29.5 pg (ref 26.0–34.0)
MCHC: 33.8 g/dL (ref 30.0–36.0)
MCV: 87.3 fL (ref 78.0–100.0)
Platelets: 244 10*3/uL (ref 150–400)
RBC: 2.75 MIL/uL — AB (ref 3.87–5.11)
RDW: 14.5 % (ref 11.5–15.5)
WBC: 11.1 10*3/uL — ABNORMAL HIGH (ref 4.0–10.5)

## 2016-10-16 LAB — BASIC METABOLIC PANEL
Anion gap: 6 (ref 5–15)
BUN: 9 mg/dL (ref 6–20)
CHLORIDE: 98 mmol/L — AB (ref 101–111)
CO2: 30 mmol/L (ref 22–32)
CREATININE: 0.75 mg/dL (ref 0.44–1.00)
Calcium: 8.1 mg/dL — ABNORMAL LOW (ref 8.9–10.3)
GFR calc Af Amer: >60 mL/min (ref >60)
GFR calc non Af Amer: >60 mL/min (ref >60)
GLUCOSE: 91 mg/dL (ref 65–99)
POTASSIUM: 3.8 mmol/L (ref 3.5–5.1)
Sodium: 134 mmol/L — ABNORMAL LOW (ref 135–145)

## 2016-10-16 NOTE — Progress Notes (Signed)
Family Medicine Teaching Service Daily Progress Note Intern Pager: [phone]  Patient name: [name] Fry Medical record number: [id number] Date of birth: [birth year] Age: 82 y.o. Gender: female  Primary Care Provider: Wilmer FloorAMPBELL, STEPHEN D., MD Consultants: Gastroenterology, Palliative Care Code Status: DNR (as of 10/12/2016, per palliative)  Pt Overview and Major Events to Date:  11/17: Admitted with hyponatremia and weakness 11/20: Pt spitting up blood with stable vitals and physical exam, transferred to SDU 11/21: Palliative meeting w/ family decided med mgmt only, no invasive studies or procedures  Assessment and Plan: [name] Fry is a 82 y.o. female presenting with generalized weakness. PMH is significant for HTN, HLD, closed right hip fracture, TIA, hx CVA, chronic anemia.   #Severe Hyponatremia, Improved: Acute on Chronic, being worked up by PCP. Na now 134. Considered SIADH, carcinoma, use of valtrex (hyponatremia as side effect), iatrogenic (spironolactone, held). Hyperglycemia ruled out with normal sugars. Glucocorticoid r/o cortisol normal. Renal function good and not apparent cause for hyponatremia. Valtrex for shingles d/c'd 11/20 as hyponatremia is side effect.  - Monitor for symptoms of hyponatremia - SLIV, no free water - Encourage salt with meals - intermittent monitoring  #Bright Red Blood Per Mouth, Acute, Resolved: Bright red blood power mouth with small clot 11/20. No hemorrhaging, tongue atraumatic, VSS, no pain, no wi tnessed seizure.  Initial episode 11/20, no recurrence. SLP eval shows no dysphagia, but uncomfortable with fluids.  - Consulted GI, they have signed off because family does not want aggressive management - Holding ASA and Lovenox SQ - Consider abdominal CT if abd pain worsens - Hgb stable 11.1  #Abdominal Pain, Possibly 2/2 Constipation, Improved:   Diffuse abdominal pain earlier this admission now resolved. One hard stool 11/18 with multiple  episodes of emesis 11/19 per family at bedside. ABX w/ gas throughout the abdomen including small bowel and colon w/o distention w/ non-obstructive pattern. - Colace to 200 mg QD - Miralax 17g BID  #EKG Changes, Chronic, Stable: Appears chronic in nature without any significant changes from 2015 EKG. Has ST depression in leads II, III, AVF, V4, V5. Also with T-wave inversion in II and III. RBBB.  Denies chest pain.  - Troponins all negative.  - No concern for ACS at this time.   #Generalized Weakness, Possible Encephalopathy, Improved Likely 2/2 to hyponatremia. At baseline patient able to ambulate and perform ADLs. Weakness is a symptom of hyponatremia. Strength on admission 4/4. Head CT negative for acute abnormality. A&O.  - Monitor electrolytes; replace as needed - Monitor neurological exam - passed swallow exam - PT/OT rec SNF placement, [facility]  #Hypertension, Chronic, Stable: Well-controlled.  At home, controlled with hydralazine 75 mg TID, spironolactone 50 mg qd, metoprolol 200 mg qd.  - Continue home hydralazine and hold metoprolol, hold spironolactone (diuretics can worsen hyponatremia) - Monitor BP  #Shingles, Chronic, Stable: Recent diagnosis. Was seen in ED on 11/12. Sent home with 4 days of prednisone and Valtrex for 7 days. Lesions looked healed and scabbed over. Completed steroids. Discontinued valtrex given possible side effect of hyponatremia. - hyponatremia resolved with dc valtrex - Monitor  #Leukocytosis, Acute, Resolved: Most likely 2/2 recent steriod use. No signs of infection. She is afebrile and vitals are stable. UA unremarkable. Diff 78% neutrophils, elevated16.3 K/ul neutrophil antibiodies [ref 1.7-7.7], elevated monocytes 1.7 [ref0.1-1.0]  #Osteopenia vs Osteoporosis, Chronic, Stable: - Hold home aldronate and continue oscal-D  #HLD, Chronic, Stable: Most recent lipid panel 12/2014 with results cholesterol  125, triglycerides  84, HDL 52, LDL 56, VLDL 17.  Patient on pravastatin 40 mg at home. - Continue pravastatin and holding ASA   #Hx CVA, Chronic, Stable: Some residual throat weakness reported however no other focal neurologic deficits.  On admission, neurological exam was normal. - passed swallow test  FEN/GI: Regular diet, SLIV Prophylaxis: SCDs  Disposition: SNF placement monday  Subjective:  This morning patient sleeps comfortably and is easily aroused, No complaints. Denies abdominal pain. Denies dyspnea. No acute events overnight.   Objective: Temp:  [98 F (36.7 C)-98.1 F (36.7 C)] 98 F (36.7 C) (11/25 0416) Pulse Rate:  [85-95] 85 (11/25 0416) Resp:  [18-20] 18 (11/25 0416) BP: (134-139)/(61-70) 139/70 (11/25 0416) SpO2:  [99 %-100 %] 99 % (11/25 0416) Physical Exam: General: NAD, rests comfortably in bed HEENT: oropharynx clear, no signs of blood or trauma, no pharyngeal erythema or exudate Cardiovascular: RRR, no m/r/g Respiratory: CTA bil, without W/R/R Gastrointestinal: soft, nontender, nondistended, normoactive BS MSK: moves all extremities equally Derm: right flank and RLQ abdomen with scab lesions in the T10 dermatome Neuro: CN II-XII grossly intact  Laboratory: Initial labs Lactic acid: 1.5 Phos: 2.8 Mag: 2.1 Troponin: 0.03 x3 TSH: 1.520 Cortisol: 5.9 SOsm: 242, UOsm 491 FENa: 0.8% pre-renal UA: small leuks  Recent Labs Lab 10/12/16 0228  10/12/16 2204 10/13/16 0311 10/16/16 0419  WBC 17.5*  --   --  9.3 11.1*  HGB 9.3*  < > 9.1* 9.5* 8.1*  HCT 26.8*  < > 25.7* 27.6* 24.0*  PLT 254  --   --  266 244  < > = values in this interval not displayed.  Recent Labs Lab 10/12/16 0228 10/13/16 0311 10/16/16 0419  NA 126* 132* 134*  K 3.3* 3.2* 3.8  CL 90* 96* 98*  CO2 29 27 30   BUN 37* 18 9  CREATININE 1.01* 0.84 0.75  CALCIUM 8.1* 8.2* 8.1*  GLUCOSE 89 84 91    Imaging/Diagnostic Tests: DG Abd Portable 1V (10/10/2016) FINDINGS: Gas throughout the  abdomen including small bowel and colon. No abnormal distention. Vascular calcifications and pelvic phleboliths. Scoliotic curvature of the lumbar spine with multilevel degenerative disc disease. Surgical changes of right hip arthroplasty. No displaced fracture.  IMPRESSION: Nonobstructive bowel gas pattern.  Dg Chest 2 View (10/08/2016) FINDINGS: There is chronic cardiomegaly with calcification and tortuosity of the thoracic aorta. There small chronic bilateral pleural effusions with a nodular appearing density at the right lung base, unchanged since 2016. This may represent loculated fluid. There are no discrete infiltrates. Chronic pleural thickening laterally in the left midzone. Thoracolumbar scoliosis with old compression deformities in the mid upper thoracic spine.  IMPRESSION: No acute changes. Chronic bilateral pleural effusions with chronic nodularity of the right lung base. Osteopenia with old compression fractures. Aortic atherosclerosis and chronic cardiomegaly.  Ct Head Wo Contrast (10/08/2016) FINDINGS: Brain: No evidence of acute infarction, hemorrhage, hydrocephalus, extra-axial collection or mass lesion/mass effect. There is mild diffuse low-attenuation within the subcortical and periventricular white matter compatible with chronic microvascular disease. Prominence of the sulci and ventricles noted compatible with brain atrophy. Vascular: No hyperdense vessel or unexpected calcification. Skull: Normal. Negative for fracture or focal lesion. Sinuses/Orbits: No acute finding. Other: None.  IMPRESSION: 1. No acute intracranial abnormality. 2. Chronic small vessel ischemic change and brain atrophy.    Howard PouchLauren Deidre Carino, MD 10/16/2016, 6:14 AM PGY-1, [facility] FPTS Intern pager: [phone], text pages welcome

## 2016-10-16 NOTE — Clinical Social Work Note (Signed)
CSW unable to verify patient's PASRR due to [facility] being closed for holiday. The patient cannot admit to SNF without a valid PASRR number.   Roddie McBryant Devaney Segers MSW, AhmeekLCSW, [place], [phone]

## 2016-10-17 DIAGNOSIS — J90 Pleural effusion, not elsewhere classified: Secondary | ICD-10-CM | POA: Diagnosis not present

## 2016-10-17 DIAGNOSIS — K922 Gastrointestinal hemorrhage, unspecified: Secondary | ICD-10-CM | POA: Diagnosis not present

## 2016-10-17 DIAGNOSIS — R531 Weakness: Secondary | ICD-10-CM | POA: Diagnosis not present

## 2016-10-17 DIAGNOSIS — E871 Hypo-osmolality and hyponatremia: Secondary | ICD-10-CM | POA: Diagnosis not present

## 2016-10-17 NOTE — Progress Notes (Signed)
Family Medicine Teaching Service Daily Progress Note Intern Pager: [phone]  Patient name: [name] Fry Medical record number: [id number] Date of birth: 02/26/1920 Age: 82 y.o. Gender: female  Primary Care Provider: Wilmer FloorAMPBELL, STEPHEN D., MD Consultants: Gastroenterology, Palliative Care Code Status: DNR (as of 10/12/2016, per palliative)  Pt Overview and Major Events to Date:  11/17: Admitted with hyponatremia and weakness 11/20: Pt spitting up blood with stable vitals and physical exam, transferred to SDU 11/21: Palliative meeting w/ family decided med mgmt only, no invasive studies or procedures  Assessment and Plan: [name] Fry is a 82 y.o. female presenting with generalized weakness. PMH is significant for HTN, HLD, closed right hip fracture, TIA, hx CVA, chronic anemia.   #Severe Hyponatremia, resolved: Acute on Chronic, being worked up by PCP. Na improved to 134. Considered SIADH, carcinoma, use of valtrex (hyponatremia as side effect), iatrogenic (spironolactone, held). Hyperglycemia ruled out with normal sugars. Glucocorticoid r/o cortisol normal. Renal function good and not apparent cause for hyponatremia. Valtrex for shingles d/c'd 11/20 as hyponatremia is side effect.  - Monitor for symptoms of hyponatremia - SLIV, no free water - Encourage salt with meals - intermittent monitoring  #Bright Red Blood Per Mouth, Acute, Resolved: Bright red blood power mouth with small clot 11/20. No hemorrhaging, tongue atraumatic, VSS, no pain, no wi tnessed seizure.  Initial episode 11/20, no recurrence. SLP eval shows no dysphagia, but uncomfortable with fluids.  - Consulted GI, they have signed off because family does not want aggressive management - Holding ASA and Lovenox SQ - Consider abdominal CT if abd pain worsens - Hgb stable 11.1  #Abdominal Pain, Possibly 2/2 Constipation, Improved:   Diffuse abdominal pain earlier this admission now resolved. One hard stool 11/18 with  multiple episodes of emesis 11/19 per family at bedside. ABX w/ gas throughout the abdomen including small bowel and colon w/o distention w/ non-obstructive pattern. - Colace to 200 mg QD - Miralax 17g BID  #EKG Changes, Chronic, Stable: Appears chronic in nature without any significant changes from 2015 EKG. Has ST depression in leads II, III, AVF, V4, V5. Also with T-wave inversion in II and III. RBBB.  Denies chest pain.  - Troponins all negative.  - No concern for ACS at this time.   #Generalized Weakness, Possible Encephalopathy, Improved Likely 2/2 to hyponatremia. At baseline patient able to ambulate and perform ADLs. Weakness is a symptom of hyponatremia. Strength on admission 4/4. Head CT negative for acute abnormality. A&O.  - Monitor electrolytes; replace as needed - Monitor neurological exam - passed swallow exam - PT/OT rec SNF placement  #Hypertension, Chronic, Stable: Well-controlled.  At home, controlled with hydralazine 75 mg TID, spironolactone 50 mg qd, metoprolol 200 mg qd.  - Continue home hydralazine and hold metoprolol, hold spironolactone (diuretics can worsen hyponatremia) - Monitor BP  #Shingles, Chronic, Stable: Recent diagnosis. Was seen in ED on 11/12. Sent home with 4 days of prednisone and Valtrex for 7 days. Lesions looked healed and scabbed over. Completed steroids. Discontinued valtrex given possible side effect of hyponatremia. - hyponatremia resolved with dc valtrex - Monitor  #Leukocytosis, Acute, Resolved: Most likely 2/2 recent steriod use. No signs of infection. She is afebrile and vitals are stable. UA unremarkable. Diff 78% neutrophils, elevated16.3 K/ul neutrophil antibiodies [ref 1.7-7.7], elevated monocytes 1.7 [ref0.1-1.0]  #Osteopenia vs Osteoporosis, Chronic, Stable: - Hold home aldronate and continue oscal-D  #HLD, Chronic, Stable: Most recent lipid panel 12/2014 with results cholesterol 125, triglycerides 84, HDL 52, LDL  56, VLDL 17.   Patient on pravastatin 40 mg at home. - Continue pravastatin and holding ASA   #Hx CVA, Chronic, Stable: Some residual throat weakness reported however no other focal neurologic deficits.  On admission, neurological exam was normal. - passed swallow test  FEN/GI: Regular diet, SLIV Prophylaxis: SCDs  Disposition: SNF placement monday  Subjective:  No acute events overnight. Patient without complaints this morning. Denies abdominal pain. Denies dyspnea.   Objective: Temp:  [97.8 F (36.6 C)-98.7 F (37.1 C)] 98.7 F (37.1 C) (11/25 2157) Pulse Rate:  [91] 91 (11/25 2157) Resp:  [16-18] 16 (11/25 2157) BP: (122-135)/(59-66) 122/59 (11/25 2157) SpO2:  [99 %] 99 % (11/25 2157) Physical Exam: General: NAD, rests comfortably in bed  Cardiovascular: RRR, no m/r/g Respiratory: CTA bil, without W/R/R Gastrointestinal: soft, nt, nd, normoactive BS MSK: moves all extremities equally Derm: right flank and RLQ abdomen with scab lesions in the T10 dermatome  Laboratory: Initial labs Lactic acid: 1.5 Phos: 2.8 Mag: 2.1 Troponin: 0.03 x3 TSH: 1.520 Cortisol: 5.9 SOsm: 242, UOsm 491 FENa: 0.8% pre-renal UA: small leuks  Recent Labs Lab 10/12/16 0228  10/12/16 2204 10/13/16 0311 10/16/16 0419  WBC 17.5*  --   --  9.3 11.1*  HGB 9.3*  < > 9.1* 9.5* 8.1*  HCT 26.8*  < > 25.7* 27.6* 24.0*  PLT 254  --   --  266 244  < > = values in this interval not displayed.  Recent Labs Lab 10/12/16 0228 10/13/16 0311 10/16/16 0419  NA 126* 132* 134*  K 3.3* 3.2* 3.8  CL 90* 96* 98*  CO2 29 27 30   BUN 37* 18 9  CREATININE 1.01* 0.84 0.75  CALCIUM 8.1* 8.2* 8.1*  GLUCOSE 89 84 91    Imaging/Diagnostic Tests: DG Abd Portable 1V (10/10/2016) FINDINGS: Gas throughout the abdomen including small bowel and colon. No abnormal distention. Vascular calcifications and pelvic phleboliths. Scoliotic curvature of the lumbar spine with multilevel degenerative disc disease. Surgical changes  of right hip arthroplasty. No displaced fracture.  IMPRESSION: Nonobstructive bowel gas pattern.  Dg Chest 2 View (10/08/2016) FINDINGS: There is chronic cardiomegaly with calcification and tortuosity of the thoracic aorta. There small chronic bilateral pleural effusions with a nodular appearing density at the right lung base, unchanged since 2016. This may represent loculated fluid. There are no discrete infiltrates. Chronic pleural thickening laterally in the left midzone. Thoracolumbar scoliosis with old compression deformities in the mid upper thoracic spine.  IMPRESSION: No acute changes. Chronic bilateral pleural effusions with chronic nodularity of the right lung base. Osteopenia with old compression fractures. Aortic atherosclerosis and chronic cardiomegaly.  Ct Head Wo Contrast (10/08/2016) FINDINGS: Brain: No evidence of acute infarction, hemorrhage, hydrocephalus, extra-axial collection or mass lesion/mass effect. There is mild diffuse low-attenuation within the subcortical and periventricular white matter compatible with chronic microvascular disease. Prominence of the sulci and ventricles noted compatible with brain atrophy. Vascular: No hyperdense vessel or unexpected calcification. Skull: Normal. Negative for fracture or focal lesion. Sinuses/Orbits: No acute finding. Other: None.  IMPRESSION: 1. No acute intracranial abnormality. 2. Chronic small vessel ischemic change and brain atrophy.    Howard PouchLauren Deaun Rocha, MD 10/17/2016, 8:00 AM PGY-1, [facility] FPTS Intern pager: [phone], text pages welcome

## 2016-10-18 DIAGNOSIS — R1310 Dysphagia, unspecified: Secondary | ICD-10-CM | POA: Diagnosis not present

## 2016-10-18 DIAGNOSIS — M810 Age-related osteoporosis without current pathological fracture: Secondary | ICD-10-CM | POA: Diagnosis not present

## 2016-10-18 DIAGNOSIS — K922 Gastrointestinal hemorrhage, unspecified: Secondary | ICD-10-CM | POA: Diagnosis not present

## 2016-10-18 DIAGNOSIS — R531 Weakness: Secondary | ICD-10-CM | POA: Diagnosis not present

## 2016-10-18 DIAGNOSIS — R2689 Other abnormalities of gait and mobility: Secondary | ICD-10-CM | POA: Diagnosis not present

## 2016-10-18 DIAGNOSIS — E871 Hypo-osmolality and hyponatremia: Secondary | ICD-10-CM | POA: Diagnosis not present

## 2016-10-18 DIAGNOSIS — R279 Unspecified lack of coordination: Secondary | ICD-10-CM | POA: Diagnosis not present

## 2016-10-18 DIAGNOSIS — M1990 Unspecified osteoarthritis, unspecified site: Secondary | ICD-10-CM | POA: Diagnosis not present

## 2016-10-18 DIAGNOSIS — I10 Essential (primary) hypertension: Secondary | ICD-10-CM | POA: Diagnosis not present

## 2016-10-18 DIAGNOSIS — M6281 Muscle weakness (generalized): Secondary | ICD-10-CM | POA: Diagnosis not present

## 2016-10-18 MED ORDER — PANTOPRAZOLE SODIUM 40 MG PO TBEC: 40.0000 mg | DELAYED_RELEASE_TABLET | Freq: Two times a day (BID) | ORAL | Status: DC

## 2016-10-18 MED ORDER — PANTOPRAZOLE SODIUM 40 MG PO TBEC: 40.0000 mg | DELAYED_RELEASE_TABLET | Freq: Two times a day (BID) | ORAL | 0 refills | Status: DC

## 2016-10-18 NOTE — Clinical Social Work Placement (Signed)
   CLINICAL SOCIAL WORK PLACEMENT  NOTE  Date:  10/18/2016  Patient Details  Name: [name] Fry MRN: [id number] Date of Birth: [birth year]  Clinical Social Work is seeking post-discharge placement for this patient at the Skilled  Nursing Facility level of care (*CSW will initial, date and re-position this form in  chart as items are completed):  Yes   Patient/family provided with [facility] Clinical Social Work Department's list of facilities offering this level of care within the geographic area requested by the patient (or if unable, by the patient's family).  Yes   Patient/family informed of their freedom to choose among providers that offer the needed level of care, that participate in Medicare, Medicaid or managed care program needed by the patient, have an available bed and are willing to accept the patient.  Yes   Patient/family informed of [facility] ownership interest in [facility] and [facility], as well as of the fact that they are under no obligation to receive care at these facilities.  PASRR submitted to EDS on       PASRR number received on       Existing PASRR number confirmed on 10/18/16     FL2 transmitted to all facilities in geographic area requested by pt/family on 10/18/16     FL2 transmitted to all facilities within larger geographic area on       Patient informed that his/her managed care company has contracts with or will negotiate with certain facilities, including the following:        Yes   Patient/family informed of bed offers received.  Patient chooses bed at [facility] [place]     Physician recommends and patient chooses bed at      Patient to be transferred to [facility] [place] on 10/18/16.  Patient to be transferred to facility by [facility]     Patient family notified on 10/18/16 of transfer.  Name of family member notified:  Daughter     PHYSICIAN Please sign FL2     Additional Comment:     _______________________________________________ Mearl LatinNadia S Markeis Allman, LCSWA 10/18/2016, 3:11 PM

## 2016-10-18 NOTE — Progress Notes (Signed)
Physical Therapy Treatment Patient Details Name: [name] Fry MRN: [id number] DOB: September 24, 1920 Today's Date: 10/18/2016    History of Present Illness Patient is a 82 y/o famele with hx of HTN, HLD, CVA presents with weakness. Found to have Hyponatremia with chronic worsening likely due to hypotonic fluid intake with diuretics and possible SIADH.     PT Comments    Pt beginning to show steady progress with all mobility. Expect pt will continue to progress slowly but steadily.   Follow Up Recommendations  SNF     Equipment Recommendations  None recommended by PT    Recommendations for Other Services       Precautions / Restrictions Precautions Precautions: Fall Restrictions Weight Bearing Restrictions: No    Mobility  Bed Mobility Overal bed mobility: Needs Assistance Bed Mobility: Supine to Sit;Sit to Supine     Supine to sit: Mod assist Sit to supine: Max assist   General bed mobility comments: Assist to elevate trunk into sitting and bring hips to EOB. Assist to lower trunk and bring legs up into bed.  Transfers Overall transfer level: Needs assistance Equipment used: Rolling walker (2 wheeled) Transfers: Sit to/from Stand Sit to Stand: Max assist;Mod assist         General transfer comment: assist to bring hips and trunk up. Had pt place hands on walker to facilitate anterior wt shift. Pt max A on initial sit to stand and then mod A on subsequent ones. Performed sit to stand x 4.   Ambulation/Gait Ambulation/Gait assistance: Mod assist Ambulation Distance (Feet): 3 Feet (3' forward/backward x 2 with seated rest break) Assistive device: Rolling walker (2 wheeled) Gait Pattern/deviations: Step-to pattern;Decreased step length - right;Decreased step length - left;Shuffle;Trunk flexed;Leaning posteriorly Gait velocity: decr Gait velocity interpretation: <1.8 ft/sec, indicative of risk for recurrent falls General Gait Details: Assist for balance and support  and to move walker. Verbal cues to stand more erect   Stairs            Wheelchair Mobility    Modified Rankin (Stroke Patients Only)       Balance Overall balance assessment: Needs assistance Sitting-balance support: Feet supported;No upper extremity supported Sitting balance-Leahy Scale: Fair     Standing balance support: Bilateral upper extremity supported Standing balance-Leahy Scale: Poor Standing balance comment: walker and min A for static standing. Posterior lean                    Cognition Arousal/Alertness: Awake/alert Behavior During Therapy: WFL for tasks assessed/performed Overall Cognitive Status: Within Functional Limits for tasks assessed               Problem Solving: Slow processing      Exercises      General Comments        Pertinent Vitals/Pain Pain Assessment: No/denies pain    Home Living                      Prior Function            PT Goals (current goals can now be found in the care plan section) Acute Rehab PT Goals Patient Stated Goal: none stated Progress towards PT goals: Progressing toward goals    Frequency    Min 2X/week      PT Plan Current plan remains appropriate;Frequency needs to be updated    Co-evaluation             End of Session Equipment Utilized  During Treatment: Gait belt Activity Tolerance: Patient tolerated treatment well Patient left: in bed;with call bell/phone within reach;with bed alarm set;with family/visitor present     Time: 1610-96041223-1245 PT Time Calculation (min) (ACUTE ONLY): 22 min  Charges:  $Therapeutic Activity: 8-22 mins                    G CodesAngelina Fry:      [name] Fry [name] Fry 10/18/2016, 1:26 PM [employer] CorporationCary Neriah Fry PT [phone]

## 2016-10-18 NOTE — Progress Notes (Signed)
Pasrr obtained: [id number]  [name] Fry [name] LCSWA [phone]

## 2016-10-18 NOTE — Discharge Summary (Signed)
Family Medicine Teaching [facility] Discharge Summary  Patient name: [name] Fry E Flow Medical record number: [id number] Date of birth: Feb 21, 1920 Age: 82 y.o. Gender: female Date of Admission: 10/08/2016  Date of Discharge: 10/18/2016 Admitting Physician: Carney LivingMarshall L Chambliss, MD  Primary Care Provider: Wilmer FloorAMPBELL, STEPHEN D., MD Consultants: Gastroenterology, Palliative Care  Indication for Hospitalization: Severe hyponatremia  Discharge Diagnoses/Problem List:  Severe Hyponatremia Bright Red Blood Per Mouth Abdominal Pain Generalized Weakness Hypertension Shingles Unspecified lung nodules  Disposition: SNF   Discharge Condition: Stable, improved  Discharge Exam:  General: frail elderly woman, well nourished, well developed, in no acute distress with non-toxic appearance HEENT: normocephalic, atraumatic, moist mucous membranes CV: regular rate and rhythm without murmurs, rubs, or gallops Lungs: clear to auscultation bilaterally with normal work of breathing Abdomen: soft, non-tender, normoactive bowel sounds Skin: warm, dry, cap refill < 2 seconds, right flank and RLQ abdomen with scab lesions in the T10 dermatome Extremities: warm and well perfused, normal tone, moves all extremities equally  Brief Hospital Course:  [name] Fry a 82 y.o.femalepresenting with generalized weakness. PMH is significant for HTN, HLD, closed right hip fracture, TIA, hx CVA, chronic anemia.  Patient was admitted for weakness and was found to be in severe hyponatremia as low as 113. We restricted water intake and discontinued the recent Valtrex for her shingles earlier this month which has a side effect of hyponatremia. This helped and patients sodium level returned to stable levels. She also experience and episode of acute GI bleeding from mouth which resolved with an improved and stable hgb. GI was consulted and concerning bleed and rec a EGD if worsened. Palliative was consulted given state  of patient health and situation. Family were in agreement. Decision was made to continue medical management but hold off on EGD, blood transfusion, invasive procedures or interventions. Hgb improved and remained stable w/o recurrent bleeding. Patient passed swallow evaluation. PT and OT rec SNF placement. Patient deemed stable for d/c.   Issues for Follow Up:  1. Palliative recs avoid invasive procedures or interventions 2. Swallow evaluation passed, could eat soft diet or full liquids based on pts tolerance 3. Acute findings for lung nodules concerning for cancer given SIADH, however family does not want work up 4. Avoid excessive water intake given h/o SIADH 5. Pt at risk for falls with profound weakness 6. Given acute GI bleed, avoid ASA and blood thinners 7. Spironolactone held, restart if appropriate at PCP f/u 8. Continue home hydralazine and hold metoprolol given normotensive BP 9. SNF   Significant Procedures: None  Significant Labs and Imaging:  Lactic acid: 1.5 Phos: 2.8 Mag: 2.1 Troponin: 0.03 x3 TSH: 1.520 Cortisol: 5.9 SOsm: 242, UOsm 491 FENa: 0.8% pre-renal UA: small leuks  Recent Labs Lab 10/12/16 0228  10/12/16 2204 10/13/16 0311 10/16/16 0419  WBC 17.5*  --   --  9.3 11.1*  HGB 9.3*  < > 9.1* 9.5* 8.1*  HCT 26.8*  < > 25.7* 27.6* 24.0*  PLT 254  --   --  266 244  < > = values in this interval not displayed.  Recent Labs Lab 10/12/16 0228 10/12/16 0946 10/13/16 0311 10/16/16 0419  NA 126*  --  132* 134*  K 3.3*  --  3.2* 3.8  CL 90*  --  96* 98*  CO2 29  --  27 30  GLUCOSE 89  --  84 91  BUN 37*  --  18 9  CREATININE 1.01*  --  0.84 0.75  CALCIUM 8.1*  --  8.2* 8.1*  ALBUMIN  --  2.2*  --   --    Imaging/Diagnostic Tests: DG Abd Portable 1V (10/10/2016) FINDINGS: Gas throughout the abdomen including small bowel and colon. No abnormal distention. Vascular calcifications and pelvic phleboliths. Scoliotic curvature of the lumbar spine with  multilevel degenerative disc disease. Surgical changes of right hip arthroplasty. No displaced fracture.  IMPRESSION: Nonobstructive bowel gas pattern.  Dg Chest 2 View (10/08/2016) FINDINGS: There is chronic cardiomegaly with calcification and tortuosity of the thoracic aorta. There small chronic bilateral pleural effusions with a nodular appearing density at the right lung base, unchanged since 2016. This may represent loculated fluid. There are no discrete infiltrates. Chronic pleural thickening laterally in the left midzone. Thoracolumbar scoliosis with old compression deformities in the mid upper thoracic spine.  IMPRESSION: No acute changes. Chronic bilateral pleural effusions with chronic nodularity of the right lung base. Osteopenia with old compression fractures. Aortic atherosclerosis and chronic cardiomegaly.  Ct Head Wo Contrast (10/08/2016) FINDINGS: Brain: No evidence of acute infarction, hemorrhage, hydrocephalus, extra-axial collection or mass lesion/mass effect. There is mild diffuse low-attenuation within the subcortical and periventricular white matter compatible with chronic microvascular disease. Prominence of the sulci and ventricles noted compatible with brain atrophy. Vascular: No hyperdense vessel or unexpected calcification. Skull: Normal. Negative for fracture or focal lesion. Sinuses/Orbits: No acute finding. Other: None.  IMPRESSION: 1. No acute intracranial abnormality. 2. Chronic small vessel ischemic change and brain atrophy.   Results/Tests Pending at Time of Discharge: None  Discharge Medications:    Medication List    STOP taking these medications   aspirin 325 MG tablet   aspirin EC 81 MG tablet   HYDROcodone-acetaminophen 5-325 MG tablet Commonly known as:  NORCO   metoprolol 200 MG 24 hr tablet Commonly known as:  TOPROL-XL   predniSONE 20 MG tablet Commonly known as:  DELTASONE   promethazine 25 MG tablet Commonly known as:   PHENERGAN   sorbitol 70 % Soln   spironolactone 50 MG tablet Commonly known as:  ALDACTONE   valACYclovir 1000 MG tablet Commonly known as:  VALTREX     TAKE these medications   acetaminophen 325 MG tablet Commonly known as:  TYLENOL Take 650 mg by mouth every 6 (six) hours as needed for mild pain or moderate pain.   alendronate 70 MG tablet Commonly known as:  FOSAMAX Take 70 mg by mouth every Wednesday. Take with a full glass of water on an empty stomach.   ANTI ITCH EX Apply 1 application topically as needed (for itching).   calcium-vitamin D 500-200 MG-UNIT tablet Commonly known as:  OSCAL WITH D Take 1 tablet by mouth daily with breakfast. What changed:  when to take this   feeding supplement (ENSURE COMPLETE) Liqd Take 237 mLs by mouth 2 (two) times daily between meals.   hydrALAZINE 50 MG tablet Commonly known as:  APRESOLINE Take 1.5 tablets (75 mg total) by mouth 3 (three) times daily.   loratadine 10 MG tablet Commonly known as:  CLARITIN Take 10 mg by mouth daily.   magnesium hydroxide 400 MG/5ML suspension Commonly known as:  MILK OF MAGNESIA Take 30 mLs by mouth daily as needed for mild constipation.   ondansetron 4 MG tablet Commonly known as:  ZOFRAN Take 1 tablet (4 mg total) by mouth every 6 (six) hours.   pantoprazole 40 MG tablet Commonly known as:  PROTONIX Take 1 tablet (40 mg total) by mouth 2 (two) times  daily. What changed:  medication strength  how much to take  when to take this   pantoprazole 40 MG tablet Commonly known as:  PROTONIX Take 1 tablet (40 mg total) by mouth 2 (two) times daily. What changed:  You were already taking a medication with the same name, and this prescription was added. Make sure you understand how and when to take each.   polyethylene glycol packet Commonly known as:  MIRALAX / GLYCOLAX Take 17 g by mouth daily as needed for mild constipation.   pravastatin 40 MG tablet Commonly known as:   PRAVACHOL Take 40 mg by mouth daily.   senna-docusate 8.6-50 MG tablet Commonly known as:  SENOKOT S Take 1 tablet by mouth at bedtime as needed.       Discharge Instructions: Please refer to Patient Instructions section of EMR for full details.  Patient was counseled important signs and symptoms that should prompt return to medical care, changes in medications, dietary instructions, activity restrictions, and follow up appointments.   Follow-Up Appointments:  Contact information for follow-up providers    HAMRICK,[name], MD. Schedule an appointment as soon as possible for a visit.   Specialty:  Family Medicine Why:  Please call and schedule hospital follow up next week. Contact information: [address] [place] [address] [phone]            Contact information for after-discharge care    Destination    [facility] .   Specialty:  Skilled Nursing Facility Contact information: [address] [place] [id number] [phone]                  Tillman Sersngela C Katarzyna Wolven, DO 10/18/2016, 2:00 PM PGY-1, [facility]

## 2016-10-18 NOTE — Progress Notes (Signed)
Occupational Therapy Treatment Patient Details Name: [name] Fry MRN: [id number] DOB: November 16, 1920 Today's Date: 10/18/2016    History of present illness Patient is a 82 y/o famele with hx of HTN, HLD, CVA presents with weakness. Found to have Hyponatremia with chronic worsening likely due to hypotonic fluid intake with diuretics and possible SIADH.    OT comments  Upon entering the room, pt supine in bed with family feeding her. Pt agreeable to therapist assist her to transfer to chair in order to finish meal in seated upright position for safety. Pt required max A for B LEs and trunk to come to EOB. Sit <>stand from bed with max A for lifting and lowering. Stand pivot transfer with max A to bed. Pt fatigues very easily. OT positioned tray in front on pt and encouraged her to feed self. Family present in room if pt does need assistance. Chair alarm activated and call bell placed within reach upon exiting the room.   Follow Up Recommendations  SNF;Supervision/Assistance - 24 hour    Equipment Recommendations  Other (comment) (defer to next venue of care)    Recommendations for Other Services      Precautions / Restrictions Precautions Precautions: Fall Restrictions Weight Bearing Restrictions: No       Mobility Bed Mobility Overal bed mobility: Needs Assistance Bed Mobility: Supine to Sit     Supine to sit: Max assist Sit to supine: Max assist   General bed mobility comments: Pt needing requiring assistance with trunk and getting B LEs off EOB for supine >sit.  Transfers Overall transfer level: Needs assistance Equipment used: None Transfers: Sit to/from [employer] CorporationStand;Stand Pivot Transfers Sit to Stand: Max assist Stand pivot transfers: Max assist       General transfer comment: Pt needing lifting and lowering assistance for sit <>stand.     Balance Overall balance assessment: Needs assistance Sitting-balance support: Feet supported;No upper extremity supported Sitting  balance-Leahy Scale: Fair     Standing balance support: Bilateral upper extremity supported Standing balance-Leahy Scale: Poor Standing balance comment: posterior lean in standing                   ADL       General ADL Comments: Pt with set up for lunch and family present in the room if pt needs further assistance.                 Cognition   Behavior During Therapy: WFL for tasks assessed/performed Overall Cognitive Status: Within Functional Limits for tasks assessed                Problem Solving: Slow processing                   Pertinent Vitals/ Pain       Pain Assessment: No/denies pain         Frequency  Min 2X/week        Progress Toward Goals  OT Goals(current goals can now be found in the care plan section)  Progress towards OT goals: Progressing toward goals  Acute Rehab OT Goals Patient Stated Goal: none stated  Plan Discharge plan remains appropriate       End of Session     Activity Tolerance Patient limited by fatigue   Patient Left in chair;with chair alarm set;with call bell/phone within reach;with family/visitor present   Nurse Communication          Time: 1325-1340 OT Time Calculation (min): 15 min  Charges: OT General Charges $OT Visit: 1 Procedure OT Treatments $Self Care/Home Management : 8-22 mins  Alen BleacherBradsher, Mee Macdonnell P 10/18/2016, 1:48 PM

## 2016-10-18 NOTE — Progress Notes (Signed)
Patient will DC to: [facility] Anticipated DC date: 10/18/16 Family notified: Daughter Transport by: [facility] 4pm   Per MD patient ready for DC to [facility]. RN, patient, patient's family, and facility notified of DC. Discharge Summary sent to facility. RN given number for report. DC packet on chart. Ambulance transport requested for patient.   CSW signing off.  Cristobal GoldmannNadia Aleria Maheu, [place] Clinical Social Worker [phone]

## 2016-10-18 NOTE — Clinical Social Work Note (Signed)
Clinical Social Work Assessment  Patient Details  Name: [name] Fry MRN: [id number] Date of Birth: 10-07-1920  Date of referral:  10/07/16               Reason for consult:  Facility Placement                Permission sought to share information with:  Facility Sport and exercise psychologist, Family Supports Permission granted to share information::  Yes, Verbal Permission Granted  Name::     Engineer, petroleum::  SNFs  Relationship::  Daughter  Contact Information:  [phone]  Housing/Transportation Living arrangements for the past 2 months:  Single Family Home Source of Information:  Adult Children Patient Interpreter Needed:  None Criminal Activity/Legal Involvement Pertinent to Current Situation/Hospitalization:  No - Comment as needed Significant Relationships:  Adult Children Lives with:  Adult Children Do you feel safe going back to the place where you live?  No Need for family participation in patient care:  Yes (Comment)  Care giving concerns:  CSW received consult for possible SNF placement at time of discharge. CSW met with patient and patient's daughter regarding PT recommendation of SNF placement at time of discharge. Per patient's daughter, they are currently unable to care for patient at their home given patient's current physical needs and fall risk. Patient and patient's daughter expressed understanding of PT recommendation and are agreeable to SNF placement at time of discharge. CSW to continue to follow and assist with discharge planning needs.   Social Worker assessment / plan:  CSW spoke with patient and patient's daughter concerning possibility of rehab at [facility] before returning home.  Employment status:  Retired Nurse, adult PT Recommendations:  [place] / Referral to community resources:  Admire  Patient/Family's Response to care:  Patient and patient's daughter recognize need for rehab  before returning home and are agreeable to a SNF in [employer]. Patient reported preference for [facility]. Patient's family is hopeful that patient can regain her strength.  Patient/Family's Understanding of and Emotional Response to Diagnosis, Current Treatment, and Prognosis:  Patient/family is realistic regarding therapy needs and expressed being hopeful for SNF placement. Patient's daughter expressed understanding of CSW role and discharge process. No questions/concerns about plan or treatment.    Emotional Assessment Appearance:  Appears stated age Attitude/Demeanor/Rapport:  Other (Appropriate) Affect (typically observed):  Accepting, Appropriate Orientation:  Oriented to Self, Oriented to Place Alcohol / Substance use:  Not Applicable Psych involvement (Current and /or in the community):  No (Comment)  Discharge Needs  Concerns to be addressed:  Care Coordination Readmission within the last 30 days:  No Current discharge risk:  Dependent with Mobility Barriers to Discharge:  No Barriers Identified   Benard Halsted, [place] 10/18/2016, 8:51 AM

## 2016-10-18 NOTE — Progress Notes (Signed)
Family Medicine Teaching Service Daily Progress Note Intern Pager: [phone]  Patient name: [name] Fry Medical record number: [id number] Date of birth: [birth year] Age: 82 y.o. Gender: female  Primary Care Provider: Wilmer FloorAMPBELL, STEPHEN D., MD Consultants: Gastroenterology, Palliative Care Code Status: DNR (as of 10/12/2016, per palliative)  Pt Overview and Major Events to Date:  11/17: Admitted with hyponatremia and weakness 11/20: Pt spitting up blood with stable vitals and physical exam, transferred to SDU 11/21: Palliative meeting w/ family decided med mgmt only, no invasive studies or procedures  Assessment and Plan: [name] Fry is a 82 y.o. female presenting with generalized weakness. PMH is significant for HTN, HLD, closed right hip fracture, TIA, hx CVA, chronic anemia.   #Severe Hyponatremia, Acute, Resolved:  Acute on Chronic, being worked up by PCP. Na improved to 134. Considered SIADH, carcinoma, use of valtrex (hyponatremia as side effect), iatrogenic (spironolactone, held). Hyperglycemia ruled out with normal sugars. Glucocorticoid r/o cortisol normal. Renal function good and not apparent cause for hyponatremia. Valtrex for shingles d/c'd 11/20 as hyponatremia is side effect.  --Monitor for symptoms of hyponatremia --SLIV, no free water --Encourage salt with meals --Intermittent monitoring  #Bright Red Blood Per Mouth, Acute, Resolved:  Bright red blood power mouth with small clot 11/20. No hemorrhaging, tongue atraumatic, VSS, no pain, no witnessed seizure. Initial episode 11/20, no recurrence. SLP eval shows no dysphagia, but uncomfortable with fluids.  --Consulted GI, they have signed off because family does not want aggressive management --Holding ASA and Lovenox SQ --Consider abdominal CT if abd pain worsens --Hgb stable 11.1  #Abdominal Pain, Possibly 2/2 Constipation, Improved:  Diffuse abdominal pain earlier this admission now resolved. One hard stool 11/18  with multiple episodes of emesis 11/19 per family at bedside. ABX w/ gas throughout the abdomen including small bowel and colon w/o distention w/ non-obstructive pattern. --Colace to 200 mg QD --Miralax 17g BID  #EKG Changes, Chronic, Stable:  Appears chronic in nature without any significant changes from 2015 EKG. Has ST depression in leads II, III, AVF, V4, V5. Also with T-wave inversion in II and III. RBBB.  Denies chest pain.  --Troponins all negative.  --No concern for ACS at this time.   #Generalized Weakness, Possible Encephalopathy, Improved:  Likely 2/2 to hyponatremia. At baseline patient able to ambulate and perform ADLs. Weakness is a symptom of hyponatremia. Strength on admission 4/4. Head CT negative for acute abnormality. A&O.  --Monitor electrolytes; replace as needed --Monitor neurological exam --Passed swallow exam --PT/OT rec SNF placement  #Hypertension, Chronic, Stable:  Well-controlled.  At home, controlled with hydralazine 75 mg TID, spironolactone 50 mg qd, metoprolol 200 mg qd.  --Continue home hydralazine and hold metoprolol, hold spironolactone (diuretics can worsen hyponatremia) --Monitor BP  #Shingles, Chronic, Stable:  Recent diagnosis. Was seen in ED on 11/12. Sent home with 4 days of prednisone and Valtrex for 7 days. Lesions looked healed and scabbed over. Completed steroids. Discontinued valtrex given possible side effect of hyponatremia. --Hyponatremia resolved with dc valtrex --Monitor  #Leukocytosis, Acute, Resolved:  Most likely 2/2 recent steriod use. No signs of infection. She is afebrile and vitals are stable. UA unremarkable. Diff 78% neutrophils, elevated16.3 K/ul neutrophil antibiodies [ref 1.7-7.7], elevated monocytes 1.7 [ref0.1-1.0]  #Osteopenia vs Osteoporosis, Chronic, Stable: --Hold home aldronate and continue oscal-D  #HLD, Chronic, Stable:  Most recent lipid panel 12/2014 with results cholesterol 125, triglycerides 84, HDL 52,  LDL 56, VLDL 17.  Patient on pravastatin 40 mg at home. --Continue pravastatin and  holding ASA   #Hx CVA, Chronic, Stable:  Some residual throat weakness reported however no other focal neurologic deficits.  On admission, neurological exam was normal. --Passed swallow test  FEN/GI: Regular diet, SLIV Prophylaxis: SCDs  Disposition: Pending SNF placement, anticipate d/c 11/27 if accepted.  Subjective:  No acute events overnight. Patient without complaints this morning. Denies fevers, CP, SOB, n/v, abdominal pain. Says she is ready to leave.  Objective: Temp:  [97.7 F (36.5 C)-98.6 F (37 C)] 97.7 F (36.5 C) (11/27 0434) Pulse Rate:  [86-94] 86 (11/27 0434) Resp:  [18] 18 (11/27 0434) BP: (128-141)/(62-69) 141/62 (11/27 0434) SpO2:  [100 %] 100 % (11/27 0434) Physical Exam: General: frail elderly woman, well nourished, well developed, in no acute distress with non-toxic appearance HEENT: normocephalic, atraumatic, moist mucous membranes CV: regular rate and rhythm without murmurs, rubs, or gallops Lungs: clear to auscultation bilaterally with normal work of breathing Abdomen: soft, non-tender, normoactive bowel sounds Skin: warm, dry, cap refill < 2 seconds, right flank and RLQ abdomen with scab lesions in the T10 dermatome Extremities: warm and well perfused, normal tone, moves all extremities equally  Laboratory: Initial labs Lactic acid: 1.5 Phos: 2.8 Mag: 2.1 Troponin: 0.03 x3 TSH: 1.520 Cortisol: 5.9 SOsm: 242, UOsm 491 FENa: 0.8% pre-renal UA: small leuks  Recent Labs Lab 10/12/16 0228  10/12/16 2204 10/13/16 0311 10/16/16 0419  WBC 17.5*  --   --  9.3 11.1*  HGB 9.3*  < > 9.1* 9.5* 8.1*  HCT 26.8*  < > 25.7* 27.6* 24.0*  PLT 254  --   --  266 244  < > = values in this interval not displayed.  Recent Labs Lab 10/12/16 0228 10/13/16 0311 10/16/16 0419  NA 126* 132* 134*  K 3.3* 3.2* 3.8  CL 90* 96* 98*  CO2 29 27 30   BUN 37* 18 9  CREATININE  1.01* 0.84 0.75  CALCIUM 8.1* 8.2* 8.1*  GLUCOSE 89 84 91    Imaging/Diagnostic Tests: DG Abd Portable 1V (10/10/2016) FINDINGS: Gas throughout the abdomen including small bowel and colon. No abnormal distention. Vascular calcifications and pelvic phleboliths. Scoliotic curvature of the lumbar spine with multilevel degenerative disc disease. Surgical changes of right hip arthroplasty. No displaced fracture.  IMPRESSION: Nonobstructive bowel gas pattern.  Dg Chest 2 View (10/08/2016) FINDINGS: There is chronic cardiomegaly with calcification and tortuosity of the thoracic aorta. There small chronic bilateral pleural effusions with a nodular appearing density at the right lung base, unchanged since 2016. This may represent loculated fluid. There are no discrete infiltrates. Chronic pleural thickening laterally in the left midzone. Thoracolumbar scoliosis with old compression deformities in the mid upper thoracic spine.  IMPRESSION: No acute changes. Chronic bilateral pleural effusions with chronic nodularity of the right lung base. Osteopenia with old compression fractures. Aortic atherosclerosis and chronic cardiomegaly.  Ct Head Wo Contrast (10/08/2016) FINDINGS: Brain: No evidence of acute infarction, hemorrhage, hydrocephalus, extra-axial collection or mass lesion/mass effect. There is mild diffuse low-attenuation within the subcortical and periventricular white matter compatible with chronic microvascular disease. Prominence of the sulci and ventricles noted compatible with brain atrophy. Vascular: No hyperdense vessel or unexpected calcification. Skull: Normal. Negative for fracture or focal lesion. Sinuses/Orbits: No acute finding. Other: None.  IMPRESSION: 1. No acute intracranial abnormality. 2. Chronic small vessel ischemic change and brain atrophy.     Wendee Beaversavid J Temitayo Covalt, DO 10/18/2016, 8:26 AM PGY-1, [facility] FPTS Intern pager: [phone], text pages  welcome

## 2016-10-18 NOTE — Care Management Important Message (Signed)
Important Message  Patient Details  Name: Barkley Brunslice E [place] MRN: [id number] Date of Birth: [birth year]   Medicare Important Message Given:  Yes    Kyla BalzarineShealy, Naphtali Zywicki Abena 10/18/2016, 12:07 PM

## 2016-10-19 NOTE — Consult Note (Signed)
            [facility]  10/19/2016  Barkley Brunslice E Dancer [birth year] [id number]   Wentto see patient at the bedside to identify possible discharge needs butpatient was already discharged per staff.  Primary care provider's office contacted Selena Batten(Kim) and message left to notifyof patient's discharge to skilled nursing facility [facility]; and need for post SNF follow-up and transition of care.  For additional questions please contact:  Karin GoldenLorraine A. Gladies Sofranko, BSN, RN-BC [facility] PR[facility] Navigator Cell: [phone]

## 2016-10-22 DIAGNOSIS — M6281 Muscle weakness (generalized): Secondary | ICD-10-CM | POA: Diagnosis not present

## 2016-10-22 DIAGNOSIS — E784 Other hyperlipidemia: Secondary | ICD-10-CM | POA: Diagnosis not present

## 2016-10-22 DIAGNOSIS — I10 Essential (primary) hypertension: Secondary | ICD-10-CM | POA: Diagnosis not present

## 2016-10-22 DIAGNOSIS — E871 Hypo-osmolality and hyponatremia: Secondary | ICD-10-CM | POA: Diagnosis not present

## 2016-10-22 DIAGNOSIS — R279 Unspecified lack of coordination: Secondary | ICD-10-CM | POA: Diagnosis not present

## 2016-10-22 DIAGNOSIS — M1990 Unspecified osteoarthritis, unspecified site: Secondary | ICD-10-CM | POA: Diagnosis not present

## 2016-10-22 DIAGNOSIS — R911 Solitary pulmonary nodule: Secondary | ICD-10-CM | POA: Diagnosis not present

## 2016-10-22 DIAGNOSIS — R1310 Dysphagia, unspecified: Secondary | ICD-10-CM | POA: Diagnosis not present

## 2016-10-22 DIAGNOSIS — R2689 Other abnormalities of gait and mobility: Secondary | ICD-10-CM | POA: Diagnosis not present

## 2016-10-22 DIAGNOSIS — M810 Age-related osteoporosis without current pathological fracture: Secondary | ICD-10-CM | POA: Diagnosis not present

## 2016-10-22 DIAGNOSIS — D6489 Other specified anemias: Secondary | ICD-10-CM | POA: Diagnosis not present

## 2016-10-24 DIAGNOSIS — R2689 Other abnormalities of gait and mobility: Secondary | ICD-10-CM | POA: Diagnosis not present

## 2016-10-24 DIAGNOSIS — R911 Solitary pulmonary nodule: Secondary | ICD-10-CM | POA: Diagnosis not present

## 2016-10-24 DIAGNOSIS — D6489 Other specified anemias: Secondary | ICD-10-CM | POA: Diagnosis not present

## 2016-10-24 DIAGNOSIS — E871 Hypo-osmolality and hyponatremia: Secondary | ICD-10-CM | POA: Diagnosis not present

## 2016-10-24 DIAGNOSIS — I10 Essential (primary) hypertension: Secondary | ICD-10-CM | POA: Diagnosis not present

## 2016-10-24 DIAGNOSIS — E784 Other hyperlipidemia: Secondary | ICD-10-CM | POA: Diagnosis not present

## 2016-10-24 DIAGNOSIS — M810 Age-related osteoporosis without current pathological fracture: Secondary | ICD-10-CM | POA: Diagnosis not present

## 2016-11-07 DIAGNOSIS — K219 Gastro-esophageal reflux disease without esophagitis: Secondary | ICD-10-CM | POA: Diagnosis not present

## 2016-11-07 DIAGNOSIS — M6281 Muscle weakness (generalized): Secondary | ICD-10-CM | POA: Diagnosis not present

## 2016-11-07 DIAGNOSIS — R269 Unspecified abnormalities of gait and mobility: Secondary | ICD-10-CM | POA: Diagnosis not present

## 2016-11-07 DIAGNOSIS — E871 Hypo-osmolality and hyponatremia: Secondary | ICD-10-CM | POA: Diagnosis not present

## 2016-11-07 DIAGNOSIS — I10 Essential (primary) hypertension: Secondary | ICD-10-CM | POA: Diagnosis not present

## 2016-11-07 DIAGNOSIS — M810 Age-related osteoporosis without current pathological fracture: Secondary | ICD-10-CM | POA: Diagnosis not present

## 2016-11-10 DIAGNOSIS — E871 Hypo-osmolality and hyponatremia: Secondary | ICD-10-CM | POA: Diagnosis not present

## 2016-11-10 DIAGNOSIS — R269 Unspecified abnormalities of gait and mobility: Secondary | ICD-10-CM | POA: Diagnosis not present

## 2016-11-10 DIAGNOSIS — K219 Gastro-esophageal reflux disease without esophagitis: Secondary | ICD-10-CM | POA: Diagnosis not present

## 2016-11-10 DIAGNOSIS — M810 Age-related osteoporosis without current pathological fracture: Secondary | ICD-10-CM | POA: Diagnosis not present

## 2016-11-10 DIAGNOSIS — I10 Essential (primary) hypertension: Secondary | ICD-10-CM | POA: Diagnosis not present

## 2016-11-10 DIAGNOSIS — M6281 Muscle weakness (generalized): Secondary | ICD-10-CM | POA: Diagnosis not present

## 2016-11-16 DIAGNOSIS — Z6821 Body mass index (BMI) 21.0-21.9, adult: Secondary | ICD-10-CM | POA: Diagnosis not present

## 2016-11-16 DIAGNOSIS — E871 Hypo-osmolality and hyponatremia: Secondary | ICD-10-CM | POA: Diagnosis not present

## 2016-11-16 DIAGNOSIS — I10 Essential (primary) hypertension: Secondary | ICD-10-CM | POA: Diagnosis not present

## 2016-11-16 DIAGNOSIS — R609 Edema, unspecified: Secondary | ICD-10-CM | POA: Diagnosis not present

## 2016-11-24 DIAGNOSIS — M6281 Muscle weakness (generalized): Secondary | ICD-10-CM | POA: Diagnosis not present

## 2016-11-24 DIAGNOSIS — K219 Gastro-esophageal reflux disease without esophagitis: Secondary | ICD-10-CM | POA: Diagnosis not present

## 2016-11-24 DIAGNOSIS — E871 Hypo-osmolality and hyponatremia: Secondary | ICD-10-CM | POA: Diagnosis not present

## 2016-11-24 DIAGNOSIS — I10 Essential (primary) hypertension: Secondary | ICD-10-CM | POA: Diagnosis not present

## 2016-11-24 DIAGNOSIS — R269 Unspecified abnormalities of gait and mobility: Secondary | ICD-10-CM | POA: Diagnosis not present

## 2016-11-24 DIAGNOSIS — M810 Age-related osteoporosis without current pathological fracture: Secondary | ICD-10-CM | POA: Diagnosis not present

## 2016-11-30 DIAGNOSIS — R609 Edema, unspecified: Secondary | ICD-10-CM | POA: Diagnosis not present

## 2016-11-30 DIAGNOSIS — Z681 Body mass index (BMI) 19 or less, adult: Secondary | ICD-10-CM | POA: Diagnosis not present

## 2016-11-30 DIAGNOSIS — E871 Hypo-osmolality and hyponatremia: Secondary | ICD-10-CM | POA: Diagnosis not present

## 2016-11-30 DIAGNOSIS — I10 Essential (primary) hypertension: Secondary | ICD-10-CM | POA: Diagnosis not present

## 2016-11-30 DIAGNOSIS — R634 Abnormal weight loss: Secondary | ICD-10-CM | POA: Diagnosis not present

## 2016-12-03 ENCOUNTER — Other Ambulatory Visit: Payer: Self-pay | Admitting: Family Medicine

## 2017-01-11 DIAGNOSIS — R609 Edema, unspecified: Secondary | ICD-10-CM | POA: Diagnosis not present

## 2017-01-11 DIAGNOSIS — I10 Essential (primary) hypertension: Secondary | ICD-10-CM | POA: Diagnosis not present

## 2017-01-11 DIAGNOSIS — Z1389 Encounter for screening for other disorder: Secondary | ICD-10-CM | POA: Diagnosis not present

## 2017-01-11 DIAGNOSIS — E7800 Pure hypercholesterolemia, unspecified: Secondary | ICD-10-CM | POA: Diagnosis not present

## 2017-01-11 DIAGNOSIS — E039 Hypothyroidism, unspecified: Secondary | ICD-10-CM | POA: Diagnosis not present

## 2017-02-09 ENCOUNTER — Inpatient Hospital Stay (HOSPITAL_COMMUNITY)
Admission: EM | Admit: 2017-02-09 | Discharge: 2017-02-11 | DRG: 070 | Disposition: A | Discharge status: Home / Self Care | Payer: PPO | Attending: Internal Medicine | Admitting: Internal Medicine

## 2017-02-09 ENCOUNTER — Observation Stay (HOSPITAL_COMMUNITY): Payer: PPO

## 2017-02-09 ENCOUNTER — Emergency Department (HOSPITAL_COMMUNITY): Payer: PPO

## 2017-02-09 ENCOUNTER — Encounter (HOSPITAL_COMMUNITY): Payer: Self-pay | Admitting: Emergency Medicine

## 2017-02-09 DIAGNOSIS — E785 Hyperlipidemia, unspecified: Secondary | ICD-10-CM | POA: Diagnosis not present

## 2017-02-09 DIAGNOSIS — Z79899 Other long term (current) drug therapy: Secondary | ICD-10-CM | POA: Diagnosis not present

## 2017-02-09 DIAGNOSIS — Z515 Encounter for palliative care: Secondary | ICD-10-CM | POA: Diagnosis present

## 2017-02-09 DIAGNOSIS — K219 Gastro-esophageal reflux disease without esophagitis: Secondary | ICD-10-CM | POA: Diagnosis present

## 2017-02-09 DIAGNOSIS — G9341 Metabolic encephalopathy: Secondary | ICD-10-CM | POA: Diagnosis not present

## 2017-02-09 DIAGNOSIS — R531 Weakness: Secondary | ICD-10-CM | POA: Diagnosis not present

## 2017-02-09 DIAGNOSIS — I5033 Acute on chronic diastolic (congestive) heart failure: Secondary | ICD-10-CM | POA: Diagnosis not present

## 2017-02-09 DIAGNOSIS — R918 Other nonspecific abnormal finding of lung field: Secondary | ICD-10-CM | POA: Diagnosis present

## 2017-02-09 DIAGNOSIS — R1310 Dysphagia, unspecified: Secondary | ICD-10-CM | POA: Diagnosis present

## 2017-02-09 DIAGNOSIS — I69991 Dysphagia following unspecified cerebrovascular disease: Secondary | ICD-10-CM | POA: Diagnosis not present

## 2017-02-09 DIAGNOSIS — J850 Gangrene and necrosis of lung: Secondary | ICD-10-CM | POA: Diagnosis not present

## 2017-02-09 DIAGNOSIS — Z66 Do not resuscitate: Secondary | ICD-10-CM | POA: Diagnosis not present

## 2017-02-09 DIAGNOSIS — Z96641 Presence of right artificial hip joint: Secondary | ICD-10-CM | POA: Diagnosis present

## 2017-02-09 DIAGNOSIS — G9340 Encephalopathy, unspecified: Secondary | ICD-10-CM

## 2017-02-09 DIAGNOSIS — J90 Pleural effusion, not elsewhere classified: Secondary | ICD-10-CM

## 2017-02-09 DIAGNOSIS — I110 Hypertensive heart disease with heart failure: Secondary | ICD-10-CM | POA: Diagnosis not present

## 2017-02-09 DIAGNOSIS — E871 Hypo-osmolality and hyponatremia: Secondary | ICD-10-CM | POA: Diagnosis present

## 2017-02-09 DIAGNOSIS — Z8673 Personal history of transient ischemic attack (TIA), and cerebral infarction without residual deficits: Secondary | ICD-10-CM

## 2017-02-09 DIAGNOSIS — M1990 Unspecified osteoarthritis, unspecified site: Secondary | ICD-10-CM | POA: Diagnosis not present

## 2017-02-09 DIAGNOSIS — Z8709 Personal history of other diseases of the respiratory system: Secondary | ICD-10-CM | POA: Diagnosis not present

## 2017-02-09 DIAGNOSIS — N390 Urinary tract infection, site not specified: Secondary | ICD-10-CM | POA: Diagnosis present

## 2017-02-09 DIAGNOSIS — R410 Disorientation, unspecified: Secondary | ICD-10-CM | POA: Diagnosis not present

## 2017-02-09 DIAGNOSIS — I10 Essential (primary) hypertension: Secondary | ICD-10-CM | POA: Diagnosis present

## 2017-02-09 LAB — COMPREHENSIVE METABOLIC PANEL
ALT: 9 U/L — ABNORMAL LOW (ref 14–54)
AST: 21 U/L (ref 15–41)
Albumin: 3.4 g/dL — ABNORMAL LOW (ref 3.5–5.0)
Alkaline Phosphatase: 47 U/L (ref 38–126)
Anion gap: 9 (ref 5–15)
BILIRUBIN TOTAL: 0.8 mg/dL (ref 0.3–1.2)
BUN: 7 mg/dL (ref 6–20)
CALCIUM: 9.3 mg/dL (ref 8.9–10.3)
CHLORIDE: 93 mmol/L — AB (ref 101–111)
CO2: 27 mmol/L (ref 22–32)
CREATININE: 0.69 mg/dL (ref 0.44–1.00)
Glucose, Bld: 113 mg/dL — ABNORMAL HIGH (ref 65–99)
Potassium: 3.7 mmol/L (ref 3.5–5.1)
Sodium: 129 mmol/L — ABNORMAL LOW (ref 135–145)
TOTAL PROTEIN: 6.2 g/dL — AB (ref 6.5–8.1)

## 2017-02-09 LAB — URINALYSIS, ROUTINE W REFLEX MICROSCOPIC
BILIRUBIN URINE: NEGATIVE
Bacteria, UA: NONE SEEN
GLUCOSE, UA: NEGATIVE mg/dL
Hgb urine dipstick: NEGATIVE
KETONES UR: NEGATIVE mg/dL
Nitrite: NEGATIVE
PH: 6 (ref 5.0–8.0)
Protein, ur: NEGATIVE mg/dL
SPECIFIC GRAVITY, URINE: 1.013 (ref 1.005–1.030)

## 2017-02-09 LAB — DIFFERENTIAL
BASOS ABS: 0 10*3/uL (ref 0.0–0.1)
Basophils Relative: 0 %
EOS ABS: 0 10*3/uL (ref 0.0–0.7)
Eosinophils Relative: 0 %
Lymphocytes Relative: 20 %
Lymphs Abs: 1.8 10*3/uL (ref 0.7–4.0)
Monocytes Absolute: 1.1 10*3/uL — ABNORMAL HIGH (ref 0.1–1.0)
Monocytes Relative: 12 %
NEUTROS ABS: 6.1 10*3/uL (ref 1.7–7.7)
Neutrophils Relative %: 68 %

## 2017-02-09 LAB — CBC
HCT: 33.1 % — ABNORMAL LOW (ref 36.0–46.0)
Hemoglobin: 11.1 g/dL — ABNORMAL LOW (ref 12.0–15.0)
MCH: 24.8 pg — ABNORMAL LOW (ref 26.0–34.0)
MCHC: 33.5 g/dL (ref 30.0–36.0)
MCV: 73.9 fL — AB (ref 78.0–100.0)
Platelets: 437 10*3/uL — ABNORMAL HIGH (ref 150–400)
RBC: 4.48 MIL/uL (ref 3.87–5.11)
RDW: 17.5 % — ABNORMAL HIGH (ref 11.5–15.5)
WBC: 9.1 10*3/uL (ref 4.0–10.5)

## 2017-02-09 LAB — CBG MONITORING, ED: GLUCOSE-CAPILLARY: 121 mg/dL — AB (ref 65–99)

## 2017-02-09 MED ORDER — IOPAMIDOL (ISOVUE-300) INJECTION 61%
INTRAVENOUS | Status: AC
  Administered 2017-02-09
  Filled 2017-02-09: qty 75

## 2017-02-09 MED ORDER — SODIUM CHLORIDE 0.9 % IV BOLUS (SEPSIS)
500.0000 mL | Freq: Once | INTRAVENOUS | Status: AC
Start: 2017-02-09 — End: 2017-02-09
  Administered 2017-02-09: 500 mL via INTRAVENOUS

## 2017-02-09 MED ORDER — DOCUSATE SODIUM 100 MG PO CAPS: 100.0000 mg | ORAL_CAPSULE | Freq: Every day | ORAL | Status: DC | PRN

## 2017-02-09 MED ORDER — POLYSACCHARIDE IRON COMPLEX 150 MG PO CAPS
150.0000 mg | ORAL_CAPSULE | Freq: Every day | ORAL | Status: DC
  Administered 2017-02-10 – 2017-02-11 (×2): 150 mg via ORAL
  Filled 2017-02-09 (×2): qty 1

## 2017-02-09 MED ORDER — SODIUM CHLORIDE 0.9 % IV SOLN
INTRAVENOUS | Status: DC
  Administered 2017-02-10: 02:00:00 via INTRAVENOUS

## 2017-02-09 MED ORDER — ONDANSETRON HCL 4 MG/2ML IJ SOLN: 4.0000 mg | Freq: Four times a day (QID) | INTRAMUSCULAR | Status: DC | PRN

## 2017-02-09 MED ORDER — CALCIUM CARBONATE-VITAMIN D 500-200 MG-UNIT PO TABS
1.0000 | ORAL_TABLET | Freq: Every day | ORAL | Status: DC
  Administered 2017-02-10 – 2017-02-11 (×2): 1 via ORAL
  Filled 2017-02-09 (×2): qty 1

## 2017-02-09 MED ORDER — ENOXAPARIN SODIUM 40 MG/0.4ML ~~LOC~~ SOLN
40.0000 mg | SUBCUTANEOUS | Status: DC
  Administered 2017-02-10 – 2017-02-11 (×2): 40 mg via SUBCUTANEOUS
  Filled 2017-02-09 (×3): qty 0.4

## 2017-02-09 MED ORDER — ONDANSETRON HCL 4 MG PO TABS: 4.0000 mg | ORAL_TABLET | Freq: Four times a day (QID) | ORAL | Status: DC | PRN

## 2017-02-09 MED ORDER — ACETAMINOPHEN 325 MG PO TABS: 650.0000 mg | ORAL_TABLET | Freq: Four times a day (QID) | ORAL | Status: DC | PRN

## 2017-02-09 MED ORDER — HYDRALAZINE HCL 50 MG PO TABS
75.0000 mg | ORAL_TABLET | Freq: Three times a day (TID) | ORAL | Status: DC
  Administered 2017-02-10 – 2017-02-11 (×5): 75 mg via ORAL
  Filled 2017-02-09 (×6): qty 1

## 2017-02-09 MED ORDER — LORATADINE 10 MG PO TABS
10.0000 mg | ORAL_TABLET | Freq: Every day | ORAL | Status: DC
  Administered 2017-02-10 – 2017-02-11 (×2): 10 mg via ORAL
  Filled 2017-02-09 (×2): qty 1

## 2017-02-09 MED ORDER — IOPAMIDOL (ISOVUE-300) INJECTION 61%
INTRAVENOUS | Status: AC
  Administered 2017-02-09: 75 mL
  Filled 2017-02-09: qty 75

## 2017-02-09 MED ORDER — SODIUM CHLORIDE 0.9% FLUSH
3.0000 mL | Freq: Two times a day (BID) | INTRAVENOUS | Status: DC
  Administered 2017-02-10 (×2): 3 mL via INTRAVENOUS
  Administered 2017-02-11: 10:00:00 via INTRAVENOUS

## 2017-02-09 MED ORDER — PRAVASTATIN SODIUM 40 MG PO TABS
40.0000 mg | ORAL_TABLET | Freq: Every day | ORAL | Status: DC
  Administered 2017-02-10 – 2017-02-11 (×3): 40 mg via ORAL
  Filled 2017-02-09 (×3): qty 1

## 2017-02-09 NOTE — ED Notes (Signed)
Patient transported to X-ray 

## 2017-02-09 NOTE — ED Triage Notes (Signed)
Pt having ams for two days now. Pt was treated for low sodium during the holidays and was altered at that time. Family concerned she may have low sodium again. Family reports pt is having hallucinations and "talking out of her head."

## 2017-02-09 NOTE — H&P (Addendum)
History and Physical    [name] Fry ZOX:[id number] DOB: 08-08-1920 DOA: 02/09/2017  Referring MD/NP/PA:   PCP: Wilmer Floor., MD   Patient coming from:  The patient is coming from home.  At baseline, pt is partially dependent for most of ADL.  Chief Complaint: AMS  HPI: [name] Fry is a 83 y.o. female with medical history significant of hypertension, hyperlipidemia, GERD, stroke, arthritis, dCHF, upper GI bleeding, hyponatremia, who presents with altered mental status.  Per patient's daughter and her granddaughter, patient has become confused in the past 2 days. She is intermittently disorientated, has hallucination. Pt  was telling family to walk around a man on the floor, but there was no one there. Patient has generalized weakness, but no focal weakness or numbness. No fall. Pt has mild cough recently. No fever or chills, n/v/d, abd pain, chest pain, dysuria, frequency of urine, dyspnea. Of note, pt had hx of hyponatremia which caused her confusion in the past.  ED Course: pt was found to have sodium 129, WBC 9.1, urinalysis with trace amount of leukocytes, creatinine normal, temperature normal, bradycardia, oxygen sats are 96% on room air. Chest x-ray showed moderate bilateral pleural effusion. Chest x-rays negative for acute intracranial abnormalities. Patient is placed on telemetry bed for observation.  Review of Systems: Could not be reviewed accurately due to altered mental status  Allergy: No Known Allergies  Past Medical History:  Diagnosis Date  . Arthritis    "arms" (10/13/2016)  . High cholesterol   . Hypertension   . Stroke [facility] ~ 2015   "swallow difficulties since" (10/13/2016)    Past Surgical History:  Procedure Laterality Date  . ABDOMINAL HERNIA REPAIR    . CATARACT EXTRACTION Right   . HERNIA REPAIR    . HIP ARTHROPLASTY Right 07/10/2014   Procedure: ARTHROPLASTY BIPOLAR HIP;  Surgeon: Cheral Almas, MD;  Location: [facility] OR;  Service:  Orthopedics;  Laterality: Right;  . JOINT REPLACEMENT      Social History:  reports that she has never smoked. She has never used smokeless tobacco. She reports that she does not drink alcohol or use drugs.  Family History:  Family History  Problem Relation Age of Onset  . Dementia Other   . CAD Neg Hx      Prior to Admission medications   Medication Sig Start Date End Date Taking? Authorizing Provider  calcium-vitamin D (OSCAL WITH D) 500-200 MG-UNIT per tablet Take 1 tablet by mouth daily with breakfast. Patient taking differently: Take 1 tablet by mouth daily.  07/10/14  Yes Naiping Donnelly Stager, MD  Docusate Calcium (STOOL SOFTENER PO) Take 1 capsule by mouth as needed (for constipation).   Yes Historical Provider, MD  hydrALAZINE (APRESOLINE) 50 MG tablet Take 1.5 tablets (75 mg total) by mouth 3 (three) times daily. 12/26/14  Yes Ames Dura, MD  loratadine (CLARITIN) 10 MG tablet Take 10 mg by mouth daily.   Yes Historical Provider, MD  Polysaccharide Iron Complex (FERREX 150 PO) Take 150 mg by mouth daily.   Yes Historical Provider, MD  pravastatin (PRAVACHOL) 40 MG tablet Take 40 mg by mouth daily.   Yes Historical Provider, MD  spironolactone (ALDACTONE) 50 MG tablet Take 50 mg by mouth daily.   Yes Historical Provider, MD  acetaminophen (TYLENOL) 325 MG tablet Take 650 mg by mouth every 6 (six) hours as needed for mild pain or moderate pain.    Historical Provider, MD  feeding supplement, ENSURE COMPLETE, (ENSURE COMPLETE) LIQD  Take 237 mLs by mouth 2 (two) times daily between meals. Patient not taking: Reported on 10/08/2016 07/12/14   Richarda Overlie, MD  ondansetron (ZOFRAN) 4 MG tablet Take 1 tablet (4 mg total) by mouth every 6 (six) hours. Patient not taking: Reported on 10/08/2016 12/26/14   Ames Dura, MD  pantoprazole (PROTONIX) 40 MG tablet Take 1 tablet (40 mg total) by mouth 2 (two) times daily. Patient not taking: Reported on 02/09/2017 10/13/16   Wendee Beavers, DO    pantoprazole (PROTONIX) 40 MG tablet Take 1 tablet (40 mg total) by mouth 2 (two) times daily. Patient not taking: Reported on 02/09/2017 10/18/16   Tillman Sers, DO  polyethylene glycol (MIRALAX / GLYCOLAX) packet Take 17 g by mouth daily as needed for mild constipation. Patient not taking: Reported on 10/08/2016 07/12/14   Richarda Overlie, MD  Pramoxine-Camphor-Zinc Acetate (ANTI ITCH EX) Apply 1 application topically as needed (for itching).    Historical Provider, MD  senna-docusate (SENOKOT S) 8.6-50 MG per tablet Take 1 tablet by mouth at bedtime as needed. Patient not taking: Reported on 10/08/2016 07/10/14   Tarry Kos, MD    Physical Exam: Vitals:   02/09/17 2100 02/09/17 2115 02/09/17 2130 02/09/17 2301  BP:   (!) 151/78 (!) 151/78  Pulse:   90 88  Resp:   14 18  Temp:    97.8 F (36.6 C)  TempSrc:      SpO2: 100% 98% 98% 100%   General: Not in acute distress HEENT:       Eyes: PERRL, EOMI, no scleral icterus.       ENT: No discharge from the ears and nose, no pharynx injection, no tonsillar enlargement.        Neck: No JVD, no bruit, no mass felt. Heme: No neck lymph node enlargement. Cardiac: S1/S2, RRR, No murmurs, No gallops or rubs. Respiratory: No rales, wheezing, rhonchi or rubs. GI: Soft, nondistended, nontender, no rebound pain, no organomegaly, BS present. GU: No hematuria Ext: No pitting leg edema bilaterally. 2+DP/PT pulse bilaterally. Musculoskeletal: No joint deformities, No joint redness or warmth, no limitation of ROM in spin. Skin: No rashes.  Neuro: confused, oriented to place, but not to time. Knows her daugther. Cranial nerves II-XII grossly intact, moves all extremities normally.  Psych: Patient is not psychotic, no suicidal or hemocidal ideation.  Labs on Admission: I have personally reviewed following labs and imaging studies  CBC:  Recent Labs Lab 02/09/17 1715  WBC 9.1  NEUTROABS 6.1  HGB 11.1*  HCT 33.1*  MCV 73.9*  PLT 437*    Basic Metabolic Panel:  Recent Labs Lab 02/09/17 1715 02/10/17 0020  NA 129* 130*  K 3.7 3.5  CL 93* 98*  CO2 27 26  GLUCOSE 113* 85  BUN 7 6  CREATININE 0.69 0.62  CALCIUM 9.3 8.4*   GFR: CrCl cannot be calculated (Unknown ideal weight.). Liver Function Tests:  Recent Labs Lab 02/09/17 1715  AST 21  ALT 9*  ALKPHOS 47  BILITOT 0.8  PROT 6.2*  ALBUMIN 3.4*   No results for input(s): LIPASE, AMYLASE in the last 168 hours. No results for input(s): AMMONIA in the last 168 hours. Coagulation Profile: No results for input(s): INR, PROTIME in the last 168 hours. Cardiac Enzymes: No results for input(s): CKTOTAL, CKMB, CKMBINDEX, TROPONINI in the last 168 hours. BNP (last 3 results) No results for input(s): PROBNP in the last 8760 hours. HbA1C: No results for input(s): HGBA1C in the  last 72 hours. CBG:  Recent Labs Lab 02/09/17 1708  GLUCAP 121*   Lipid Profile: No results for input(s): CHOL, HDL, LDLCALC, TRIG, CHOLHDL, LDLDIRECT in the last 72 hours. Thyroid Function Tests:  Recent Labs  02/10/17 0020  TSH 2.345   Anemia Panel: No results for input(s): VITAMINB12, FOLATE, FERRITIN, TIBC, IRON, RETICCTPCT in the last 72 hours. Urine analysis:    Component Value Date/Time   COLORURINE YELLOW 02/09/2017 1914   APPEARANCEUR CLEAR 02/09/2017 1914   LABSPEC 1.013 02/09/2017 1914   PHURINE 6.0 02/09/2017 1914   GLUCOSEU NEGATIVE 02/09/2017 1914   HGBUR NEGATIVE 02/09/2017 1914   BILIRUBINUR NEGATIVE 02/09/2017 1914   KETONESUR NEGATIVE 02/09/2017 1914   PROTEINUR NEGATIVE 02/09/2017 1914   UROBILINOGEN 0.2 12/22/2014 0903   NITRITE NEGATIVE 02/09/2017 1914   LEUKOCYTESUR TRACE (A) 02/09/2017 1914   Sepsis Labs: @LABRCNTIP (procalcitonin:4,lacticidven:4) )No results found for this or any previous visit (from the past 240 hour(s)).   Radiological Exams on Admission: Dg Chest 2 View  Result Date: 02/09/2017 CLINICAL DATA:  Confusion and  disorientation. EXAM: CHEST  2 VIEW COMPARISON:  10/11/16 FINDINGS: There is moderate cardiac enlargement. Aortic atherosclerosis. There are moderate bilateral pleural effusions identified. Mild interstitial edema. Aortic atherosclerosis noted. IMPRESSION: 1. Moderate bilateral pleural effusions are identified. Electronically Signed   By: Signa Kell M.D.   On: 02/09/2017 18:47   Ct Head Wo Contrast  Result Date: 02/09/2017 CLINICAL DATA:  Confusion for 2 days EXAM: CT HEAD WITHOUT CONTRAST TECHNIQUE: Contiguous axial images were obtained from the base of the skull through the vertex without intravenous contrast. COMPARISON:  10/08/2016 FINDINGS: Brain: No intracranial hemorrhage, mass effect or midline shift. Stable atrophy. Again noted periventricular and patchy subcortical white matter decreased attenuation consistent with chronic small vessel ischemic changes. No acute cortical infarction. No mass lesion is noted on this unenhanced scan. Vascular: Atherosclerotic calcifications bilateral vertebral arteries. Atherosclerotic calcifications of carotid siphon. Skull: No skull fracture. Sinuses/Orbits: No acute findings. Other: None IMPRESSION: No acute intracranial abnormality. Stable atrophy and chronic white matter disease. No definite acute cortical infarction. Electronically Signed   By: Natasha Mead M.D.   On: 02/09/2017 19:23   Ct Chest W Contrast  Result Date: 02/09/2017 CLINICAL DATA:  Persistent bilateral pleural effusions noted on chest radiograph. Recurrent hyponatremia. Assess for underlying malignancy. Initial encounter. EXAM: CT CHEST WITH CONTRAST TECHNIQUE: Multidetector CT imaging of the chest was performed during intravenous contrast administration. CONTRAST:  75mL ISOVUE-300 IOPAMIDOL (ISOVUE-300) INJECTION 61% COMPARISON:  Chest radiograph performed 10/11/2016, and earlier today at 6:32 p.m. FINDINGS: Cardiovascular: The heart is enlarged. Diffuse coronary artery calcifications are seen.  Scattered calcification is left along the thoracic aorta. The proximal right vessels are grossly unremarkable. 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 Mediastinum/Nodes: The mediastinum is otherwise unremarkable. No mediastinal lymphadenopathy is seen. No pericardial effusion is identified. The thyroid gland is unremarkable in appearance. No axillary lymphadenopathy is seen. Lungs/Pleura: A 3.4 cm masslike density is noted at the right lower lobe, with an adjacent area of consolidation of the right lower lobe. This demonstrates areas of decreased attenuation, raising question for necrotizing pneumonia. An underlying loculated trace right pleural effusion is noted. Trace left-sided pleural fluid is noted, with underlying irregular pleural thickening and calcification along the anterolateral aspect of the left hemithorax. No definite focal mass is seen to suggest mesothelioma, though it cannot be entirely excluded. No pneumothorax is seen.  Upper Abdomen: The visualized portions of the liver and spleen are grossly unremarkable. Moderate bilateral renal atrophy is noted. Musculoskeletal: No acute osseous abnormalities are identified. Degenerative change is noted about the right humeral head. The visualized musculature is unremarkable in appearance. IMPRESSION: 1. 3.4 cm masslike density at the right lower lung lobe, with adjacent area of consolidation of the right lower lobe. This demonstrates areas of decreased attenuation, raising question for necrotizing pneumonia. Underlying loculated trace right pleural effusion noted. Bronchoalveolar lavage could be considered, if deemed clinically appropriate, or could treat for pneumonia and perform follow-up PET/CT after resolution of symptoms to assess for underlying malignancy. 2. Irregular pleural thickening and calcification along the anterolateral aspect of  the left hemithorax, with trace left-sided pleural fluid. No definite focal mass seen to suggest mesothelioma, though it cannot be entirely excluded. This could also be further assessed on PET/CT if deemed clinically appropriate, after resolution of the patient's pneumonia. 3. Cardiomegaly.  Diffuse coronary artery calcifications seen. 4. Incidental note of a retroesophageal right subclavian artery. 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 This does not appear to propagate more distally. 5. Degenerative change about the right glenohumeral joint. Electronically Signed   By: Roanna RaiderJeffery  Chang M.D.   On: 02/09/2017 23:52     EKG: Independently reviewed.  Sinus rhythm, QTC 477, frequent PVC, poor R-wave progression, old right bundle blockage   Assessment/Plan Principal Problem:   Acute encephalopathy Active Problems:   HTN (hypertension)   HLD (hyperlipidemia)   History of CVA (cerebrovascular accident) without residual deficits   History of pleural effusion   Hyponatremia   Acute encephalopathy: Likely due to hyponatremia. Urinalysis has trace amount of leukocyte, but does not have symptoms of UTI per family. No other signs of infection. No leukocytosis or fever.   -will place on tele bed for obs -treat hyponatremia as below -Frequent neuro check  Hyponatremia: Sodium 129. Etiology is not clear. Per patient's daughter, patient has stroke with the mild difficulty swallowing, but has been eating well. This is recurrent issue. Patient has bilateral pleural effusion, will need to rule out lung malignancy. - Will check urine sodium, urine osmolality, serum osmolality. - check TSH - hold spironolactone. - IVF:  500 cc of NS in ED, will continue with IV normal saline at 75 mL/h - check BMP q6h - will get CT-chest  Chronic diastolic congestive heart failure: 2-D echo on 10/23/15 showed EF of 55-60% with grade 2 diastolic dysfunction. Patient has 1+ leg  edema, but no acute respiratory distress. CHF seems to be compensated. -Hold her spironolactone due to hyponatremia -Check BNP  HTN: -Continue oral hydralazine -hold spironolactone as above -IV hydralazine when necessary  HLD:  -Continue home medications: Pravastatin  History of CVA (cerebrovascular accident) without residual deficits: -continue pravastatin   DVT ppx: SQ Lovenox Code Status: DNR Family Communication: Yes, patient's  daughter and granddaughter at bed side Disposition Plan:  Anticipate discharge back to previous home environment Consults called:  none Admission status: Obs / tele    Date of Service 02/10/2017    Lorretta HarpIU, Jerardo Costabile [facility] Pager [phone]  If 7PM-7AM, please contact night-coverage [email or web address] Password [email or web address] 02/10/2017, 4:03 AM

## 2017-02-09 NOTE — ED Provider Notes (Signed)
[facility]-EMERGENCY DEPT Provider Note   CSN: [id number] Arrival date & time: 02/09/17  1625     History   Chief Complaint Chief Complaint  Patient presents with  . Altered Mental Status    HPI [name] Fry is a 83 y.o. female.  HPI 83 year old female who presents with disorientation and confusion. History of HTN, HLD, prior CVA and history of hyponatremia. Lives with granddaugther and daugther. States that she has had episodes of disorientation over past 1-2 days, similar to prior episode of hyponatremia. States this morning, when they came in to check on her, she was telling them to walk around a man on the floor, but there was no one there. Seemed more disoriented yesterday. Currently back to baseline. No history of dementia. Mild cough recently. No fever or chills, n/v/d, abd pain, chest pain, dysuria, frequency of urine, dyspnea. Has been weak since last hospitalization, but no focal weakness or numbness. No fall.   Past Medical History:  Diagnosis Date  . Arthritis    "arms" (10/13/2016)  . High cholesterol   . Hypertension   . Stroke [facility]) ~ 2015   "swallow difficulties since" (10/13/2016)    Patient Active Problem List   Diagnosis Date Noted  . Acute encephalopathy 02/09/2017  . Pleural effusion   . Goals of care, counseling/discussion   . Palliative care encounter   . Abdominal pain   . Upper GI bleed   . Generalized weakness   . Hyponatremia 10/08/2016  . Acute hyponatremia 10/08/2016  . Chronic anemia 12/23/2014  . Leukocytosis 12/23/2014  . History of pleural effusion 12/23/2014  . Aphasia 12/22/2014  . TIA (transient ischemic attack) 12/22/2014  . History of CVA (cerebrovascular accident) without residual deficits 12/22/2014  . Vitamin D deficiency 12/22/2014  . Allergic rhinitis 12/22/2014  . Osteoporosis 12/22/2014  . Protein-calorie malnutrition, severe (HCC) 07/11/2014  . Femoral neck fracture (HCC) 07/10/2014  . HTN (hypertension) 07/10/2014  . HLD  (hyperlipidemia) 07/10/2014  . Closed right hip fracture (HCC) 07/10/2014    Past Surgical History:  Procedure Laterality Date  . ABDOMINAL HERNIA REPAIR    . CATARACT EXTRACTION Right   . HERNIA REPAIR    . HIP ARTHROPLASTY Right 07/10/2014   Procedure: ARTHROPLASTY BIPOLAR HIP;  Surgeon: Cheral Almas, MD;  Location: [facility];  Service: Orthopedics;  Laterality: Right;  . JOINT REPLACEMENT      OB History    No data available       Home Medications    Prior to Admission medications   Medication Sig Start Date End Date Taking? Authorizing Provider  calcium-vitamin D (OSCAL WITH D) 500-200 MG-UNIT per tablet Take 1 tablet by mouth daily with breakfast. Patient taking differently: Take 1 tablet by mouth daily.  07/10/14  Yes Naiping Donnelly Stager, MD  Docusate Calcium (STOOL SOFTENER PO) Take 1 capsule by mouth as needed (for constipation).   Yes Historical Provider, MD  hydrALAZINE (APRESOLINE) 50 MG tablet Take 1.5 tablets (75 mg total) by mouth 3 (three) times daily. 12/26/14  Yes Ames Dura, MD  loratadine (CLARITIN) 10 MG tablet Take 10 mg by mouth daily.   Yes Historical Provider, MD  Polysaccharide Iron Complex (FERREX 150 PO) Take 150 mg by mouth daily.   Yes Historical Provider, MD  pravastatin (PRAVACHOL) 40 MG tablet Take 40 mg by mouth daily.   Yes Historical Provider, MD  spironolactone (ALDACTONE) 50 MG tablet Take 50 mg by mouth daily.   Yes Historical Provider, MD  acetaminophen (TYLENOL) 325 MG tablet Take 650 mg by mouth every 6 (six) hours as needed for mild pain or moderate pain.    Historical Provider, MD  feeding supplement, ENSURE COMPLETE, (ENSURE COMPLETE) LIQD Take 237 mLs by mouth 2 (two) times daily between meals. Patient not taking: Reported on 10/08/2016 07/12/14   Richarda OverlieNayana Abrol, MD  ondansetron (ZOFRAN) 4 MG tablet Take 1 tablet (4 mg total) by mouth every 6 (six) hours. Patient not taking: Reported on 10/08/2016 12/26/14   Ames DuraStephen Balleh, MD  pantoprazole  (PROTONIX) 40 MG tablet Take 1 tablet (40 mg total) by mouth 2 (two) times daily. Patient not taking: Reported on 02/09/2017 10/13/16   Wendee Beaversavid J McMullen, DO  pantoprazole (PROTONIX) 40 MG tablet Take 1 tablet (40 mg total) by mouth 2 (two) times daily. Patient not taking: Reported on 02/09/2017 10/18/16   Tillman SersAngela C Riccio, DO  polyethylene glycol (MIRALAX / GLYCOLAX) packet Take 17 g by mouth daily as needed for mild constipation. Patient not taking: Reported on 10/08/2016 07/12/14   Richarda OverlieNayana Abrol, MD  Pramoxine-Camphor-Zinc Acetate (ANTI ITCH EX) Apply 1 application topically as needed (for itching).    Historical Provider, MD  senna-docusate (SENOKOT S) 8.6-50 MG per tablet Take 1 tablet by mouth at bedtime as needed. Patient not taking: Reported on 10/08/2016 07/10/14   Tarry KosNaiping M Xu, MD    Family History Family History  Problem Relation Age of Onset  . Dementia Other   . CAD Neg Hx     Social History Social History  Substance Use Topics  . Smoking status: Never Smoker  . Smokeless tobacco: Never Used  . Alcohol use No     Allergies   Patient has no known allergies.   Review of Systems Review of Systems 10/14 systems reviewed and are negative other than those stated in the HPI'  Physical Exam Updated Vital Signs BP (!) 158/88   Pulse (!) 34   Temp 98.6 F (37 C) (Oral)   Resp 16   SpO2 97%   Physical Exam Physical Exam  Nursing note and vitals reviewed. Constitutional: elderly woman, frail appearing, non-toxic, and in no acute distress Head: Normocephalic and atraumatic.  Mouth/Throat: Oropharynx is clear and moist.  Neck: Normal range of motion. Neck supple.  Cardiovascular: Normal rate and regular rhythm.   Pulmonary/Chest: Effort normal and breath sounds normal.  Abdominal: Soft. There is no tenderness. There is no rebound and no guarding.  Musculoskeletal: Normal range of motion.  Neurological: Alert, no facial droop, fluent speech, moves all extremities  symmetrically, currently AO x 3, sensation to light touch in tact throughout Skin: Skin is warm and dry.  Psychiatric: Cooperative    ED Treatments / Results  Labs (all labs ordered are listed, but only abnormal results are displayed) Labs Reviewed  COMPREHENSIVE METABOLIC PANEL - Abnormal; Notable for the following:       Result Value   Sodium 129 (*)    Chloride 93 (*)    Glucose, Bld 113 (*)    Total Protein 6.2 (*)    Albumin 3.4 (*)    ALT 9 (*)    All other components within normal limits  CBC - Abnormal; Notable for the following:    Hemoglobin 11.1 (*)    HCT 33.1 (*)    MCV 73.9 (*)    MCH 24.8 (*)    RDW 17.5 (*)    Platelets 437 (*)    All other components within normal limits  DIFFERENTIAL -  Abnormal; Notable for the following:    Monocytes Absolute 1.1 (*)    All other components within normal limits  URINALYSIS, ROUTINE W REFLEX MICROSCOPIC - Abnormal; Notable for the following:    Leukocytes, UA TRACE (*)    Squamous Epithelial / LPF 0-5 (*)    All other components within normal limits  CBG MONITORING, ED - Abnormal; Notable for the following:    Glucose-Capillary 121 (*)    All other components within normal limits  URINE CULTURE    EKG  EKG Interpretation None       Radiology Dg Chest 2 View  Result Date: 02/09/2017 CLINICAL DATA:  Confusion and disorientation. EXAM: CHEST  2 VIEW COMPARISON:  10/11/16 FINDINGS: There is moderate cardiac enlargement. Aortic atherosclerosis. There are moderate bilateral pleural effusions identified. Mild interstitial edema. Aortic atherosclerosis noted. IMPRESSION: 1. Moderate bilateral pleural effusions are identified. Electronically Signed   By: Signa Kell M.D.   On: 02/09/2017 18:47   Ct Head Wo Contrast  Result Date: 02/09/2017 CLINICAL DATA:  Confusion for 2 days EXAM: CT HEAD WITHOUT CONTRAST TECHNIQUE: Contiguous axial images were obtained from the base of the skull through the vertex without  intravenous contrast. COMPARISON:  10/08/2016 FINDINGS: Brain: No intracranial hemorrhage, mass effect or midline shift. Stable atrophy. Again noted periventricular and patchy subcortical white matter decreased attenuation consistent with chronic small vessel ischemic changes. No acute cortical infarction. No mass lesion is noted on this unenhanced scan. Vascular: Atherosclerotic calcifications bilateral vertebral arteries. Atherosclerotic calcifications of carotid siphon. Skull: No skull fracture. Sinuses/Orbits: No acute findings. Other: None IMPRESSION: No acute intracranial abnormality. Stable atrophy and chronic white matter disease. No definite acute cortical infarction. Electronically Signed   By: Natasha Mead M.D.   On: 02/09/2017 19:23    Procedures Procedures (including critical care time)  Medications Ordered in ED Medications  sodium chloride 0.9 % bolus 500 mL (500 mLs Intravenous New Bag/Given 02/09/17 2108)     Initial Impression / Assessment and Plan / ED Course  I have reviewed the triage vital signs and the nursing notes.  Pertinent labs & imaging results that were available during my care of the patient were reviewed by me and considered in my medical decision making (see chart for details).    83 year old female who presents with intermittent confusion and disorientation. My evaluation she is fully oriented, with no focal deficits. Vital signs are stable. Blood work is notable for mild hyponatremia of 129 with baseline around low 130s. Records are reviewed and she was admitted back in November with severe hyponatremia of 116. Not as severe as before, but could be contributing to her intermittent confusion. No evidence of infection in her urine or on her chest x-ray. CT head does not show acute intracranial processes. I discussed with Dr. Clyde Lundborg who will admit for observation.   Final Clinical Impressions(s) / ED Diagnoses   Final diagnoses:  Hyponatremia    New  Prescriptions New Prescriptions   No medications on file     Lavera Guise, MD 02/09/17 2111

## 2017-02-09 NOTE — ED Notes (Signed)
Sent add on labels 

## 2017-02-10 DIAGNOSIS — E785 Hyperlipidemia, unspecified: Secondary | ICD-10-CM | POA: Diagnosis not present

## 2017-02-10 DIAGNOSIS — I69991 Dysphagia following unspecified cerebrovascular disease: Secondary | ICD-10-CM | POA: Diagnosis not present

## 2017-02-10 DIAGNOSIS — N390 Urinary tract infection, site not specified: Secondary | ICD-10-CM | POA: Diagnosis not present

## 2017-02-10 DIAGNOSIS — J850 Gangrene and necrosis of lung: Secondary | ICD-10-CM | POA: Diagnosis not present

## 2017-02-10 DIAGNOSIS — R918 Other nonspecific abnormal finding of lung field: Secondary | ICD-10-CM | POA: Diagnosis not present

## 2017-02-10 DIAGNOSIS — K219 Gastro-esophageal reflux disease without esophagitis: Secondary | ICD-10-CM | POA: Diagnosis not present

## 2017-02-10 DIAGNOSIS — Z515 Encounter for palliative care: Secondary | ICD-10-CM | POA: Diagnosis not present

## 2017-02-10 DIAGNOSIS — I5033 Acute on chronic diastolic (congestive) heart failure: Secondary | ICD-10-CM | POA: Diagnosis not present

## 2017-02-10 DIAGNOSIS — Z96641 Presence of right artificial hip joint: Secondary | ICD-10-CM | POA: Diagnosis not present

## 2017-02-10 DIAGNOSIS — J90 Pleural effusion, not elsewhere classified: Secondary | ICD-10-CM | POA: Diagnosis not present

## 2017-02-10 DIAGNOSIS — R1310 Dysphagia, unspecified: Secondary | ICD-10-CM | POA: Diagnosis not present

## 2017-02-10 DIAGNOSIS — Z79899 Other long term (current) drug therapy: Secondary | ICD-10-CM | POA: Diagnosis not present

## 2017-02-10 DIAGNOSIS — I110 Hypertensive heart disease with heart failure: Secondary | ICD-10-CM | POA: Diagnosis not present

## 2017-02-10 DIAGNOSIS — G9340 Encephalopathy, unspecified: Secondary | ICD-10-CM | POA: Diagnosis not present

## 2017-02-10 DIAGNOSIS — M1990 Unspecified osteoarthritis, unspecified site: Secondary | ICD-10-CM | POA: Diagnosis not present

## 2017-02-10 DIAGNOSIS — E871 Hypo-osmolality and hyponatremia: Secondary | ICD-10-CM | POA: Diagnosis not present

## 2017-02-10 DIAGNOSIS — Z66 Do not resuscitate: Secondary | ICD-10-CM | POA: Diagnosis not present

## 2017-02-10 DIAGNOSIS — R531 Weakness: Secondary | ICD-10-CM | POA: Diagnosis not present

## 2017-02-10 DIAGNOSIS — G9341 Metabolic encephalopathy: Secondary | ICD-10-CM | POA: Diagnosis not present

## 2017-02-10 LAB — SODIUM, URINE, RANDOM: SODIUM UR: 97 mmol/L

## 2017-02-10 LAB — OSMOLALITY: OSMOLALITY: 273 mosm/kg — AB (ref 275–295)

## 2017-02-10 LAB — TSH: TSH: 2.345 u[IU]/mL (ref 0.350–4.500)

## 2017-02-10 LAB — BASIC METABOLIC PANEL
ANION GAP: 6 (ref 5–15)
Anion gap: 9 (ref 5–15)
BUN: 6 mg/dL (ref 6–20)
BUN: 5 mg/dL — ABNORMAL LOW (ref 6–20)
CALCIUM: 8.8 mg/dL — AB (ref 8.9–10.3)
CHLORIDE: 96 mmol/L — AB (ref 101–111)
CHLORIDE: 98 mmol/L — AB (ref 101–111)
CO2: 26 mmol/L (ref 22–32)
CO2: 27 mmol/L (ref 22–32)
CREATININE: 0.69 mg/dL (ref 0.44–1.00)
Calcium: 8.4 mg/dL — ABNORMAL LOW (ref 8.9–10.3)
Creatinine, Ser: 0.62 mg/dL (ref 0.44–1.00)
GFR calc Af Amer: >60 mL/min (ref >60)
GFR calc non Af Amer: >60 mL/min (ref >60)
GFR calc non Af Amer: >60 mL/min (ref >60)
Glucose, Bld: 85 mg/dL (ref 65–99)
Glucose, Bld: 82 mg/dL (ref 65–99)
POTASSIUM: 3.5 mmol/L (ref 3.5–5.1)
Potassium: 3.9 mmol/L (ref 3.5–5.1)
SODIUM: 130 mmol/L — AB (ref 135–145)
SODIUM: 132 mmol/L — AB (ref 135–145)

## 2017-02-10 LAB — BRAIN NATRIURETIC PEPTIDE: B NATRIURETIC PEPTIDE 5: 470.4 pg/mL — AB (ref 0.0–100.0)

## 2017-02-10 LAB — CBC
HEMATOCRIT: 31.4 % — AB (ref 36.0–46.0)
Hemoglobin: 10.3 g/dL — ABNORMAL LOW (ref 12.0–15.0)
MCH: 24.5 pg — ABNORMAL LOW (ref 26.0–34.0)
MCHC: 32.8 g/dL (ref 30.0–36.0)
MCV: 74.6 fL — ABNORMAL LOW (ref 78.0–100.0)
PLATELETS: 379 10*3/uL (ref 150–400)
RBC: 4.21 MIL/uL (ref 3.87–5.11)
RDW: 18.1 % — AB (ref 11.5–15.5)
WBC: 7.6 10*3/uL (ref 4.0–10.5)

## 2017-02-10 LAB — URINE CULTURE: Culture: <10000 — AB

## 2017-02-10 LAB — OSMOLALITY, URINE: OSMOLALITY UR: 490 mosm/kg (ref 300–900)

## 2017-02-10 LAB — GLUCOSE, CAPILLARY: GLUCOSE-CAPILLARY: 81 mg/dL (ref 65–99)

## 2017-02-10 MED ORDER — DEXTROSE 5 % IV SOLN
1.0000 g | INTRAVENOUS | Status: DC
  Administered 2017-02-10 – 2017-02-11 (×2): 1 g via INTRAVENOUS
  Filled 2017-02-10 (×2): qty 10

## 2017-02-10 MED ORDER — AMOXICILLIN-POT CLAVULANATE 500-125 MG PO TABS
500.0000 mg | ORAL_TABLET | Freq: Two times a day (BID) | ORAL | Status: DC
  Filled 2017-02-10: qty 1

## 2017-02-10 MED ORDER — FUROSEMIDE 10 MG/ML IJ SOLN
40.0000 mg | Freq: Once | INTRAMUSCULAR | Status: AC
  Administered 2017-02-10: 40 mg via INTRAVENOUS
  Filled 2017-02-10: qty 4

## 2017-02-10 MED ORDER — SODIUM CHLORIDE 0.9 % IV SOLN: INTRAVENOUS | Status: DC

## 2017-02-10 MED ORDER — DEXTROSE 5 % IV SOLN
1.0000 g | INTRAVENOUS | Status: DC
  Filled 2017-02-10: qty 10

## 2017-02-10 MED ORDER — METRONIDAZOLE IN NACL 5-0.79 MG/ML-% IV SOLN
500.0000 mg | Freq: Three times a day (TID) | INTRAVENOUS | Status: DC
  Administered 2017-02-10 – 2017-02-11 (×4): 500 mg via INTRAVENOUS
  Filled 2017-02-10 (×5): qty 100

## 2017-02-10 MED ORDER — HYDRALAZINE HCL 20 MG/ML IJ SOLN: 5.0000 mg | INTRAMUSCULAR | Status: DC | PRN

## 2017-02-10 NOTE — Progress Notes (Signed)
  Speech Language Pathology  Patient Details Name: [name] Fry MRN: [id number] DOB: Nov 12, 1920 Today's Date: 02/10/2017 Time:  -      New order for bedside swallow assessment received. SLP completed evaluation this morning and pt without difficulty. Paged MD without response as of yet. If this assessment is desired, please reconsult.                  Royce MacadamiaLitaker, Memorie Yokoyama Willis 02/10/2017, 3:50 PM  Breck CoonsLisa Willis Lonell FaceLitaker M.Ed [employer] Pager [phone]

## 2017-02-10 NOTE — Progress Notes (Signed)
Initial Nutrition Assessment  DOCUMENTATION CODES:   Not applicable  INTERVENTION:   -Continue Ensure Enlive po BID, each supplement provides 350 kcal and 20 grams of protein  NUTRITION DIAGNOSIS:   Inadequate oral intake related to poor appetite as evidenced by meal completion < 25%.  GOAL:   Patient will meet greater than or equal to 90% of their needs  MONITOR:   PO intake, Supplement acceptance, Labs, Weight trends, Skin, I & O's  REASON FOR ASSESSMENT:   Malnutrition Screening Tool, Consult Assessment of nutrition requirement/status  ASSESSMENT:   [name] Fry is a 83 y.o. female with medical history significant of hypertension, hyperlipidemia, GERD, stroke, arthritis, dCHF, upper GI bleeding, hyponatremia, who presents with altered mental status.  Pt admitted with AMS. Per MD notes, pt is also with lung mass; palliative care consulted.   SLP completed evaluation and recommended continue   Spoke with pt at bedside, who was very drowsy. She states "I can't stay awake, I ate too much food". Noted a pt consumed some sips of coffee and a few bites of applesauce. Meal completion poor; noted 25%.   Pt wt has been stable over the past year.   Nutrition-Focused physical exam completed. Findings are mild to moderate fat depletion, mild to moderate muscle depletion, and mild to moderate edema. Suspect muscle and fat wasting is multifactorial; unable to identify malnutrition at this time.   Labs reviewed: Na: 132.   Diet Order:  DIET SOFT Room service appropriate? Yes; Fluid consistency: Thin  Skin:  Reviewed, no issues  Last BM:  02/09/17  Height:   Ht Readings from Last 1 Encounters:  02/09/17 5' (1.524 m)    Weight:   Wt Readings from Last 1 Encounters:  02/09/17 100 lb 12 oz (45.7 kg)    Ideal Body Weight:  45.7 kg  BMI:  Body mass index is 19.68 kg/m.  Estimated Nutritional Needs:   Kcal:  1100-1300  Protein:  45-60 grams  Fluid:  > 1  L  EDUCATION NEEDS:   No education needs identified at this time  [name] Fry, RD, LDN, CDE Pager: [phone] After hours Pager: [phone]

## 2017-02-10 NOTE — Care Management Obs Status (Signed)
MEDICARE OBSERVATION STATUS NOTIFICATION   Patient Details  Name: [name] Fry MRN: [id number] Date of Birth: 09/24/1920   Medicare Observation Status Notification Given:  Yes    Epifanio LeschesCole, Roselene Gray Hudson, RN 02/10/2017, 1:40 PM

## 2017-02-10 NOTE — Progress Notes (Signed)
Patient arrived on unit. Vital signs stable. No complain of pain.skin is appropriate for ethnicity and intact.iv saline is running.she is on tele box 31 and continue pulse oxymetry. She is on npo.given instruction about call light, phone and bed alarm.side rails on x 2.continue monitor and inform as needed.

## 2017-02-10 NOTE — Evaluation (Signed)
Clinical/Bedside Swallow Evaluation Patient Details  Name: Barkley Brunslice E Galen MRN: [id number] Date of Birth: [birth year]  Today's Date: 02/10/2017 Time: SLP Start Time (ACUTE ONLY): [id number] SLP Stop Time (ACUTE ONLY): 0852 SLP Time Calculation (min) (ACUTE ONLY): 14 min  Past Medical History:  Past Medical History:  Diagnosis Date  . Arthritis    "arms" (10/13/2016)  . High cholesterol   . Hypertension   . Stroke [facility](HCC) ~ 2015   "swallow difficulties since" (10/13/2016)   Past Surgical History:  Past Surgical History:  Procedure Laterality Date  . ABDOMINAL HERNIA REPAIR    . CATARACT EXTRACTION Right   . HERNIA REPAIR    . HIP ARTHROPLASTY Right 07/10/2014   Procedure: ARTHROPLASTY BIPOLAR HIP;  Surgeon: Cheral AlmasNaiping Michael Xu, MD;  [facility];  Service: Orthopedics;  Laterality: Right;  . JOINT REPLACEMENT     HPI:  Ptis a 83 y.o.femalewith PMH significant of hypertension, hyperlipidemia, GERD, stroke, arthritis, dCHF, upper GI bleeding, hyponatremia, who presents with altered mental status. Per patient's daughter and granddaughter, pt has been intermittently disorientated, has hallucinations.  Pt has mild cough recently.Has hx of hyponatremia which caused her confusion in the past.CT of head showed no acute intracranial abnormality. Stable atrophy and chronic white matter disease. No definite acute cortical infarction. BSE 09/2016 needing encouragement to eat without s/s aspiration. CXR showed masslike density at the right lower lung lobe, with adjacent area of consolidation of the right lower lobe. This demonstrates areas of decreased attenuation, raising question for necrotizing pneumonia. Underlying loculated trace right pleural effusion noted. Irregular pleural thickening and calcification along the anterolateral aspect of the left hemithorax, with trace left-sided pleural fluid.    Assessment / Plan / Recommendation Clinical Impression  One delayed cough following solid possibly  due to premature spill with cracker/crumbs. Swallow function across textures is functional as well as pills whole in puree (observed with nursing). Pt states she sometimes has difficulty masticating meats, therefore continue soft diet, thin liquids, pills whole in puree. No follow up needed.    SLP Visit Diagnosis: Dysphagia, unspecified (R13.10)    Aspiration Risk       Diet Recommendation Thin liquid (soft)   Liquid Administration via: Cup;Straw Medication Administration: Whole meds with puree Supervision: Patient able to self feed Compensations: Slow rate;Small sips/bites Postural Changes: Seated upright at 90 degrees    Other  Recommendations Oral Care Recommendations: Oral care BID   Follow up Recommendations None      Frequency and Duration            Prognosis        Swallow Study   General HPI: Ptis a 83 y.o.femalewith PMH significant of hypertension, hyperlipidemia, GERD, stroke, arthritis, dCHF, upper GI bleeding, hyponatremia, who presents with altered mental status. Per patient's daughter and granddaughter, pt has been intermittently disorientated, has hallucinations.  Pt has mild cough recently.Has hx of hyponatremia which caused her confusion in the past.CT of head showed no acute intracranial abnormality. Stable atrophy and chronic white matter disease. No definite acute cortical infarction. BSE 09/2016 needing encouragement to eat without s/s aspiration. CXR showed masslike density at the right lower lung lobe, with adjacent area of consolidation of the right lower lobe. This demonstrates areas of decreased attenuation, raising question for necrotizing pneumonia. Underlying loculated trace right pleural effusion noted. Irregular pleural thickening and calcification along the anterolateral aspect of the left hemithorax, with trace left-sided pleural fluid.  Type of Study: Bedside Swallow Evaluation Previous Swallow Assessment: see HPI  Diet Prior to this Study: Thin  liquids (soft) Temperature Spikes Noted: No Respiratory Status: Room air History of Recent Intubation: No Behavior/Cognition: Alert;Cooperative;Pleasant mood Oral Cavity Assessment: Within Functional Limits Oral Care Completed by SLP: No Oral Cavity - Dentition: Dentures, top;Dentures, bottom Vision: Functional for self-feeding Self-Feeding Abilities: Able to feed self Patient Positioning: Upright in chair Baseline Vocal Quality: Normal Volitional Cough: Strong Volitional Swallow: Able to elicit    Oral/Motor/Sensory Function Overall Oral Motor/Sensory Function: Within functional limits   Ice Chips Ice chips: Not tested   Thin Liquid Thin Liquid: Within functional limits    Nectar Thick Nectar Thick Liquid: Not tested   Honey Thick Honey Thick Liquid: Not tested   Puree Puree: Within functional limits   Solid   GO   Solid: Impaired Pharyngeal Phase Impairments: Cough - Delayed    Functional Assessment Tool Used: skilled clinical judgement Functional Limitations: Swallowing Swallow Current Status ([id number]): At least 1 percent but less than 20 percent impaired, limited or restricted Swallow Goal Status [phone]): At least 1 percent but less than 20 percent impaired, limited or restricted Swallow Discharge Status [phone]): At least 1 percent but less than 20 percent impaired, limited or restricted   Royce Macadamia 02/10/2017,9:51 AM  Breck Coons Lonell Face.Ed [employer] [phone]

## 2017-02-10 NOTE — Progress Notes (Signed)
Notified Dr Thedore MinsSingh of 4 beat run of vtach. Pt is asymptomatic. Sitting up in chair at this time.

## 2017-02-10 NOTE — Progress Notes (Signed)
PROGRESS NOTE                                                                                                                                                                                                             Patient Demographics:    [name] Fry, is a 83 y.o. female, DOB - August 20, 1920, ZOX:[id number]  Admit date - 02/09/2017   Admitting Physician Lorretta Harp, MD  Outpatient Primary MD for the patient is CAMPBELL, Mila Homer., MD  LOS - 0  Chief Complaint  Patient presents with  . Altered Mental Status       Brief Narrative  ERIONNA STRUM is a 83 y.o. female with medical history significant of hypertension, hyperlipidemia, GERD, stroke, arthritis, dCHF, upper GI bleeding, hyponatremia, who presents with altered mental status, in the ER workup showed UTI and possible necrotizing pneumonia along with mild hyponatremia.    Subjective:    Jefm Bryant today has, No headache, No chest pain, No abdominal pain - No Nausea, No new weakness tingling or numbness, No Cough - SOB.     Assessment  & Plan :     1.Encephalopathy. Most likely due to UTI. Place on Rocephin and monitor. Mentation has improved.  2. Necrotizing pneumonia versus lung mass. Had detailed discussions with patient's daughter, due to her age and no aggressive workup, gentle medical treatment only, for now I will place her on Rocephin and Flagyl once UTI treatment has finished will be transitioned to Augmentin for a total of 7 days thereafter PCP to do age-appropriate workup with full understanding that she is not a candidate for chemotherapy, radiation or surgery. Palliative care for goals of care. She is currently DO NOT RESUSCITATE.  3. Mild hyponatremia and pleural effusions. Likely acute on chronic diastolic CHF. Last EF 55%. Check BNP, Lasix and monitor.  4. Dyslipidemia. Continue statin.  5. Hypertension. Continue hydralazine.  6. Advanced age,  generalized weakness. Supportive care. PT eval may require placement.    Diet : DIET SOFT Room service appropriate? Yes; Fluid consistency: Thin    Family Communication  :  daughter  Code Status :  DNR  Disposition Plan  :  TBD  Consults  :  Pall care  Procedures  :    CT chest - Possible necrotizing pneumonia versus lung mass, mild bilateral pleural effusions.  CT  head. Nonacute  DVT Prophylaxis  :  Lovenox   Lab Results  Component Value Date   PLT 379 02/10/2017    Inpatient Medications  Scheduled Meds: . calcium-vitamin D  1 tablet Oral Daily  . enoxaparin (LOVENOX) injection  40 mg Subcutaneous Q24H  . furosemide  40 mg Intravenous Once  . hydrALAZINE  75 mg Oral TID  . iron polysaccharides  150 mg Oral Daily  . loratadine  10 mg Oral Daily  . pravastatin  40 mg Oral Daily  . sodium chloride flush  3 mL Intravenous Q12H   Continuous Infusions: PRN Meds:.acetaminophen, docusate sodium, hydrALAZINE, ondansetron **OR** ondansetron (ZOFRAN) IV  Antibiotics  :    Anti-infectives    None         Objective:   Vitals:   02/09/17 2115 02/09/17 2130 02/09/17 2301 02/10/17 0452  BP:  (!) 151/78 (!) 151/78 (!) 125/58  Pulse:  90 88 82  Resp:  14 18 17   Temp:   97.8 F (36.6 C) 97.7 F (36.5 C)  TempSrc:    Oral  SpO2: 98% 98% 100% 99%  Weight:   45.7 kg (100 lb 12 oz)   Height:   5' (1.524 m)     Wt Readings from Last 3 Encounters:  02/09/17 45.7 kg (100 lb 12 oz)  10/11/16 46.2 kg (101 lb 12.8 oz)  12/26/14 49.9 kg (110 lb)     Intake/Output Summary (Last 24 hours) at 02/10/17 1105 Last data filed at 02/10/17 1610  Gross per 24 hour  Intake           836.25 ml  Output              120 ml  Net           716.25 ml     Physical Exam  Awake Alert, No new F.N deficits, Normal affect [place].AT,PERRAL Supple Neck,No JVD, No cervical lymphadenopathy appriciated.  Symmetrical Chest wall movement, Good air movement bilaterally, CTAB RRR,No  Gallops,Rubs or new Murmurs, No Parasternal Heave +ve B.Sounds, Abd Soft, No tenderness, No organomegaly appriciated, No rebound - guarding or rigidity. No Cyanosis, Clubbing or edema, No new Rash or bruise      Data Review:    CBC  Recent Labs Lab 02/09/17 1715 02/10/17 0549  WBC 9.1 7.6  HGB 11.1* 10.3*  HCT 33.1* 31.4*  PLT 437* 379  MCV 73.9* 74.6*  MCH 24.8* 24.5*  MCHC 33.5 32.8  RDW 17.5* 18.1*  LYMPHSABS 1.8  --   MONOABS 1.1*  --   EOSABS 0.0  --   BASOSABS 0.0  --     Chemistries   Recent Labs Lab 02/09/17 1715 02/10/17 0020 02/10/17 0549  NA 129* 130* 132*  K 3.7 3.5 3.9  CL 93* 98* 96*  CO2 27 26 27   GLUCOSE 113* 85 82  BUN 7 6 5*  CREATININE 0.69 0.62 0.69  CALCIUM 9.3 8.4* 8.8*  AST 21  --   --   ALT 9*  --   --   ALKPHOS 47  --   --   BILITOT 0.8  --   --    ------------------------------------------------------------------------------------------------------------------ No results for input(s): CHOL, HDL, LDLCALC, TRIG, CHOLHDL, LDLDIRECT in the last 72 hours.  Lab Results  Component Value Date   HGBA1C 6.0 (H) 12/23/2014   ------------------------------------------------------------------------------------------------------------------  Recent Labs  02/10/17 0020  TSH 2.345   ------------------------------------------------------------------------------------------------------------------ No results for input(s): VITAMINB12, FOLATE, FERRITIN, TIBC, IRON, RETICCTPCT in  the last 72 hours.  Coagulation profile No results for input(s): INR, PROTIME in the last 168 hours.  No results for input(s): DDIMER in the last 72 hours.  Cardiac Enzymes No results for input(s): CKMB, TROPONINI, MYOGLOBIN in the last 168 hours.  Invalid input(s): CK ------------------------------------------------------------------------------------------------------------------ No results found for: BNP  Micro Results No results found for this or any  previous visit (from the past 240 hour(s)).  Radiology Reports Dg Chest 2 View  Result Date: 02/09/2017 CLINICAL DATA:  Confusion and disorientation. EXAM: CHEST  2 VIEW COMPARISON:  10/11/16 FINDINGS: There is moderate cardiac enlargement. Aortic atherosclerosis. There are moderate bilateral pleural effusions identified. Mild interstitial edema. Aortic atherosclerosis noted. IMPRESSION: 1. Moderate bilateral pleural effusions are identified. Electronically Signed   By: Signa Kellaylor  Stroud M.D.   On: 02/09/2017 18:47   Ct Head Wo Contrast  Result Date: 02/09/2017 CLINICAL DATA:  Confusion for 2 days EXAM: CT HEAD WITHOUT CONTRAST TECHNIQUE: Contiguous axial images were obtained from the base of the skull through the vertex without intravenous contrast. COMPARISON:  10/08/2016 FINDINGS: Brain: No intracranial hemorrhage, mass effect or midline shift. Stable atrophy. Again noted periventricular and patchy subcortical white matter decreased attenuation consistent with chronic small vessel ischemic changes. No acute cortical infarction. No mass lesion is noted on this unenhanced scan. Vascular: Atherosclerotic calcifications bilateral vertebral arteries. Atherosclerotic calcifications of carotid siphon. Skull: No skull fracture. Sinuses/Orbits: No acute findings. Other: None IMPRESSION: No acute intracranial abnormality. Stable atrophy and chronic white matter disease. No definite acute cortical infarction. Electronically Signed   By: Natasha MeadLiviu  Pop M.D.   On: 02/09/2017 19:23   Ct Chest W Contrast  Result Date: 02/09/2017 CLINICAL DATA:  Persistent bilateral pleural effusions noted on chest radiograph. Recurrent hyponatremia. Assess for underlying malignancy. Initial encounter. EXAM: CT CHEST WITH CONTRAST TECHNIQUE: Multidetector CT imaging of the chest was performed during intravenous contrast administration. CONTRAST:  75mL ISOVUE-300 IOPAMIDOL (ISOVUE-300) INJECTION 61% COMPARISON:  Chest radiograph performed  10/11/2016, and earlier today at 6:32 p.m. FINDINGS: Cardiovascular: The heart is enlarged. Diffuse coronary artery calcifications are seen. Scattered calcification is left along the thoracic aorta. The proximal right vessels are grossly unremarkable. 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 Mediastinum/Nodes: The mediastinum is otherwise unremarkable. No mediastinal lymphadenopathy is seen. No pericardial effusion is identified. The thyroid gland is unremarkable in appearance. No axillary lymphadenopathy is seen. Lungs/Pleura: A 3.4 cm masslike density is noted at the right lower lobe, with an adjacent area of consolidation of the right lower lobe. This demonstrates areas of decreased attenuation, raising question for necrotizing pneumonia. An underlying loculated trace right pleural effusion is noted. Trace left-sided pleural fluid is noted, with underlying irregular pleural thickening and calcification along the anterolateral aspect of the left hemithorax. No definite focal mass is seen to suggest mesothelioma, though it cannot be entirely excluded. No pneumothorax is seen. Upper Abdomen: The visualized portions of the liver and spleen are grossly unremarkable. Moderate bilateral renal atrophy is noted. Musculoskeletal: No acute osseous abnormalities are identified. Degenerative change is noted about the right humeral head. The visualized musculature is unremarkable in appearance. IMPRESSION: 1. 3.4 cm masslike density at the right lower lung lobe, with adjacent area of consolidation of the right lower lobe. This demonstrates areas of decreased attenuation, raising question for necrotizing pneumonia. Underlying loculated trace right pleural effusion noted. Bronchoalveolar lavage could be considered, if deemed clinically appropriate, or could treat for  pneumonia and perform follow-up PET/CT after  resolution of symptoms to assess for underlying malignancy. 2. Irregular pleural thickening and calcification along the anterolateral aspect of the left hemithorax, with trace left-sided pleural fluid. No definite focal mass seen to suggest mesothelioma, though it cannot be entirely excluded. This could also be further assessed on PET/CT if deemed clinically appropriate, after resolution of the patient's pneumonia. 3. Cardiomegaly.  Diffuse coronary artery calcifications seen. 4. Incidental note of a retroesophageal right subclavian artery. 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 This does not appear to propagate more distally. 5. Degenerative change about the right glenohumeral joint. Electronically Signed   By: Roanna Raider M.D.   On: 02/09/2017 23:52    Time Spent in minutes  30   Javar Eshbach K M.D on 02/10/2017 at 11:05 AM  Between 7am to 7pm - Pager - [phone] ( page via [facility], text pages only, please mention full 10 digit call back number).  After 7pm go to [email or web address] - password [facility]  [facility] -  Office  [phone]

## 2017-02-11 LAB — BASIC METABOLIC PANEL
ANION GAP: 12 (ref 5–15)
BUN: 6 mg/dL (ref 6–20)
CHLORIDE: 94 mmol/L — AB (ref 101–111)
CO2: 25 mmol/L (ref 22–32)
Calcium: 8.2 mg/dL — ABNORMAL LOW (ref 8.9–10.3)
Creatinine, Ser: 0.72 mg/dL (ref 0.44–1.00)
GFR calc Af Amer: >60 mL/min (ref >60)
GLUCOSE: 81 mg/dL (ref 65–99)
POTASSIUM: 3.2 mmol/L — AB (ref 3.5–5.1)
SODIUM: 131 mmol/L — AB (ref 135–145)

## 2017-02-11 LAB — GLUCOSE, CAPILLARY: GLUCOSE-CAPILLARY: 76 mg/dL (ref 65–99)

## 2017-02-11 MED ORDER — CEFPODOXIME PROXETIL 200 MG PO TABS: 200.0000 mg | ORAL_TABLET | Freq: Two times a day (BID) | ORAL | 0 refills | Status: DC

## 2017-02-11 MED ORDER — POTASSIUM CHLORIDE 20 MEQ/15ML (10%) PO SOLN
40.0000 meq | Freq: Once | ORAL | Status: AC
  Administered 2017-02-11: 40 meq via ORAL
  Filled 2017-02-11: qty 30

## 2017-02-11 MED ORDER — METRONIDAZOLE 500 MG PO TABS: 500.0000 mg | ORAL_TABLET | Freq: Three times a day (TID) | ORAL | 0 refills | Status: DC

## 2017-02-11 MED ORDER — POTASSIUM CHLORIDE ER 20 MEQ PO TBCR: 20.0000 meq | EXTENDED_RELEASE_TABLET | Freq: Every day | ORAL | 0 refills | Status: DC

## 2017-02-11 MED ORDER — FUROSEMIDE 20 MG PO TABS: 20.0000 mg | ORAL_TABLET | Freq: Every day | ORAL | 0 refills | Status: DC

## 2017-02-11 MED ORDER — CARVEDILOL 3.125 MG PO TABS: 3.1250 mg | ORAL_TABLET | Freq: Two times a day (BID) | ORAL | 0 refills | Status: DC

## 2017-02-11 NOTE — Progress Notes (Signed)
Patient D/C via wheelchair to front door of hospital by volunteer services. Family verbalized understanding of AVS discharge paperwork and instructions.

## 2017-02-11 NOTE — Discharge Summary (Signed)
TOBI GROESBECK Fry:[id number] DOB: 1920-09-20 DOA: 02/09/2017  PCP: Wilmer Floor., MD  Admit date: 02/09/2017  Discharge date: 02/11/2017  Admitted From:  Home   Disposition:  Home   Recommendations for Outpatient Follow-up:   Follow up with PCP in 1-2 weeks  PCP Please obtain BMP/CBC, 2 view CXR in 1week,  (see Discharge instructions)   PCP Please follow up on the following pending results: Review chest CT report closely. Age appropriate outpatient workup if needed.   Home Health: RN, PT Equipment/Devices: None  Consultations: None Discharge Condition: Fair   CODE STATUS: DNR   Diet Recommendation: DIET SOFT as feeding assistance and aspiration precautions  Chief Complaint  Patient presents with  . Altered Mental Status     Brief history of present illness from the day of admission and additional interim summary    [name] Fry a 83 y.o.femalewith medical history significant of hypertension, hyperlipidemia, GERD, stroke, arthritis, dCHF, upper GI bleeding, hyponatremia, who presents with altered mental status, in the ER workup showed UTI and possible necrotizing pneumonia along with mild hyponatremia.                                                                 Hospital Course    1.Encephalopathy. Due to UTI. PT resolved on Rocephin but she is back to her baseline, head CT unremarkable, will finish oral antibiotic course at home and follow with PCP.  2. Necrotizing pneumonia versus lung mass. Had detailed discussions with patient's daughter, due to her age and no aggressive workup, gentle medical treatment only, for now I will place her on Rocephin and Flagyl once UTI treatment has finished will be transitioned to Augmentin for a total of 7 days thereafter PCP to do age-appropriate workup  with full understanding that she is not a candidate for chemotherapy, radiation or surgery. Consider discussions in the office for long-term goals of care, family seems to be very reasonable. She is currently DO NOT RESUSCITATE.  3. Mild hyponatremia and pleural effusions. Likely acute on chronic diastolic CHF. Last EF 55%. ENT was elevated, she received IV Lasix here currently compensated continue low-dose oral Lasix at home along with potassium supplementations, low-dose Coreg added, requested to follow with PCP.  4. Dyslipidemia. Continue statin.  5. Hypertension. Continue hydralazine have added low-dose Coreg due to #3 above.  6. Advanced age, generalized weakness. Supportive care. PT eval may require placement.   Discharge diagnosis     Principal Problem:   Acute encephalopathy Active Problems:   HTN (hypertension)   HLD (hyperlipidemia)   History of CVA (cerebrovascular accident) without residual deficits   History of pleural effusion   Hyponatremia    Discharge instructions    Discharge Instructions    Discharge instructions    Complete by:  As  directed    Follow with Primary MD CAMPBELL, STEPHEN D., MD in 7 days   Get CBC, CMP, 2 view Chest X ray checked  by Primary MD or SNF MD in 5-7 days ( we routinely change or add medications that can affect your baseline labs and fluid status, therefore we recommend that you get the mentioned basic workup next visit with your PCP, your PCP may decide not to get them or add new tests based on their clinical decision)  Activity: As tolerated with Full fall precautions use walker/cane & assistance as needed  Disposition Home    Diet:   DIET SOFT  with feeding assistance and aspiration precautions.  For Heart failure patients - Check your Weight same time everyday, if you gain over 2 pounds, or you develop in leg swelling, experience more shortness of breath or chest pain, call your Primary MD immediately. Follow Cardiac Low Salt  Diet and 1.5 lit/day fluid restriction.  On your next visit with your primary care physician please Get Medicines reviewed and adjusted.  Please request your Prim.MD to go over all Hospital Tests and Procedure/Radiological results at the follow up, please get all Hospital records sent to your Prim MD by signing hospital release before you go home.  If you experience worsening of your admission symptoms, develop shortness of breath, life threatening emergency, suicidal or homicidal thoughts you must seek medical attention immediately by calling 911 or calling your MD immediately  if symptoms less severe.  You Must read complete instructions/literature along with all the possible adverse reactions/side effects for all the Medicines you take and that have been prescribed to you. Take any new Medicines after you have completely understood and accpet all the possible adverse reactions/side effects.   Do not drive, operate heavy machinery, perform activities at heights, swimming or participation in water activities or provide baby sitting services if your were admitted for syncope or siezures until you have seen by Primary MD or a Neurologist and advised to do so again.  Do not drive when taking Pain medications.    Do not take more than prescribed Pain, Sleep and Anxiety Medications  Special Instructions: If you have smoked or chewed Tobacco  in the last 2 yrs please stop smoking, stop any regular Alcohol  and or any Recreational drug use.  Wear Seat belts while driving.   Please note  You were cared for by a hospitalist during your hospital stay. If you have any questions about your discharge medications or the care you received while you were in the hospital after you are discharged, you can call the unit and asked to speak with the hospitalist on call if the hospitalist that took care of you is not available. Once you are discharged, your primary care physician will handle any further medical  issues. Please note that NO REFILLS for any discharge medications will be authorized once you are discharged, as it is imperative that you return to your primary care physician (or establish a relationship with a primary care physician if you do not have one) for your aftercare needs so that they can reassess your need for medications and monitor your lab values.   Increase activity slowly    Complete by:  As directed       Discharge Medications   Allergies as of 02/11/2017   No Known Allergies     Medication List    TAKE these medications   acetaminophen 325 MG tablet Commonly known as:  TYLENOL  Take 650 mg by mouth every 6 (six) hours as needed for mild pain or moderate pain.   ANTI ITCH EX Apply 1 application topically as needed (for itching).   calcium-vitamin D 500-200 MG-UNIT tablet Commonly known as:  OSCAL WITH D Take 1 tablet by mouth daily with breakfast. What changed:  when to take this   carvedilol 3.125 MG tablet Commonly known as:  COREG Take 1 tablet (3.125 mg total) by mouth 2 (two) times daily with a meal.   cefpodoxime 200 MG tablet Commonly known as:  VANTIN Take 1 tablet (200 mg total) by mouth 2 (two) times daily.   feeding supplement (ENSURE COMPLETE) Liqd Take 237 mLs by mouth 2 (two) times daily between meals.   FERREX 150 PO Take 150 mg by mouth daily.   furosemide 20 MG tablet Commonly known as:  LASIX Take 1 tablet (20 mg total) by mouth daily.   hydrALAZINE 50 MG tablet Commonly known as:  APRESOLINE Take 1.5 tablets (75 mg total) by mouth 3 (three) times daily.   loratadine 10 MG tablet Commonly known as:  CLARITIN Take 10 mg by mouth daily.   metroNIDAZOLE 500 MG tablet Commonly known as:  FLAGYL Take 1 tablet (500 mg total) by mouth 3 (three) times daily.   ondansetron 4 MG tablet Commonly known as:  ZOFRAN Take 1 tablet (4 mg total) by mouth every 6 (six) hours.   pantoprazole 40 MG tablet Commonly known as:  PROTONIX Take  1 tablet (40 mg total) by mouth 2 (two) times daily.   pantoprazole 40 MG tablet Commonly known as:  PROTONIX Take 1 tablet (40 mg total) by mouth 2 (two) times daily.   polyethylene glycol packet Commonly known as:  MIRALAX / GLYCOLAX Take 17 g by mouth daily as needed for mild constipation.   Potassium Chloride ER 20 MEQ Tbcr Take 20 mEq by mouth daily.   pravastatin 40 MG tablet Commonly known as:  PRAVACHOL Take 40 mg by mouth daily.   senna-docusate 8.6-50 MG tablet Commonly known as:  SENOKOT S Take 1 tablet by mouth at bedtime as needed.   spironolactone 50 MG tablet Commonly known as:  ALDACTONE Take 50 mg by mouth daily.   STOOL SOFTENER PO Take 1 capsule by mouth as needed (for constipation).       Follow-up Information    CAMPBELL, STEPHEN D., MD. Schedule an appointment as soon as possible for a visit in 1 week(s).   Specialty:  Internal Medicine Contact information: [address] [place] [address] [phone]           Major procedures and Radiology Reports - PLEASE review detailed and final reports thoroughly  -       Dg Chest 2 View  Result Date: 02/09/2017 CLINICAL DATA:  Confusion and disorientation. EXAM: CHEST  2 VIEW COMPARISON:  10/11/16 FINDINGS: There is moderate cardiac enlargement. Aortic atherosclerosis. There are moderate bilateral pleural effusions identified. Mild interstitial edema. Aortic atherosclerosis noted. IMPRESSION: 1. Moderate bilateral pleural effusions are identified. Electronically Signed   By: Signa Kell M.D.   On: 02/09/2017 18:47   Ct Head Wo Contrast  Result Date: 02/09/2017 CLINICAL DATA:  Confusion for 2 days EXAM: CT HEAD WITHOUT CONTRAST TECHNIQUE: Contiguous axial images were obtained from the base of the skull through the vertex without intravenous contrast. COMPARISON:  10/08/2016 FINDINGS: Brain: No intracranial hemorrhage, mass effect or midline shift. Stable atrophy. Again noted  periventricular and patchy subcortical  white matter decreased attenuation consistent with chronic small vessel ischemic changes. No acute cortical infarction. No mass lesion is noted on this unenhanced scan. Vascular: Atherosclerotic calcifications bilateral vertebral arteries. Atherosclerotic calcifications of carotid siphon. Skull: No skull fracture. Sinuses/Orbits: No acute findings. Other: None IMPRESSION: No acute intracranial abnormality. Stable atrophy and chronic white matter disease. No definite acute cortical infarction. Electronically Signed   By: Natasha Mead M.D.   On: 02/09/2017 19:23   Ct Chest W Contrast  Result Date: 02/09/2017 CLINICAL DATA:  Persistent bilateral pleural effusions noted on chest radiograph. Recurrent hyponatremia. Assess for underlying malignancy. Initial encounter. EXAM: CT CHEST WITH CONTRAST TECHNIQUE: Multidetector CT imaging of the chest was performed during intravenous contrast administration. CONTRAST:  75mL ISOVUE-300 IOPAMIDOL (ISOVUE-300) INJECTION 61% COMPARISON:  Chest radiograph performed 10/11/2016, and earlier today at 6:32 p.m. FINDINGS: Cardiovascular: The heart is enlarged. Diffuse coronary artery calcifications are seen. Scattered calcification is left along the thoracic aorta. The proximal right vessels are grossly unremarkable. 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 Mediastinum/Nodes: The mediastinum is otherwise unremarkable. No mediastinal lymphadenopathy is seen. No pericardial effusion is identified. The thyroid gland is unremarkable in appearance. No axillary lymphadenopathy is seen. Lungs/Pleura: A 3.4 cm masslike density is noted at the right lower lobe, with an adjacent area of consolidation of the right lower lobe. This demonstrates areas of decreased attenuation, raising question for necrotizing pneumonia. An underlying loculated  trace right pleural effusion is noted. Trace left-sided pleural fluid is noted, with underlying irregular pleural thickening and calcification along the anterolateral aspect of the left hemithorax. No definite focal mass is seen to suggest mesothelioma, though it cannot be entirely excluded. No pneumothorax is seen. Upper Abdomen: The visualized portions of the liver and spleen are grossly unremarkable. Moderate bilateral renal atrophy is noted. Musculoskeletal: No acute osseous abnormalities are identified. Degenerative change is noted about the right humeral head. The visualized musculature is unremarkable in appearance. IMPRESSION: 1. 3.4 cm masslike density at the right lower lung lobe, with adjacent area of consolidation of the right lower lobe. This demonstrates areas of decreased attenuation, raising question for necrotizing pneumonia. Underlying loculated trace right pleural effusion noted. Bronchoalveolar lavage could be considered, if deemed clinically appropriate, or could treat for pneumonia and perform follow-up PET/CT after resolution of symptoms to assess for underlying malignancy. 2. Irregular pleural thickening and calcification along the anterolateral aspect of the left hemithorax, with trace left-sided pleural fluid. No definite focal mass seen to suggest mesothelioma, though it cannot be entirely excluded. This could also be further assessed on PET/CT if deemed clinically appropriate, after resolution of the patient's pneumonia. 3. Cardiomegaly.  Diffuse coronary artery calcifications seen. 4. Incidental note of a retroesophageal right subclavian artery. 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 This does not appear to propagate more distally. 5. Degenerative change about the right glenohumeral joint. Electronically Signed   By: Roanna Raider M.D.   On: 02/09/2017 23:52    Micro Results     Recent Results (from the past 240 hour(s))  Urine culture      Status: Abnormal   Collection Time: 02/09/17  7:14 PM  Result Value Ref Range Status   Specimen Description URINE, RANDOM  Final   Special Requests NONE  Final   Culture <10,000 COLONIES/mL INSIGNIFICANT GROWTH (A)  Final   Report Status 02/10/2017 FINAL  Final    Today  Subjective    Charlisha Market today has no headache,no chest abdominal pain,no new weakness tingling or numbness, feels much better wants to go home today.     Objective   Blood pressure (!) 131/49, pulse 84, temperature 98.2 F (36.8 C), temperature source Oral, resp. rate 16, height 5' (1.524 m), weight 45.7 kg (100 lb 12 oz), SpO2 100 %.   Intake/Output Summary (Last 24 hours) at 02/11/17 1219 Last data filed at 02/11/17 1047  Gross per 24 hour  Intake              680 ml  Output              900 ml  Net             -220 ml    Exam Awake Alert,  , No new F.N deficits, Normal affect [place].AT,PERRAL Supple Neck,No JVD, No cervical lymphadenopathy appriciated.  Symmetrical Chest wall movement, Good air movement bilaterally, CTAB RRR,No Gallops,Rubs or new Murmurs, No Parasternal Heave +ve B.Sounds, Abd Soft, Non tender, No organomegaly appriciated, No rebound -guarding or rigidity. No Cyanosis, Clubbing or edema, No new Rash or bruise   Data Review   CBC w Diff:  Lab Results  Component Value Date   WBC 7.6 02/10/2017   HGB 10.3 (L) 02/10/2017   HCT 31.4 (L) 02/10/2017   PLT 379 02/10/2017   LYMPHOPCT 20 02/09/2017   MONOPCT 12 02/09/2017   EOSPCT 0 02/09/2017   BASOPCT 0 02/09/2017    CMP:  Lab Results  Component Value Date   NA 131 (L) 02/11/2017   K 3.2 (L) 02/11/2017   CL 94 (L) 02/11/2017   CO2 25 02/11/2017   BUN 6 02/11/2017   CREATININE 0.72 02/11/2017   PROT 6.2 (L) 02/09/2017   ALBUMIN 3.4 (L) 02/09/2017   BILITOT 0.8 02/09/2017   ALKPHOS 47 02/09/2017   AST 21 02/09/2017   ALT 9 (L) 02/09/2017  .   Total Time in preparing paper work, data evaluation and todays exam - 35  minutes  Leroy Sea M.D on 02/11/2017 at 12:19 PM  [facility]   Office  [phone]

## 2017-02-11 NOTE — Discharge Instructions (Signed)
Follow with Primary MD CAMPBELL, STEPHEN D., MD in 7 days   Get CBC, CMP, 2 view Chest X ray checked  by Primary MD or SNF MD in 5-7 days ( we routinely change or add medications that can affect your baseline labs and fluid status, therefore we recommend that you get the mentioned basic workup next visit with your PCP, your PCP may decide not to get them or add new tests based on their clinical decision)  Activity: As tolerated with Full fall precautions use walker/cane & assistance as needed  Disposition Home    Diet:   DIET SOFT  with feeding assistance and aspiration precautions.  For Heart failure patients - Check your Weight same time everyday, if you gain over 2 pounds, or you develop in leg swelling, experience more shortness of breath or chest pain, call your Primary MD immediately. Follow Cardiac Low Salt Diet and 1.5 lit/day fluid restriction.  On your next visit with your primary care physician please Get Medicines reviewed and adjusted.  Please request your Prim.MD to go over all Hospital Tests and Procedure/Radiological results at the follow up, please get all Hospital records sent to your Prim MD by signing hospital release before you go home.  If you experience worsening of your admission symptoms, develop shortness of breath, life threatening emergency, suicidal or homicidal thoughts you must seek medical attention immediately by calling 911 or calling your MD immediately  if symptoms less severe.  You Must read complete instructions/literature along with all the possible adverse reactions/side effects for all the Medicines you take and that have been prescribed to you. Take any new Medicines after you have completely understood and accpet all the possible adverse reactions/side effects.   Do not drive, operate heavy machinery, perform activities at heights, swimming or participation in water activities or provide baby sitting services if your were admitted for syncope or siezures  until you have seen by Primary MD or a Neurologist and advised to do so again.  Do not drive when taking Pain medications.    Do not take more than prescribed Pain, Sleep and Anxiety Medications  Special Instructions: If you have smoked or chewed Tobacco  in the last 2 yrs please stop smoking, stop any regular Alcohol  and or any Recreational drug use.  Wear Seat belts while driving.   Please note  You were cared for by a hospitalist during your hospital stay. If you have any questions about your discharge medications or the care you received while you were in the hospital after you are discharged, you can call the unit and asked to speak with the hospitalist on call if the hospitalist that took care of you is not available. Once you are discharged, your primary care physician will handle any further medical issues. Please note that NO REFILLS for any discharge medications will be authorized once you are discharged, as it is imperative that you return to your primary care physician (or establish a relationship with a primary care physician if you do not have one) for your aftercare needs so that they can reassess your need for medications and monitor your lab values.

## 2017-02-11 NOTE — Progress Notes (Signed)
Patient to d/c home as per MD order. Will go over AVS with family once arrived to pick up for discharge

## 2017-02-17 ENCOUNTER — Emergency Department (HOSPITAL_COMMUNITY): Payer: PPO

## 2017-02-17 ENCOUNTER — Encounter (HOSPITAL_COMMUNITY): Payer: Self-pay | Admitting: *Deleted

## 2017-02-17 ENCOUNTER — Inpatient Hospital Stay (HOSPITAL_COMMUNITY)
Admission: EM | Admit: 2017-02-17 | Discharge: 2017-02-21 | DRG: 640 | Disposition: A | Discharge status: Home Health | Payer: PPO | Attending: Internal Medicine | Admitting: Internal Medicine

## 2017-02-17 DIAGNOSIS — Z8673 Personal history of transient ischemic attack (TIA), and cerebral infarction without residual deficits: Secondary | ICD-10-CM

## 2017-02-17 DIAGNOSIS — E785 Hyperlipidemia, unspecified: Secondary | ICD-10-CM | POA: Diagnosis present

## 2017-02-17 DIAGNOSIS — R531 Weakness: Secondary | ICD-10-CM

## 2017-02-17 DIAGNOSIS — R4781 Slurred speech: Secondary | ICD-10-CM

## 2017-02-17 DIAGNOSIS — K2970 Gastritis, unspecified, without bleeding: Secondary | ICD-10-CM | POA: Diagnosis not present

## 2017-02-17 DIAGNOSIS — I10 Essential (primary) hypertension: Secondary | ICD-10-CM | POA: Diagnosis not present

## 2017-02-17 DIAGNOSIS — K219 Gastro-esophageal reflux disease without esophagitis: Secondary | ICD-10-CM | POA: Diagnosis present

## 2017-02-17 DIAGNOSIS — D72829 Elevated white blood cell count, unspecified: Secondary | ICD-10-CM | POA: Diagnosis present

## 2017-02-17 DIAGNOSIS — Z66 Do not resuscitate: Secondary | ICD-10-CM | POA: Diagnosis not present

## 2017-02-17 DIAGNOSIS — I110 Hypertensive heart disease with heart failure: Secondary | ICD-10-CM | POA: Diagnosis not present

## 2017-02-17 DIAGNOSIS — Z96641 Presence of right artificial hip joint: Secondary | ICD-10-CM | POA: Diagnosis not present

## 2017-02-17 DIAGNOSIS — K2900 Acute gastritis without bleeding: Secondary | ICD-10-CM | POA: Diagnosis not present

## 2017-02-17 DIAGNOSIS — G459 Transient cerebral ischemic attack, unspecified: Secondary | ICD-10-CM | POA: Diagnosis present

## 2017-02-17 DIAGNOSIS — I5043 Acute on chronic combined systolic (congestive) and diastolic (congestive) heart failure: Secondary | ICD-10-CM | POA: Diagnosis not present

## 2017-02-17 DIAGNOSIS — E871 Hypo-osmolality and hyponatremia: Secondary | ICD-10-CM | POA: Diagnosis not present

## 2017-02-17 DIAGNOSIS — G9341 Metabolic encephalopathy: Secondary | ICD-10-CM | POA: Diagnosis not present

## 2017-02-17 DIAGNOSIS — G9340 Encephalopathy, unspecified: Secondary | ICD-10-CM | POA: Diagnosis not present

## 2017-02-17 DIAGNOSIS — I5023 Acute on chronic systolic (congestive) heart failure: Secondary | ICD-10-CM | POA: Diagnosis not present

## 2017-02-17 DIAGNOSIS — R404 Transient alteration of awareness: Secondary | ICD-10-CM | POA: Diagnosis not present

## 2017-02-17 LAB — CBC
HCT: 34.4 % — ABNORMAL LOW (ref 36.0–46.0)
Hemoglobin: 11.8 g/dL — ABNORMAL LOW (ref 12.0–15.0)
MCH: 24.9 pg — AB (ref 26.0–34.0)
MCHC: 34.3 g/dL (ref 30.0–36.0)
MCV: 72.7 fL — ABNORMAL LOW (ref 78.0–100.0)
PLATELETS: 431 10*3/uL — AB (ref 150–400)
RBC: 4.73 MIL/uL (ref 3.87–5.11)
RDW: 16.9 % — AB (ref 11.5–15.5)
WBC: 11.2 10*3/uL — AB (ref 4.0–10.5)

## 2017-02-17 LAB — URINALYSIS, ROUTINE W REFLEX MICROSCOPIC
BILIRUBIN URINE: NEGATIVE
Glucose, UA: NEGATIVE mg/dL
Hgb urine dipstick: NEGATIVE
Ketones, ur: 5 mg/dL — AB
Leukocytes, UA: NEGATIVE
NITRITE: NEGATIVE
PH: 7 (ref 5.0–8.0)
Protein, ur: NEGATIVE mg/dL
SPECIFIC GRAVITY, URINE: 1.005 (ref 1.005–1.030)

## 2017-02-17 LAB — BASIC METABOLIC PANEL
Anion gap: 8 (ref 5–15)
BUN: 6 mg/dL (ref 6–20)
CO2: 25 mmol/L (ref 22–32)
CREATININE: 0.68 mg/dL (ref 0.44–1.00)
Calcium: 8.3 mg/dL — ABNORMAL LOW (ref 8.9–10.3)
Chloride: 85 mmol/L — ABNORMAL LOW (ref 101–111)
GFR calc Af Amer: >60 mL/min (ref >60)
GLUCOSE: 85 mg/dL (ref 65–99)
POTASSIUM: 4 mmol/L (ref 3.5–5.1)
SODIUM: 118 mmol/L — AB (ref 135–145)

## 2017-02-17 LAB — COMPREHENSIVE METABOLIC PANEL
ALT: 12 U/L — ABNORMAL LOW (ref 14–54)
AST: 21 U/L (ref 15–41)
Albumin: 3.7 g/dL (ref 3.5–5.0)
Alkaline Phosphatase: 47 U/L (ref 38–126)
Anion gap: 9 (ref 5–15)
BUN: 6 mg/dL (ref 6–20)
CHLORIDE: 82 mmol/L — AB (ref 101–111)
CO2: 29 mmol/L (ref 22–32)
CREATININE: 0.71 mg/dL (ref 0.44–1.00)
Calcium: 9 mg/dL (ref 8.9–10.3)
GFR calc non Af Amer: >60 mL/min (ref >60)
Glucose, Bld: 110 mg/dL — ABNORMAL HIGH (ref 65–99)
POTASSIUM: 3.9 mmol/L (ref 3.5–5.1)
SODIUM: 120 mmol/L — AB (ref 135–145)
Total Bilirubin: 1 mg/dL (ref 0.3–1.2)
Total Protein: 6.7 g/dL (ref 6.5–8.1)

## 2017-02-17 LAB — GLUCOSE, CAPILLARY: Glucose-Capillary: 88 mg/dL (ref 65–99)

## 2017-02-17 LAB — BRAIN NATRIURETIC PEPTIDE: B NATRIURETIC PEPTIDE 5: 337.5 pg/mL — AB (ref 0.0–100.0)

## 2017-02-17 MED ORDER — ACETAMINOPHEN 325 MG PO TABS
650.0000 mg | ORAL_TABLET | Freq: Four times a day (QID) | ORAL | Status: DC | PRN
  Administered 2017-02-19 – 2017-02-21 (×2): 650 mg via ORAL
  Filled 2017-02-17 (×2): qty 2

## 2017-02-17 MED ORDER — ENOXAPARIN SODIUM 30 MG/0.3ML ~~LOC~~ SOLN
30.0000 mg | SUBCUTANEOUS | Status: DC
  Administered 2017-02-17 – 2017-02-19 (×3): 30 mg via SUBCUTANEOUS
  Filled 2017-02-17 (×3): qty 0.3

## 2017-02-17 MED ORDER — CALCIUM CARBONATE-VITAMIN D 500-200 MG-UNIT PO TABS
1.0000 | ORAL_TABLET | Freq: Every day | ORAL | Status: DC
  Administered 2017-02-19 – 2017-02-21 (×2): 1 via ORAL
  Filled 2017-02-17 (×3): qty 1

## 2017-02-17 MED ORDER — SODIUM CHLORIDE 0.9 % IV SOLN
1000.0000 mL | Freq: Once | INTRAVENOUS | Status: AC
  Administered 2017-02-17: 1000 mL via INTRAVENOUS

## 2017-02-17 MED ORDER — SODIUM CHLORIDE 0.9 % IV SOLN
INTRAVENOUS | Status: DC
  Administered 2017-02-17: 18:00:00 via INTRAVENOUS

## 2017-02-17 MED ORDER — HYDRALAZINE HCL 50 MG PO TABS
75.0000 mg | ORAL_TABLET | Freq: Three times a day (TID) | ORAL | Status: DC
  Administered 2017-02-18 – 2017-02-21 (×8): 75 mg via ORAL
  Filled 2017-02-17 (×9): qty 1

## 2017-02-17 MED ORDER — SODIUM CHLORIDE 0.9 % IV SOLN
1000.0000 mL | INTRAVENOUS | Status: DC
  Administered 2017-02-17: 1000 mL via INTRAVENOUS

## 2017-02-17 MED ORDER — SENNOSIDES-DOCUSATE SODIUM 8.6-50 MG PO TABS: 1.0000 | ORAL_TABLET | Freq: Every evening | ORAL | Status: DC | PRN

## 2017-02-17 MED ORDER — POLYSACCHARIDE IRON COMPLEX 150 MG PO CAPS
150.0000 mg | ORAL_CAPSULE | Freq: Every day | ORAL | Status: DC
  Administered 2017-02-18 – 2017-02-21 (×3): 150 mg via ORAL
  Filled 2017-02-17 (×4): qty 1

## 2017-02-17 MED ORDER — PRAVASTATIN SODIUM 40 MG PO TABS
40.0000 mg | ORAL_TABLET | Freq: Every day | ORAL | Status: DC
  Administered 2017-02-18 – 2017-02-21 (×4): 40 mg via ORAL
  Filled 2017-02-17 (×4): qty 1

## 2017-02-17 MED ORDER — FUROSEMIDE 10 MG/ML IJ SOLN
40.0000 mg | Freq: Once | INTRAMUSCULAR | Status: AC
  Administered 2017-02-17: 40 mg via INTRAVENOUS
  Filled 2017-02-17: qty 4

## 2017-02-17 MED ORDER — CARVEDILOL 3.125 MG PO TABS
3.1250 mg | ORAL_TABLET | Freq: Two times a day (BID) | ORAL | Status: DC
  Administered 2017-02-18 – 2017-02-21 (×7): 3.125 mg via ORAL
  Filled 2017-02-17 (×7): qty 1

## 2017-02-17 MED ORDER — LORATADINE 10 MG PO TABS
10.0000 mg | ORAL_TABLET | Freq: Every day | ORAL | Status: DC
  Administered 2017-02-18 – 2017-02-21 (×4): 10 mg via ORAL
  Filled 2017-02-17 (×4): qty 1

## 2017-02-17 MED ORDER — SODIUM CHLORIDE 0.9 % IV SOLN: INTRAVENOUS | Status: DC

## 2017-02-17 NOTE — ED Triage Notes (Signed)
amendment to prior note.  Patient did not have elevated ammonia as given in report from EMS.

## 2017-02-17 NOTE — Progress Notes (Signed)
CRITICAL VALUE ALERT  Critical value received: Na 118  Date of notification:  02/17/17  Time of notification:  1932  Critical value read back:Yes.    Nurse who received alert:  Wilson SingerAmber Melton, RN  MD notified (1st page):  A. Hamad  Time of first page:  1939  MD notified (2nd page):  Time of second page:  Responding MD: Hamad  Time MD responded:  1946

## 2017-02-17 NOTE — ED Notes (Signed)
Attempted in and out cath with no success  

## 2017-02-17 NOTE — Progress Notes (Signed)
Report given to April RN at bedside

## 2017-02-17 NOTE — ED Triage Notes (Signed)
Patient is alert with some confusion per family.  [facility] and DC on Friday with elevated ammonia and pneumonia.  Patient has a Hx of CVA and family wanted to have her checked.  Patient CBG 130 per EMS.  Patient denies any pain

## 2017-02-17 NOTE — ED Notes (Signed)
Bed: WA07 Expected date:  Expected time:  Means of arrival:  Comments: EMS 83YO weakness

## 2017-02-17 NOTE — H&P (Addendum)
History and Physical    [name] Fry:[id number] DOB: February 11, 1920 DOA: 02/17/2017  PCP: Wilmer Floor., MD   Patient coming from: Home  Chief Complaint: Slurred Speech, Generalized Weakness  HPI: [name] Fry is a 83 y.o. female with medical history significant of HTN, HLD, GERD, CVA, Arthritis, Diastolic CHF, Upper GIB, Hyponatremia and other comorbids who presented to [facility] with a cc of Slurred Speech. Patient was somnolent but arousable and did not know why she was brought to the hospital but per family she was in her usual state of health this AM and ate her breakfast and family left to go do errands and when they came back noticed that the patient had slurred speech and needed assistance to ambulate. Patient usually ambulates with the assistance of a walker. Because of concern Family brought patient into the ED for evaluation. Of note patient's family states the patient's speech is back to baseline. Patient herself denied any complaints and was sleepy during the examination and encounter. Family also has been stating she has been drinking a lot of water.    ED Course: Had a Head CT done which showed No acute intracranial abnormality and Atrophy with chronic small vessel white matter ischemic disease. Patient had bloodwork done and found to be Hyponatremic. TRH was called to admit patient for Hyponatremia, Slurred Speech, and Generalized Weakness.  Review of Systems: As per HPI otherwise 10 point review of systems negative. Patient denied any complaints and states she has no pain. Family noticed more swelling in her feet.   Past Medical History:  Diagnosis Date  . Arthritis    "arms" (10/13/2016)  . High cholesterol   . Hypertension   . Stroke [facility]) ~ 2015   "swallow difficulties since" (10/13/2016)   Past Surgical History:  Procedure Laterality Date  . ABDOMINAL HERNIA REPAIR    . CATARACT EXTRACTION Right   . HERNIA REPAIR    . HIP ARTHROPLASTY Right 07/10/2014   Procedure: ARTHROPLASTY BIPOLAR HIP;  Surgeon: Cheral Almas, MD;  Location: [facility] OR;  Service: Orthopedics;  Laterality: Right;  . JOINT REPLACEMENT     SOCIAL HISTORY  reports that she has never smoked. She has never used smokeless tobacco. She reports that she does not drink alcohol or use drugs.  No Known Allergies  Family History  Problem Relation Age of Onset  . Dementia Other   . CAD Neg Hx    Prior to Admission medications   Medication Sig Start Date End Date Taking? Authorizing Provider  acetaminophen (TYLENOL) 325 MG tablet Take 650 mg by mouth every 6 (six) hours as needed for mild pain or moderate pain.    Historical Provider, MD  calcium-vitamin D (OSCAL WITH D) 500-200 MG-UNIT per tablet Take 1 tablet by mouth daily with breakfast. Patient taking differently: Take 1 tablet by mouth daily.  07/10/14   Tarry Kos, MD  carvedilol (COREG) 3.125 MG tablet Take 1 tablet (3.125 mg total) by mouth 2 (two) times daily with a meal. 02/11/17   Leroy Sea, MD  cefpodoxime (VANTIN) 200 MG tablet Take 1 tablet (200 mg total) by mouth 2 (two) times daily. 02/11/17   Leroy Sea, MD  Docusate Calcium (STOOL SOFTENER PO) Take 1 capsule by mouth as needed (for constipation).    Historical Provider, MD  furosemide (LASIX) 20 MG tablet Take 1 tablet (20 mg total) by mouth daily. 02/11/17   Leroy Sea, MD  hydrALAZINE (APRESOLINE) 50 MG tablet Take  1.5 tablets (75 mg total) by mouth 3 (three) times daily. 12/26/14   Ames Dura, MD  loratadine (CLARITIN) 10 MG tablet Take 10 mg by mouth daily.    Historical Provider, MD  metroNIDAZOLE (FLAGYL) 500 MG tablet Take 1 tablet (500 mg total) by mouth 3 (three) times daily. 02/11/17   Leroy Sea, MD  Polysaccharide Iron Complex (FERREX 150 PO) Take 150 mg by mouth daily.    Historical Provider, MD  Potassium Chloride ER 20 MEQ TBCR Take 20 mEq by mouth daily. 02/11/17   Leroy Sea, MD  Pramoxine-Camphor-Zinc Acetate (ANTI  ITCH EX) Apply 1 application topically as needed (for itching).    Historical Provider, MD  pravastatin (PRAVACHOL) 40 MG tablet Take 40 mg by mouth daily.    Historical Provider, MD  spironolactone (ALDACTONE) 50 MG tablet Take 50 mg by mouth daily.    Historical Provider, MD   Physical Exam: Vitals:   02/17/17 1600 02/17/17 1631 02/17/17 1709 02/17/17 1801  BP:  (!) 147/83 (!) 166/85 (!) 152/68  Pulse: 71 76 91 88  Resp: (!) 23 18 (!) 22 20  Temp:    98.4 F (36.9 C)  TempSrc:    Oral  SpO2: 100% 100% 100% 99%  Weight:      Height:       Constitutional:  NAD thin elderly female and appears calm and comfortable Eyes: Lids and conjunctivae normal, sclerae anicteric; Arcus senilis noted  ENMT: External Ears, Nose appear normal. Grossly normal hearing.  Neck: Appears normal, supple, no cervical masses, normal ROM, no appreciable thyromegaly Respiratory: Diminished to auscultation bilaterally, no wheezing, rales, rhonchi or crackles. Normal respiratory effort and patient is not tachypenic. No accessory muscle use.  Cardiovascular: RRR, no murmurs / rubs / gallops. S1 and S2 auscultated. 1+-2 Lower Extremity Edema Abdomen: Soft, non-tender, non-distended. No masses palpated. No appreciable hepatosplenomegaly. Bowel sounds positive x4.  GU: Deferred. Musculoskeletal: No clubbing / cyanosis of digits/nails. No joint deformity upper and lower extremities.  Skin: No rashes, lesions, ulcers on limited skin evaluation. No induration; Warm and dry.  Neurologic: CN 2-12 grossly intact with no focal deficits appreciated. Sensation intact in all 4 Extremities. Romberg sign cerebellar reflexes not assessed.  Psychiatric: Normal judgment and insight. Somnolent but arousable but only oriented to self and place.  Normal mood and appropriate affect.   Labs on Admission: I have personally reviewed following labs and imaging studies  CBC:  Recent Labs Lab 02/17/17 1358  WBC 11.2*  HGB 11.8*  HCT  34.4*  MCV 72.7*  PLT 431*   Basic Metabolic Panel:  Recent Labs Lab 02/11/17 0527 02/17/17 1358 02/17/17 1843  NA 131* 120* 118*  K 3.2* 3.9 4.0  CL 94* 82* 85*  CO2 25 29 25   GLUCOSE 81 110* 85  BUN 6 6 6   CREATININE 0.72 0.71 0.68  CALCIUM 8.2* 9.0 8.3*   GFR: Estimated Creatinine Clearance: 30.8 mL/min (by C-G formula based on SCr of 0.68 mg/dL). Liver Function Tests:  Recent Labs Lab 02/17/17 1358  AST 21  ALT 12*  ALKPHOS 47  BILITOT 1.0  PROT 6.7  ALBUMIN 3.7   No results for input(s): LIPASE, AMYLASE in the last 168 hours. No results for input(s): AMMONIA in the last 168 hours. Coagulation Profile: No results for input(s): INR, PROTIME in the last 168 hours. Cardiac Enzymes: No results for input(s): CKTOTAL, CKMB, CKMBINDEX, TROPONINI in the last 168 hours. BNP (last 3 results) No results for input(s):  PROBNP in the last 8760 hours. HbA1C: No results for input(s): HGBA1C in the last 72 hours. CBG:  Recent Labs Lab 02/11/17 0805  GLUCAP 76   Lipid Profile: No results for input(s): CHOL, HDL, LDLCALC, TRIG, CHOLHDL, LDLDIRECT in the last 72 hours. Thyroid Function Tests: No results for input(s): TSH, T4TOTAL, FREET4, T3FREE, THYROIDAB in the last 72 hours. Anemia Panel: No results for input(s): VITAMINB12, FOLATE, FERRITIN, TIBC, IRON, RETICCTPCT in the last 72 hours. Urine analysis:    Component Value Date/Time   COLORURINE YELLOW 02/09/2017 1914   APPEARANCEUR CLEAR 02/09/2017 1914   LABSPEC 1.013 02/09/2017 1914   PHURINE 6.0 02/09/2017 1914   GLUCOSEU NEGATIVE 02/09/2017 1914   HGBUR NEGATIVE 02/09/2017 1914   BILIRUBINUR NEGATIVE 02/09/2017 1914   KETONESUR NEGATIVE 02/09/2017 1914   PROTEINUR NEGATIVE 02/09/2017 1914   UROBILINOGEN 0.2 12/22/2014 0903   NITRITE NEGATIVE 02/09/2017 1914   LEUKOCYTESUR TRACE (A) 02/09/2017 1914   Sepsis Labs:  !!!!!!!!!!!!!!!!!!!!!!!!!!!!!!!!!!!!!!!!!!!! @LABRCNTIP (procalcitonin:4,lacticidven:4) ) Recent Results (from the past 240 hour(s))  Urine culture     Status: Abnormal   Collection Time: 02/09/17  7:14 PM  Result Value Ref Range Status   Specimen Description URINE, RANDOM  Final   Special Requests NONE  Final   Culture <10,000 COLONIES/mL INSIGNIFICANT GROWTH (A)  Final   Report Status 02/10/2017 FINAL  Final    Radiological Exams on Admission: Dg Chest 2 View  Result Date: 02/17/2017 CLINICAL DATA:  Generalized weakness, slurred speech EXAM: CHEST  2 VIEW COMPARISON:  Chest x-ray and chest CT 02/09/2017 FINDINGS: Cardiac enlargement.  Negative for heart failure. Right lower lobe lung density unchanged. Right lower lobe consolidation and small effusion unchanged. Mild left lower lobe airspace disease. Calcified pleural plaque on the left unchanged. Atherosclerotic aorta. Degenerative change and rotator cuff disease bilaterally in both shoulders. IMPRESSION: Bibasilar airspace disease unchanged. Right lower lobe lung density unchanged. No new findings. Electronically Signed   By: Marlan Palau M.D.   On: 02/17/2017 14:22   Ct Head Wo Contrast  Result Date: 02/17/2017 CLINICAL DATA:  Generalized weakness and slurred speech. EXAM: CT HEAD WITHOUT CONTRAST TECHNIQUE: Contiguous axial images were obtained from the base of the skull through the vertex without intravenous contrast. COMPARISON:  02/09/2017 FINDINGS: Brain: There is no evidence for acute hemorrhage, hydrocephalus, mass lesion, or abnormal extra-axial fluid collection. No definite CT evidence for acute infarction. Diffuse loss of parenchymal volume is consistent with atrophy. Patchy low attenuation in the deep hemispheric and periventricular white matter is nonspecific, but likely reflects chronic microvascular ischemic demyelination. Vascular: Atherosclerotic calcification is visualized in the intracranial segments of the internal carotid  arteries. No dense MCA sign. Major dural sinuses are unremarkable. Skull: No evidence for fracture. No worrisome lytic or sclerotic lesion. Sinuses/Orbits: The visualized paranasal sinuses and mastoid air cells are clear. Visualized portions of the globes and intraorbital fat are unremarkable. Other: None. IMPRESSION: 1. Stable.  No acute intracranial abnormality. 2. Atrophy with chronic small vessel white matter ischemic disease. Electronically Signed   By: Kennith Center M.D.   On: 02/17/2017 14:49   EKG: Independently reviewed. Showed Sinus Rhythm with RBBB and no evidence of ST Elevation or Depression on my interpretation.   Assessment/Plan Principal Problem:   Slurred speech Active Problems:   HTN (hypertension)   HLD (hyperlipidemia)   Leukocytosis   Acute hyponatremia   Generalized weakness   Encephalopathy  Slurred Speech r/o TIA/CVA -Place in Obs Telemetry -Was not slurring speech on exam and family  stated she was at baseline but weak; Patient answering questions appropriately but just sleepy -Slurred Speech was transient and has resolved -Will obtain MRI/MRA of Head; Head CT showed No acute intracranial abnormality and Atrophy with chronic small vessel white matter ischemic disease. -Check ECHOcardiogram -Obtain Swallow Screen and SLP Eval -PT and OT Evaluations -Did not appreciate any focal deficits on exam  Generalized Weakness -PT/OT Evaluate and Treat; Recently hospitalized with UTI  Hyponatremia -Was given 1 Liter of IVF in ED; Will discontinue IVF rehydration at this point and restrict fluid and Diurese  -Likely in the the setting of Volume Overload possibly Hypervolemic Hyponatremia -Patient appeared to have some volume overload on exam -Repeat BMP this Evening showed Na+ Dropped even lower to 118 after fluid bolus and in AM -Continue to Monitor Closely  Mild Leukocytosis -WBC was 11.2 -Recently was treated for a Necrotizing Pneumonia vs Lung Mass and completed  Abx Course -Per recent Discharge she was supposed to have her PCP do an age-appropriate workup with full understanding that that is not a candidate for chemotherapy, radiation, or surgery -CXR showed Bibasilar airspace disease unchanged. Right lower lobe lung density unchanged. No new findings. -Patient is s/p Abx Completion yesterady -Continue to Monitor and repeat CBC in AM  Dyslipidemia -C/w Statin  Hypertension -C/w Hydralazine and Coreg  Suspect Acute Decompensation on Chronic Diastolic CHF -Family reports significant water intake -Last ECHO in 2016 showed Grade 2 Diastolic CHF; Repeat ECHO -Was on Diuretics as an outpatient; Will start with IV 40 mg Lasix and re-evaluate BMP in AM -Monitor Volume Status -Strict I's and O's and Daily Weights -BNP last hospitalization was 470.4 -Repeat BNP this visit  GERD -Patient no longer taking Protonix -Continue to Monitor  DVT prophylaxis: Lovenox 30 mg sq Code Status: DO NOT RESUSCITATE Family Communication: Discussed with Daughter and Granddaughter at bedside Disposition Plan: Pending PT Eval Consults called: None Admission status: Obs -Telemetry  [employer]mair Latif Maripat Borba, D.O. [facility] Pager [phone]  If 7PM-7AM, please contact night-coverage [email or web address] Password [email or web address]  02/17/2017, 7:48 PM

## 2017-02-17 NOTE — ED Notes (Signed)
4th floor made aware of delay in getting patient up d/t receiving an EMS in another room at this time.

## 2017-02-17 NOTE — ED Provider Notes (Addendum)
[facility]-EMERGENCY DEPT Provider Note   CSN: [id number] Arrival date & time: 02/17/17  1251     History   Chief Complaint Chief Complaint  Patient presents with  . Altered Mental Status    HPI [name] Fry is a 83 y.o. female.  HPI Patient presents the emergency Department for reported generalized weakness and confusion by family today.  They stated to 15 she seemed generally weak but was otherwise normal and when they returned at approximately 11:15 she seemed to have slightly slurred speech which now has resolved.  She does have a history of TIA and stroke before.  Family reports returned to baseline mental status at this time.  She did recently have a bout of pneumonia which she is finishing antibiotics for.  No increasing shortness of breath or cough.  Family reports that she recently had an evaluation in the hospital for severe hyponatremia.  Patient denies chest pain shortness of breath at this time but no abdominal pain.  No neck pain.  Denies weakness of her arms or legs.  Family reports speech is normal   Past Medical History:  Diagnosis Date  . Arthritis    "arms" (10/13/2016)  . High cholesterol   . Hypertension   . Stroke [facility]) ~ 2015   "swallow difficulties since" (10/13/2016)    Patient Active Problem List   Diagnosis Date Noted  . Acute encephalopathy 02/09/2017  . Pleural effusion   . Goals of care, counseling/discussion   . Palliative care encounter   . Abdominal pain   . Upper GI bleed   . Generalized weakness   . Hyponatremia 10/08/2016  . Acute hyponatremia 10/08/2016  . Chronic anemia 12/23/2014  . Leukocytosis 12/23/2014  . History of pleural effusion 12/23/2014  . Aphasia 12/22/2014  . TIA (transient ischemic attack) 12/22/2014  . History of CVA (cerebrovascular accident) without residual deficits 12/22/2014  . Vitamin D deficiency 12/22/2014  . Allergic rhinitis 12/22/2014  . Osteoporosis 12/22/2014  . Protein-calorie malnutrition, severe  (HCC) 07/11/2014  . Femoral neck fracture (HCC) 07/10/2014  . HTN (hypertension) 07/10/2014  . HLD (hyperlipidemia) 07/10/2014  . Closed right hip fracture (HCC) 07/10/2014    Past Surgical History:  Procedure Laterality Date  . ABDOMINAL HERNIA REPAIR    . CATARACT EXTRACTION Right   . HERNIA REPAIR    . HIP ARTHROPLASTY Right 07/10/2014   Procedure: ARTHROPLASTY BIPOLAR HIP;  Surgeon: Cheral Almas, MD;  Location: [facility];  Service: Orthopedics;  Laterality: Right;  . JOINT REPLACEMENT      OB History    No data available       Home Medications    Prior to Admission medications   Medication Sig Start Date End Date Taking? Authorizing Provider  acetaminophen (TYLENOL) 325 MG tablet Take 650 mg by mouth every 6 (six) hours as needed for mild pain or moderate pain.    Historical Provider, MD  calcium-vitamin D (OSCAL WITH D) 500-200 MG-UNIT per tablet Take 1 tablet by mouth daily with breakfast. Patient taking differently: Take 1 tablet by mouth daily.  07/10/14   Tarry Kos, MD  carvedilol (COREG) 3.125 MG tablet Take 1 tablet (3.125 mg total) by mouth 2 (two) times daily with a meal. 02/11/17   Leroy Sea, MD  cefpodoxime (VANTIN) 200 MG tablet Take 1 tablet (200 mg total) by mouth 2 (two) times daily. 02/11/17   Leroy Sea, MD  Docusate Calcium (STOOL SOFTENER PO) Take 1 capsule by mouth as needed (  for constipation).    Historical Provider, MD  feeding supplement, ENSURE COMPLETE, (ENSURE COMPLETE) LIQD Take 237 mLs by mouth 2 (two) times daily between meals. Patient not taking: Reported on 10/08/2016 07/12/14   Richarda Overlie, MD  furosemide (LASIX) 20 MG tablet Take 1 tablet (20 mg total) by mouth daily. 02/11/17   Leroy Sea, MD  hydrALAZINE (APRESOLINE) 50 MG tablet Take 1.5 tablets (75 mg total) by mouth 3 (three) times daily. 12/26/14   Ames Dura, MD  loratadine (CLARITIN) 10 MG tablet Take 10 mg by mouth daily.    Historical Provider, MD  metroNIDAZOLE  (FLAGYL) 500 MG tablet Take 1 tablet (500 mg total) by mouth 3 (three) times daily. 02/11/17   Leroy Sea, MD  ondansetron (ZOFRAN) 4 MG tablet Take 1 tablet (4 mg total) by mouth every 6 (six) hours. Patient not taking: Reported on 10/08/2016 12/26/14   Ames Dura, MD  pantoprazole (PROTONIX) 40 MG tablet Take 1 tablet (40 mg total) by mouth 2 (two) times daily. Patient not taking: Reported on 02/09/2017 10/13/16   Wendee Beavers, DO  pantoprazole (PROTONIX) 40 MG tablet Take 1 tablet (40 mg total) by mouth 2 (two) times daily. Patient not taking: Reported on 02/09/2017 10/18/16   Tillman Sers, DO  polyethylene glycol (MIRALAX / GLYCOLAX) packet Take 17 g by mouth daily as needed for mild constipation. Patient not taking: Reported on 10/08/2016 07/12/14   Richarda Overlie, MD  Polysaccharide Iron Complex (FERREX 150 PO) Take 150 mg by mouth daily.    Historical Provider, MD  Potassium Chloride ER 20 MEQ TBCR Take 20 mEq by mouth daily. 02/11/17   Leroy Sea, MD  Pramoxine-Camphor-Zinc Acetate (ANTI ITCH EX) Apply 1 application topically as needed (for itching).    Historical Provider, MD  pravastatin (PRAVACHOL) 40 MG tablet Take 40 mg by mouth daily.    Historical Provider, MD  senna-docusate (SENOKOT S) 8.6-50 MG per tablet Take 1 tablet by mouth at bedtime as needed. Patient not taking: Reported on 10/08/2016 07/10/14   Tarry Kos, MD  spironolactone (ALDACTONE) 50 MG tablet Take 50 mg by mouth daily.    Historical Provider, MD    Family History Family History  Problem Relation Age of Onset  . Dementia Other   . CAD Neg Hx     Social History Social History  Substance Use Topics  . Smoking status: Never Smoker  . Smokeless tobacco: Never Used  . Alcohol use No     Allergies   Patient has no known allergies.   Review of Systems Review of Systems  All other systems reviewed and are negative.    Physical Exam Updated Vital Signs BP (!) 164/104 (BP Location:  Left Arm)   Pulse 75   Temp 98.1 F (36.7 C) (Axillary)   Resp 18   Ht 5' (1.524 m)   Wt 100 lb (45.4 kg)   SpO2 99%   BMI 19.53 kg/m   Physical Exam  Constitutional: She is oriented to person, place, and time. She appears well-developed and well-nourished. No distress.  HENT:  Head: Normocephalic and atraumatic.  Eyes: EOM are normal. Pupils are equal, round, and reactive to light.  Neck: Normal range of motion.  Cardiovascular: Normal rate, regular rhythm and normal heart sounds.   Pulmonary/Chest: Effort normal and breath sounds normal.  Abdominal: Soft. She exhibits no distension. There is no tenderness.  Musculoskeletal: Normal range of motion.  Neurological: She is alert and oriented  to person, place, and time.  5/5 strength in major muscle groups of  bilateral upper and lower extremities. Speech normal. No facial asymetry.   Skin: Skin is warm and dry.  Psychiatric: She has a normal mood and affect. Judgment normal.  Nursing note and vitals reviewed.    ED Treatments / Results  Labs (all labs ordered are listed, but only abnormal results are displayed) Labs Reviewed  CBC - Abnormal; Notable for the following:       Result Value   WBC 11.2 (*)    Hemoglobin 11.8 (*)    HCT 34.4 (*)    MCV 72.7 (*)    MCH 24.9 (*)    RDW 16.9 (*)    Platelets 431 (*)    All other components within normal limits  COMPREHENSIVE METABOLIC PANEL - Abnormal; Notable for the following:    Sodium 120 (*)    Chloride 82 (*)    Glucose, Bld 110 (*)    ALT 12 (*)    All other components within normal limits  URINALYSIS, ROUTINE W REFLEX MICROSCOPIC    Sodium  Date Value Ref Range Status  02/17/2017 120 (L) 135 - 145 mmol/L Final  02/11/2017 131 (L) 135 - 145 mmol/L Final  02/10/2017 132 (L) 135 - 145 mmol/L Final  02/10/2017 130 (L) 135 - 145 mmol/L Final     EKG  EKG Interpretation  Date/Time:  Thursday February 17 2017 14:03:47 EDT Ventricular Rate:  78 PR Interval:    QRS  Duration: 135 QT Interval:  456 QTC Calculation: 520 R Axis:   -62 Text Interpretation:  Sinus rhythm Probable left atrial enlargement RBBB and LAFB Left ventricular hypertrophy ST elevation, consider inferior injury No significant change was found Confirmed by Won Kreuzer  MD, Kateena Degroote ([id number]) on 02/17/2017 3:25:11 PM       Radiology Dg Chest 2 View  Result Date: 02/17/2017 CLINICAL DATA:  Generalized weakness, slurred speech EXAM: CHEST  2 VIEW COMPARISON:  Chest x-ray and chest CT 02/09/2017 FINDINGS: Cardiac enlargement.  Negative for heart failure. Right lower lobe lung density unchanged. Right lower lobe consolidation and small effusion unchanged. Mild left lower lobe airspace disease. Calcified pleural plaque on the left unchanged. Atherosclerotic aorta. Degenerative change and rotator cuff disease bilaterally in both shoulders. IMPRESSION: Bibasilar airspace disease unchanged. Right lower lobe lung density unchanged. No new findings. Electronically Signed   By: Marlan Palau M.D.   On: 02/17/2017 14:22   Ct Head Wo Contrast  Result Date: 02/17/2017 CLINICAL DATA:  Generalized weakness and slurred speech. EXAM: CT HEAD WITHOUT CONTRAST TECHNIQUE: Contiguous axial images were obtained from the base of the skull through the vertex without intravenous contrast. COMPARISON:  02/09/2017 FINDINGS: Brain: There is no evidence for acute hemorrhage, hydrocephalus, mass lesion, or abnormal extra-axial fluid collection. No definite CT evidence for acute infarction. Diffuse loss of parenchymal volume is consistent with atrophy. Patchy low attenuation in the deep hemispheric and periventricular white matter is nonspecific, but likely reflects chronic microvascular ischemic demyelination. Vascular: Atherosclerotic calcification is visualized in the intracranial segments of the internal carotid arteries. No dense MCA sign. Major dural sinuses are unremarkable. Skull: No evidence for fracture. No worrisome lytic or  sclerotic lesion. Sinuses/Orbits: The visualized paranasal sinuses and mastoid air cells are clear. Visualized portions of the globes and intraorbital fat are unremarkable. Other: None. IMPRESSION: 1. Stable.  No acute intracranial abnormality. 2. Atrophy with chronic small vessel white matter ischemic disease. Electronically Signed   By: Minerva Areola  Molli PoseyMansell M.D.   On: 02/17/2017 14:49    Procedures Procedures (including critical care time)     ++++++++++++++++++++++++++++++++++++++++++++  CRITICAL CARE Performed by: Lyanne CoAMPOS,See Beharry M Total critical care time: 30 minutes Critical care time was exclusive of separately billable procedures and treating other patients. Critical care was necessary to treat or prevent imminent or life-threatening deterioration. Critical care was time spent personally by me on the following activities: development of treatment plan with patient and/or surrogate as well as nursing, discussions with consultants, evaluation of patient's response to treatment, examination of patient, obtaining history from patient or surrogate, ordering and performing treatments and interventions, ordering and review of laboratory studies, ordering and review of radiographic studies, pulse oximetry and re-evaluation of patient's condition.  ++++++++++++++++++++++++++++++++++++++++++++++++++++++   Medications Ordered in ED Medications  0.9 %  sodium chloride infusion (not administered)    Followed by  0.9 %  sodium chloride infusion (not administered)     Initial Impression / Assessment and Plan / ED Course  I have reviewed the triage vital signs and the nursing notes.  Pertinent labs & imaging results that were available during my care of the patient were reviewed by me and considered in my medical decision making (see chart for details).     Head CT without acute abnormality.  Chest x-ray without any changes.  Chest x-ray continues to show pneumonia but clinically this is resolving.   Generalized weakness.  I question whether or not the patient could've had a small TIA with her transient slurred speech but she is now returned to baseline mental status and baseline speech.   3:44 PM Patient with evidence of severe hyponatremia with sodium 120.  When she was discharged.  Today her sodium is up to 131.  This definitely could be because of her altered mental status and confusion today as well as her generalized weakness.  Patient be admitted for treatment of hyponatremia.  Final Clinical Impressions(s) / ED Diagnoses   Final diagnoses:  Acute encephalopathy  Hyponatremia     New Prescriptions New Prescriptions   No medications on file     Azalia BilisKevin Wanetta Funderburke, MD 02/17/17 1545    Azalia BilisKevin Eljay Lave, MD 02/17/17 1546

## 2017-02-18 ENCOUNTER — Observation Stay (HOSPITAL_BASED_OUTPATIENT_CLINIC_OR_DEPARTMENT_OTHER): Payer: PPO

## 2017-02-18 ENCOUNTER — Observation Stay (HOSPITAL_COMMUNITY): Payer: PPO

## 2017-02-18 DIAGNOSIS — Z8673 Personal history of transient ischemic attack (TIA), and cerebral infarction without residual deficits: Secondary | ICD-10-CM | POA: Diagnosis not present

## 2017-02-18 DIAGNOSIS — G459 Transient cerebral ischemic attack, unspecified: Secondary | ICD-10-CM | POA: Diagnosis not present

## 2017-02-18 DIAGNOSIS — G9340 Encephalopathy, unspecified: Secondary | ICD-10-CM | POA: Diagnosis not present

## 2017-02-18 DIAGNOSIS — I5023 Acute on chronic systolic (congestive) heart failure: Secondary | ICD-10-CM | POA: Diagnosis not present

## 2017-02-18 DIAGNOSIS — R55 Syncope and collapse: Secondary | ICD-10-CM | POA: Diagnosis not present

## 2017-02-18 DIAGNOSIS — I110 Hypertensive heart disease with heart failure: Secondary | ICD-10-CM | POA: Diagnosis not present

## 2017-02-18 DIAGNOSIS — G9341 Metabolic encephalopathy: Secondary | ICD-10-CM | POA: Diagnosis not present

## 2017-02-18 DIAGNOSIS — R531 Weakness: Secondary | ICD-10-CM | POA: Diagnosis not present

## 2017-02-18 DIAGNOSIS — Z66 Do not resuscitate: Secondary | ICD-10-CM | POA: Diagnosis not present

## 2017-02-18 DIAGNOSIS — Z96641 Presence of right artificial hip joint: Secondary | ICD-10-CM | POA: Diagnosis not present

## 2017-02-18 DIAGNOSIS — R4781 Slurred speech: Secondary | ICD-10-CM | POA: Diagnosis not present

## 2017-02-18 DIAGNOSIS — K2900 Acute gastritis without bleeding: Secondary | ICD-10-CM | POA: Diagnosis not present

## 2017-02-18 DIAGNOSIS — E871 Hypo-osmolality and hyponatremia: Secondary | ICD-10-CM | POA: Diagnosis not present

## 2017-02-18 DIAGNOSIS — E785 Hyperlipidemia, unspecified: Secondary | ICD-10-CM | POA: Diagnosis not present

## 2017-02-18 DIAGNOSIS — I5043 Acute on chronic combined systolic (congestive) and diastolic (congestive) heart failure: Secondary | ICD-10-CM | POA: Diagnosis not present

## 2017-02-18 DIAGNOSIS — I10 Essential (primary) hypertension: Secondary | ICD-10-CM | POA: Diagnosis not present

## 2017-02-18 DIAGNOSIS — K219 Gastro-esophageal reflux disease without esophagitis: Secondary | ICD-10-CM | POA: Diagnosis not present

## 2017-02-18 DIAGNOSIS — K2970 Gastritis, unspecified, without bleeding: Secondary | ICD-10-CM | POA: Diagnosis not present

## 2017-02-18 LAB — LIPID PANEL
CHOL/HDL RATIO: 1.9 ratio
Cholesterol: 126 mg/dL (ref 0–200)
HDL: 66 mg/dL (ref >40)
LDL Cholesterol: 42 mg/dL (ref 0–99)
Triglycerides: 91 mg/dL (ref <150)
VLDL: 18 mg/dL (ref 0–40)

## 2017-02-18 LAB — ECHOCARDIOGRAM COMPLETE
CHL CUP DOP CALC LVOT VTI: 15.1 cm
CHL CUP MV DEC (S): 275
EERAT: 21.62
EWDT: 275 ms
FS: 20 % — AB (ref 28–44)
Height: 60 in
IVS/LV PW RATIO, ED: 0.86
LA diam end sys: 36 mm
LADIAMINDEX: 2.51 cm/m2
LASIZE: 36 mm
LAVOL: 61.2 mL
LAVOLA4C: 46.8 mL
LAVOLIN: 42.7 mL/m2
LV E/e' medial: 21.62
LV PW d: 9.64 mm — AB (ref 0.6–1.1)
LV e' LATERAL: 3.7 cm/s
LVEEAVG: 21.62
LVOT SV: 47 mL
LVOT area: 3.14 cm2
LVOT peak vel: 70.2 cm/s
LVOTD: 20 mm
MV Peak grad: 3 mmHg
MV pk A vel: 150 m/s
MVPKEVEL: 80 m/s
Reg peak vel: 223 cm/s
TDI e' lateral: 3.7
TDI e' medial: 4.24
TRMAXVEL: 223 cm/s
WEIGHTICAEL: 1703.71 [oz_av]

## 2017-02-18 LAB — COMPREHENSIVE METABOLIC PANEL
ALT: 10 U/L — AB (ref 14–54)
AST: 19 U/L (ref 15–41)
Albumin: 3.1 g/dL — ABNORMAL LOW (ref 3.5–5.0)
Alkaline Phosphatase: 36 U/L — ABNORMAL LOW (ref 38–126)
Anion gap: 9 (ref 5–15)
BUN: 7 mg/dL (ref 6–20)
CHLORIDE: 89 mmol/L — AB (ref 101–111)
CO2: 26 mmol/L (ref 22–32)
Calcium: 8.2 mg/dL — ABNORMAL LOW (ref 8.9–10.3)
Creatinine, Ser: 0.7 mg/dL (ref 0.44–1.00)
Glucose, Bld: 76 mg/dL (ref 65–99)
POTASSIUM: 3.4 mmol/L — AB (ref 3.5–5.1)
SODIUM: 124 mmol/L — AB (ref 135–145)
Total Bilirubin: 0.9 mg/dL (ref 0.3–1.2)
Total Protein: 5.4 g/dL — ABNORMAL LOW (ref 6.5–8.1)

## 2017-02-18 LAB — BASIC METABOLIC PANEL
Anion gap: 8 (ref 5–15)
Anion gap: 8 (ref 5–15)
BUN: 7 mg/dL (ref 6–20)
BUN: 6 mg/dL (ref 6–20)
CALCIUM: 8.3 mg/dL — AB (ref 8.9–10.3)
CHLORIDE: 90 mmol/L — AB (ref 101–111)
CHLORIDE: 88 mmol/L — AB (ref 101–111)
CO2: 25 mmol/L (ref 22–32)
CO2: 28 mmol/L (ref 22–32)
CREATININE: 0.84 mg/dL (ref 0.44–1.00)
CREATININE: 0.68 mg/dL (ref 0.44–1.00)
Calcium: 8.1 mg/dL — ABNORMAL LOW (ref 8.9–10.3)
GFR calc Af Amer: >60 mL/min (ref >60)
GFR calc non Af Amer: 58 mL/min — ABNORMAL LOW (ref >60)
GLUCOSE: 78 mg/dL (ref 65–99)
Glucose, Bld: 131 mg/dL — ABNORMAL HIGH (ref 65–99)
Potassium: 3.4 mmol/L — ABNORMAL LOW (ref 3.5–5.1)
Potassium: 3.7 mmol/L (ref 3.5–5.1)
SODIUM: 126 mmol/L — AB (ref 135–145)
SODIUM: 121 mmol/L — AB (ref 135–145)

## 2017-02-18 LAB — CBC WITH DIFFERENTIAL/PLATELET
Basophils Absolute: 0 10*3/uL (ref 0.0–0.1)
Basophils Relative: 0 %
EOS ABS: 0.1 10*3/uL (ref 0.0–0.7)
EOS PCT: 1 %
HCT: 30.3 % — ABNORMAL LOW (ref 36.0–46.0)
Hemoglobin: 10.6 g/dL — ABNORMAL LOW (ref 12.0–15.0)
LYMPHS ABS: 2.5 10*3/uL (ref 0.7–4.0)
LYMPHS PCT: 26 %
MCH: 25.4 pg — AB (ref 26.0–34.0)
MCHC: 35 g/dL (ref 30.0–36.0)
MCV: 72.7 fL — AB (ref 78.0–100.0)
MONO ABS: 1.6 10*3/uL — AB (ref 0.1–1.0)
Monocytes Relative: 17 %
Neutro Abs: 5.4 10*3/uL (ref 1.7–7.7)
Neutrophils Relative %: 56 %
PLATELETS: 379 10*3/uL (ref 150–400)
RBC: 4.17 MIL/uL (ref 3.87–5.11)
RDW: 17 % — AB (ref 11.5–15.5)
WBC: 9.6 10*3/uL (ref 4.0–10.5)

## 2017-02-18 LAB — OSMOLALITY, URINE: OSMOLALITY UR: 275 mosm/kg — AB (ref 300–900)

## 2017-02-18 LAB — MAGNESIUM: MAGNESIUM: 1.3 mg/dL — AB (ref 1.7–2.4)

## 2017-02-18 LAB — OSMOLALITY: OSMOLALITY: 257 mosm/kg — AB (ref 275–295)

## 2017-02-18 LAB — PHOSPHORUS: PHOSPHORUS: 2.5 mg/dL (ref 2.5–4.6)

## 2017-02-18 MED ORDER — SODIUM CHLORIDE 0.9 % IV SOLN
INTRAVENOUS | Status: DC
  Administered 2017-02-18 (×2): via INTRAVENOUS

## 2017-02-18 MED ORDER — POTASSIUM CHLORIDE CRYS ER 20 MEQ PO TBCR
40.0000 meq | EXTENDED_RELEASE_TABLET | Freq: Once | ORAL | Status: AC
  Administered 2017-02-18: 40 meq via ORAL
  Filled 2017-02-18: qty 2

## 2017-02-18 NOTE — Evaluation (Signed)
Occupational Therapy Evaluation Patient Details Name: HAILI DONOFRIO MRN: [id number] DOB: [birth year] Today's Date: 02/18/2017    History of Present Illness 83 yo female admitted with slurred speech. Hx of TIA, CVA, osteoporosis, R hip THA-post 2015, CHF,    Clinical Impression   Pt was admitted for the above.  She is normally mod I for adls at baseline and lives with family.  Pt currently needs min guard to min A.  She will benefit from continued OT to increase safety and independence with adls.  Goals are for supervision in acute.    Follow Up Recommendations  Supervision/Assistance - 24 hour    Equipment Recommendations  None recommended by OT    Recommendations for Other Services       Precautions / Restrictions Precautions Precautions: Fall Restrictions Weight Bearing Restrictions: No      Mobility Bed Mobility   Bed Mobility: Sit to Supine       Sit to supine: Min assist   General bed mobility comments: for legs  Transfers   Equipment used: Rolling walker (2 wheeled)   Sit to Stand: Min guard         General transfer comment: for safety.  cues for UE placement    Balance                                           ADL either performed or assessed with clinical judgement   ADL Overall ADL's : Needs assistance/impaired Eating/Feeding: Independent   Grooming: Oral care;Supervision/safety;Standing   Upper Body Bathing: Set up;Sitting   Lower Body Bathing: Minimal assistance;Sit to/from stand   Upper Body Dressing : Set up;Sitting   Lower Body Dressing: Minimal assistance;Sit to/from stand   Toilet Transfer: Min guard;BSC;Ambulation   Toileting- Architect and Hygiene: Min guard;Sit to/from stand         General ADL Comments: ambulated to bathroom and returned to bed at end of session.  Family present     Vision         Perception     Praxis      Pertinent Vitals/Pain Pain Assessment: No/denies pain      Hand Dominance     Extremity/Trunk Assessment Upper Extremity Assessment Upper Extremity Assessment: Generalized weakness           Communication Communication Communication: No difficulties   Cognition Arousal/Alertness: Awake/alert Behavior During Therapy: WFL for tasks assessed/performed Overall Cognitive Status: Impaired/Different from baseline Area of Impairment: Safety/judgement;Orientation                 Orientation Level: Disoriented to;Time       Safety/Judgement: Decreased awareness of safety         General Comments       Exercises     Shoulder Instructions      Home Living Family/patient expects to be discharged to:: Private residence Living Arrangements: Children;Other relatives Available Help at Discharge: Family;Available 24 hours/day               Bathroom Shower/Tub: Chief Strategy Officer: Standard     Home Equipment: Environmental consultant - 2 wheels;Bedside commode;Shower seat          Prior Functioning/Environment    Gait / Transfers Assistance Needed: Uses RW for community ambulation and independent for household ambulation. ADL's / Homemaking Assistance Needed: Family does all IADLs and drives.  OT Problem List: Decreased strength;Decreased activity tolerance;Impaired balance (sitting and/or standing);Decreased knowledge of use of DME or AE;Decreased safety awareness      OT Treatment/Interventions: Self-care/ADL training;DME and/or AE instruction;Therapeutic activities;Balance training;Patient/family education    OT Goals(Current goals can be found in the care plan section) Acute Rehab OT Goals Patient Stated Goal: home OT Goal Formulation: With patient/family Time For Goal Achievement: 02/25/17 Potential to Achieve Goals: Good ADL Goals Pt Will Transfer to Toilet: with supervision;ambulating;bedside commode Pt Will Perform Toileting - Clothing Manipulation and hygiene: with supervision;sit  to/from stand Additional ADL Goal #1: pt will complete ADL with set up/supervision sit to stand  OT Frequency: Min 2X/week   Barriers to D/C:            Co-evaluation              End of Session    Activity Tolerance: Patient tolerated treatment well Patient left: in bed;with call bell/phone within reach;with bed alarm set  OT Visit Diagnosis: Unsteadiness on feet (R26.81);Muscle weakness (generalized) (M62.81)                Time: 1610-9604 OT Time Calculation (min): 27 min Charges:  OT General Charges $OT Visit: 1 Procedure OT Evaluation $OT Eval Low Complexity: 1 Procedure OT Treatments $Self Care/Home Management : 8-22 mins G-Codes: OT G-codes **NOT FOR INPATIENT CLASS** Functional Assessment Tool Used: Clinical judgement;AM-PAC 6 Clicks Daily Activity Functional Limitation: Self care Self Care Current Status (V4098): At least 20 percent but less than 40 percent impaired, limited or restricted Self Care Goal Status (J1914): At least 1 percent but less than 20 percent impaired, limited or restricted   Marica Otter, OTR/L [phone] 02/18/2017  Avis Mcmahill 02/18/2017, 3:22 PM

## 2017-02-18 NOTE — Care Management Note (Signed)
Case Management Note  Patient Details  Name: [name] Fry MRN: [id number] Date of Birth: 1920/10/02  Subjective/Objective:  83 y/o f admitted w/Slurred speech,hyponatremia. From home w/family support. Hx: DNR. PT-recc supv/HHPT. Await HHc orders. Also provided w/private sitter list as resource.Spoke to patient/grand dtr in rm about choice-chose [facility]-rep Kim aware & following. Patient address:[address] [place] [address].                  Action/Plan:d/c plan home w/HHC.   Expected Discharge Date:   (unknown)               Expected Discharge Plan:  Home w Home Health Services  In-House Referral:     Discharge planning Services  CM Consult  Post Acute Care Choice:    Choice offered to:  Adult Children  DME Arranged:    DME Agency:     HH Arranged:    HH Agency:  [facility]  Status of Service:  In process, will continue to follow  If discussed at Long Length of Stay Meetings, dates discussed:    Additional Comments:  Lanier Clam, RN 02/18/2017, 12:44 PM

## 2017-02-18 NOTE — Progress Notes (Signed)
PROGRESS NOTE    FALLON HAECKER  YNW:[id number] DOB: [birth year] DOA: 02/17/2017 PCP: Wilmer Floor., MD    Brief Narrative:  83 yo female who presented with the chief complain of slurred speech and generalized weakness. Recent hospitalization for pneumonia. Acute worsening and functional status. Blood pressure 160 systolic. Trace lower extremity edema. Admitted to rule out ischemic event, complicated with hyponatremia.    Assessment & Plan:   Principal Problem:   Slurred speech Active Problems:   HTN (hypertension)   HLD (hyperlipidemia)   Leukocytosis   Acute hyponatremia   Generalized weakness   Encephalopathy   1. Symptomatic hypoNatremia. Patient received IV fluids in the ED with signs of volume overload, had furosemide IV last night. This am more euvolemic. Will check serum osmolality and urine osmolality, note patient had furosemide last night. Follow on BMP at 1400. Patient may benefit from gentle saline.   2. Metabolic encephalopathy. Patient continue to be confused and disorientated, multifactorial, will continue to correct electrolytes, will follow on physical therapy evaluation.   3. Diastolic heart failure. Hold on further diuresis for now, continue blood pressure control. Continue coreg.   4, HTN. Continue blood pressure control. Continue hydralazine.   5, GERD. Continue antiacid therapy.     DVT prophylaxis: enoxaparin  Code Status: dnr  Family Communication: I spoke with patient's family at the bedside and all questions were addressed. Disposition Plan: Home with home health  Consultants:     Procedures:   Antimicrobials:    Subjective: Patient with no chest pain or dyspnea, no nausea or vomiting. No diarrhea or abdominal pain.   Objective: Vitals:   02/17/17 2132 02/18/17 0051 02/18/17 0432 02/18/17 0911  BP: 132/67 124/66 124/61 (!) 107/55  Pulse: 80 76 70 69  Resp: Temp: 98.7 F (37.1 C) 98.1 F (36.7 C) 97.7 F (36.5  C) 98.3 F (36.8 C)  TempSrc: Oral Oral Oral Oral  SpO2: 98% 99% 100% 100%  Weight:   48.3 kg (106 lb 7.7 oz)   Height:   5' (1.524 m)     Intake/Output Summary (Last 24 hours) at 02/18/17 1230 Last data filed at 02/18/17 1000  Gross per 24 hour  Intake           299.17 ml  Output             1200 ml  Net          -900.83 ml   Filed Weights   02/17/17 1302 02/18/17 0432  Weight: 45.4 kg (100 lb) 48.3 kg (106 lb 7.7 oz)    Examination:  General exam: deconditioned and ill looking appearing E ENT: mild pallor, no icterus, oral mucosa dry.  Respiratory system: decreased breath sounds at bases, no wheezing, rales or rhonchi. Respiratory effort normal. Cardiovascular system: S1 & S2 heard, RRR. No JVD, murmurs, rubs, gallops or clicks. Trace pedal edema. Gastrointestinal system: Abdomen is nondistended, soft and nontender. No organomegaly or masses felt. Normal bowel sounds heard. Central nervous system: Awake but disorientated. No focal neurological deficits. Extremities: Symmetric 5 x 5 power. Skin: No rashes, lesions or ulcers     Data Reviewed: I have personally reviewed following labs and imaging studies  CBC:  Recent Labs Lab 02/17/17 1358 02/18/17 0436  WBC 11.2* 9.6  NEUTROABS  --  5.4  HGB 11.8* 10.6*  HCT 34.4* 30.3*  MCV 72.7* 72.7*  PLT 431* 379   Basic Metabolic Panel:  Recent Labs Lab 02/17/17  1358 02/17/17 1843 02/18/17 0055 02/18/17 0436  NA 120* 118* 121* 124*  K 3.9 4.0 3.4* 3.4*  CL 82* 85* 88* 89*  CO2 GLUCOSE 110* 85 78 76  BUN CREATININE 0.71 0.68 0.68 0.70  CALCIUM 9.0 8.3* 8.1* 8.2*  MG  --   --   --  1.3*  PHOS  --   --   --  2.5   GFR: Estimated Creatinine Clearance: 30.9 mL/min (by C-G formula based on SCr of 0.7 mg/dL). Liver Function Tests:  Recent Labs Lab 02/17/17 1358 02/18/17 0436  AST 21 19  ALT 12* 10*  ALKPHOS 47 36*  BILITOT 1.0 0.9  PROT 6.7 5.4*  ALBUMIN 3.7 3.1*   No results  for input(s): LIPASE, AMYLASE in the last 168 hours. No results for input(s): AMMONIA in the last 168 hours. Coagulation Profile: No results for input(s): INR, PROTIME in the last 168 hours. Cardiac Enzymes: No results for input(s): CKTOTAL, CKMB, CKMBINDEX, TROPONINI in the last 168 hours. BNP (last 3 results) No results for input(s): PROBNP in the last 8760 hours. HbA1C: No results for input(s): HGBA1C in the last 72 hours. CBG:  Recent Labs Lab 02/17/17 2115  GLUCAP 88   Lipid Profile:  Recent Labs  02/18/17 0436  CHOL 126  HDL 66  LDLCALC 42  TRIG 91  CHOLHDL 1.9   Thyroid Function Tests: No results for input(s): TSH, T4TOTAL, FREET4, T3FREE, THYROIDAB in the last 72 hours. Anemia Panel: No results for input(s): VITAMINB12, FOLATE, FERRITIN, TIBC, IRON, RETICCTPCT in the last 72 hours. Sepsis Labs: No results for input(s): PROCALCITON, LATICACIDVEN in the last 168 hours.  Recent Results (from the past 240 hour(s))  Urine culture     Status: Abnormal   Collection Time: 02/09/17  7:14 PM  Result Value Ref Range Status   Specimen Description URINE, RANDOM  Final   Special Requests NONE  Final   Culture <10,000 COLONIES/mL INSIGNIFICANT GROWTH (A)  Final   Report Status 02/10/2017 FINAL  Final         Radiology Studies: Dg Chest 2 View  Result Date: 02/17/2017 CLINICAL DATA:  Generalized weakness, slurred speech EXAM: CHEST  2 VIEW COMPARISON:  Chest x-ray and chest CT 02/09/2017 FINDINGS: Cardiac enlargement.  Negative for heart failure. Right lower lobe lung density unchanged. Right lower lobe consolidation and small effusion unchanged. Mild left lower lobe airspace disease. Calcified pleural plaque on the left unchanged. Atherosclerotic aorta. Degenerative change and rotator cuff disease bilaterally in both shoulders. IMPRESSION: Bibasilar airspace disease unchanged. Right lower lobe lung density unchanged. No new findings. Electronically Signed   By: Marlan Palau M.D.   On: 02/17/2017 14:22   Ct Head Wo Contrast  Result Date: 02/17/2017 CLINICAL DATA:  Generalized weakness and slurred speech. EXAM: CT HEAD WITHOUT CONTRAST TECHNIQUE: Contiguous axial images were obtained from the base of the skull through the vertex without intravenous contrast. COMPARISON:  02/09/2017 FINDINGS: Brain: There is no evidence for acute hemorrhage, hydrocephalus, mass lesion, or abnormal extra-axial fluid collection. No definite CT evidence for acute infarction. Diffuse loss of parenchymal volume is consistent with atrophy. Patchy low attenuation in the deep hemispheric and periventricular white matter is nonspecific, but likely reflects chronic microvascular ischemic demyelination. Vascular: Atherosclerotic calcification is visualized in the intracranial segments of the internal carotid arteries. No dense MCA sign. Major dural sinuses are unremarkable. Skull: No evidence for fracture. No worrisome lytic or sclerotic  lesion. Sinuses/Orbits: The visualized paranasal sinuses and mastoid air cells are clear. Visualized portions of the globes and intraorbital fat are unremarkable. Other: None. IMPRESSION: 1. Stable.  No acute intracranial abnormality. 2. Atrophy with chronic small vessel white matter ischemic disease. Electronically Signed   By: Kennith Center M.D.   On: 02/17/2017 14:49   Mr Brain Wo Contrast  Result Date: 02/18/2017 CLINICAL DATA:  Slurred speech EXAM: MRI HEAD WITHOUT CONTRAST MRA HEAD WITHOUT CONTRAST TECHNIQUE: Multiplanar, multiecho pulse sequences of the brain and surrounding structures were obtained without intravenous contrast. Angiographic images of the head were obtained using MRA technique without contrast. COMPARISON:  CT head 02/17/2017 FINDINGS: MRI HEAD FINDINGS Image quality degraded by moderate motion. Brain: Negative for acute infarct. Moderate chronic microvascular ischemic change in the cerebral white matter. Moderate cerebral atrophy. Negative for  hydrocephalus. Negative for hemorrhage, fluid collection, mass, or edema. Vascular: Normal arterial flow voids. Skull and upper cervical spine: No destructive skeletal lesion. Moderate to advanced cervical spondylosis diffusely with kyphosis and spinal stenosis. Sinuses/Orbits: Negative Other: None MRA HEAD FINDINGS Image quality degraded by moderate motion. Distal right vertebral artery widely patent to the basilar. Right PICA patent. Left vertebral ends in PICA. Mild stenosis of the basilar. Posterior cerebral arteries patent bilaterally Internal carotid artery patent bilaterally with mild irregularity in the cavernous segment. Anterior and middle cerebral arteries patent bilaterally. M2 and M3 branches not well evaluated due to motion. IMPRESSION: Negative for acute infarct Moderate atrophy and moderate chronic microvascular ischemia MRA degraded by significant motion.  No large vessel occlusion. Electronically Signed   By: Marlan Palau M.D.   On: 02/18/2017 07:09   Mr Maxine Glenn Head/brain ZO Cm  Result Date: 02/18/2017 CLINICAL DATA:  Slurred speech EXAM: MRI HEAD WITHOUT CONTRAST MRA HEAD WITHOUT CONTRAST TECHNIQUE: Multiplanar, multiecho pulse sequences of the brain and surrounding structures were obtained without intravenous contrast. Angiographic images of the head were obtained using MRA technique without contrast. COMPARISON:  CT head 02/17/2017 FINDINGS: MRI HEAD FINDINGS Image quality degraded by moderate motion. Brain: Negative for acute infarct. Moderate chronic microvascular ischemic change in the cerebral white matter. Moderate cerebral atrophy. Negative for hydrocephalus. Negative for hemorrhage, fluid collection, mass, or edema. Vascular: Normal arterial flow voids. Skull and upper cervical spine: No destructive skeletal lesion. Moderate to advanced cervical spondylosis diffusely with kyphosis and spinal stenosis. Sinuses/Orbits: Negative Other: None MRA HEAD FINDINGS Image quality degraded by  moderate motion. Distal right vertebral artery widely patent to the basilar. Right PICA patent. Left vertebral ends in PICA. Mild stenosis of the basilar. Posterior cerebral arteries patent bilaterally Internal carotid artery patent bilaterally with mild irregularity in the cavernous segment. Anterior and middle cerebral arteries patent bilaterally. M2 and M3 branches not well evaluated due to motion. IMPRESSION: Negative for acute infarct Moderate atrophy and moderate chronic microvascular ischemia MRA degraded by significant motion.  No large vessel occlusion. Electronically Signed   By: Marlan Palau M.D.   On: 02/18/2017 07:09        Scheduled Meds: . calcium-vitamin D  1 tablet Oral Daily  . carvedilol  3.125 mg Oral BID WC  . enoxaparin (LOVENOX) injection  30 mg Subcutaneous Q24H  . hydrALAZINE  75 mg Oral TID  . iron polysaccharides  150 mg Oral Daily  . loratadine  10 mg Oral Daily  . pravastatin  40 mg Oral Daily   Continuous Infusions:   LOS: 0 days       Mauricio Annett Gula, MD [facility] Pager [phone]  If 7PM-7AM, please contact night-coverage [email or web address] Password [email or web address] 02/18/2017, 12:30 PM

## 2017-02-18 NOTE — Progress Notes (Signed)
  Echocardiogram 2D Echocardiogram has been performed.  Nolon Rod 02/18/2017, 9:20 AM

## 2017-02-18 NOTE — Progress Notes (Signed)
SLP Cancellation Note  Patient Details Name: [name] Fry MRN: [id number] DOB: 1920-06-20   Cancelled treatment:       Reason Eval/Treat Not Completed: SLP screened, no needs identified, will sign off. MRI is negative and per chart pts speech is back to baseline. No SLP needs at this time. Will sign off.    Lanitra Battaglini, Riley Nearing 02/18/2017, 2:57 PM

## 2017-02-18 NOTE — Evaluation (Signed)
Physical Therapy Evaluation Patient Details Name: [name] Fry MRN: [id number] DOB: 08-27-1920 Today's Date: 02/18/2017   History of Present Illness  83 yo female admitted with slurred speech. Hx of TIA, CVA, osteoporosis, R hip THA-post 2015, CHF,   Clinical Impression  On eval, pt required Min assist for mobility. She walked ~75 feet with a RW. Noted some confusion during session. Family present during session. Discussed d/c plan-pt will return home with family assisting. Recommend HHPT follow up if pt/family are agreeable. At time of eval, they were unsure if they wanted f/u.    Follow Up Recommendations Supervision/Assistance - 24 hour;Home health PT (if pt/family are agreeable-they may decline it)    Equipment Recommendations  None recommended by PT    Recommendations for Other Services       Precautions / Restrictions Precautions Precautions: Fall Restrictions Weight Bearing Restrictions: No      Mobility  Bed Mobility Overal bed mobility: Needs Assistance Bed Mobility: Supine to Sit     Supine to sit: Min guard;HOB elevated     General bed mobility comments: close guard for safety. Increased time. Cues required.   Transfers Overall transfer level: Needs assistance Equipment used: Rolling walker (2 wheeled) Transfers: Sit to/from [employer] Sit to Stand: Min assist Stand pivot transfers: Min assist       General transfer comment: Assist to rise, stabilize, control descent. VCs safety, hand placement. Stand pivot, bed>bsc, with RW. Increased time.   Ambulation/Gait Ambulation/Gait assistance: Min assist Ambulation Distance (Feet): 75 Feet Assistive device: Rolling walker (2 wheeled) Gait Pattern/deviations: Step-through pattern;Decreased stride length     General Gait Details: Assist to stabilize and maneuver with walker intermittently. Pt tends to keep RW too far ahead  Stairs            Wheelchair Mobility    Modified Rankin  (Stroke Patients Only)       Balance Overall balance assessment: Needs assistance;History of Falls         Standing balance support: Bilateral upper extremity supported Standing balance-Leahy Scale: Poor Standing balance comment: requires RW                             Pertinent Vitals/Pain Pain Assessment: No/denies pain    Home Living Family/patient expects to be discharged to:: Private residence Living Arrangements: Children;Other relatives Available Help at Discharge: Family;Available 24 hours/day Type of Home: House Home Access: Stairs to enter Entrance Stairs-Rails: None Entrance Stairs-Number of Steps: 2 Home Layout: One level Home Equipment: Walker - 2 wheels;Bedside commode      Prior Function Level of Independence: Needs assistance   Gait / Transfers Assistance Needed: Uses RW for community ambulation and independent for household ambulation.  ADL's / Homemaking Assistance Needed: Family does all IADLs and drives.         Hand Dominance        Extremity/Trunk Assessment   Upper Extremity Assessment Upper Extremity Assessment: Generalized weakness    Lower Extremity Assessment Lower Extremity Assessment: Generalized weakness    Cervical / Trunk Assessment Cervical / Trunk Assessment: Kyphotic  Communication   Communication: No difficulties  Cognition Arousal/Alertness: Awake/alert Behavior During Therapy: WFL for tasks assessed/performed Overall Cognitive Status: Impaired/Different from baseline Area of Impairment: Orientation                 Orientation Level: Disoriented to;Place;Time;Situation  General Comments      Exercises     Assessment/Plan    PT Assessment Patient needs continued PT services  PT Problem List Decreased strength;Decreased mobility;Decreased activity tolerance;Decreased balance;Decreased knowledge of use of DME;Decreased cognition       PT Treatment Interventions  DME instruction;Therapeutic activities;Gait training;Therapeutic exercise;Patient/family education;Functional mobility training    PT Goals (Current goals can be found in the Care Plan section)  Acute Rehab PT Goals Patient Stated Goal: home PT Goal Formulation: With patient/family Time For Goal Achievement: 03/04/17 Potential to Achieve Goals: Good    Frequency Min 3X/week   Barriers to discharge        Co-evaluation               End of Session Equipment Utilized During Treatment: Gait belt Activity Tolerance: Patient tolerated treatment well Patient left: in chair;with call bell/phone within reach;with family/visitor present;with chair alarm set   PT Visit Diagnosis: Muscle weakness (generalized) (M62.81);Difficulty in walking, not elsewhere classified (R26.2)    Time: 1610-9604 PT Time Calculation (min) (ACUTE ONLY): 26 min   Charges:   PT Evaluation $PT Eval Low Complexity: 1 Procedure PT Treatments $Gait Training: 8-22 mins   PT G Codes:   PT G-Codes **NOT FOR INPATIENT CLASS** Functional Assessment Tool Used: AM-PAC 6 Clicks Basic Mobility;Clinical judgement      Rebeca Alert, MPT Pager: [phone]

## 2017-02-19 LAB — HEMOGLOBIN A1C
HEMOGLOBIN A1C: 5.5 % (ref 4.8–5.6)
MEAN PLASMA GLUCOSE: 111 mg/dL

## 2017-02-19 LAB — BASIC METABOLIC PANEL
Anion gap: 7 (ref 5–15)
BUN: 7 mg/dL (ref 6–20)
CO2: 25 mmol/L (ref 22–32)
CREATININE: 0.63 mg/dL (ref 0.44–1.00)
Calcium: 8.3 mg/dL — ABNORMAL LOW (ref 8.9–10.3)
Chloride: 93 mmol/L — ABNORMAL LOW (ref 101–111)
GFR calc Af Amer: >60 mL/min (ref >60)
GLUCOSE: 90 mg/dL (ref 65–99)
POTASSIUM: 3.8 mmol/L (ref 3.5–5.1)
SODIUM: 125 mmol/L — AB (ref 135–145)

## 2017-02-19 MED ORDER — FUROSEMIDE 20 MG PO TABS
20.0000 mg | ORAL_TABLET | Freq: Every day | ORAL | Status: DC
  Administered 2017-02-19 – 2017-02-21 (×3): 20 mg via ORAL
  Filled 2017-02-19 (×3): qty 1

## 2017-02-19 NOTE — Progress Notes (Signed)
PROGRESS NOTE    [name] Fry  UYQ:[id number] DOB: 07-01-1920 DOA: 02/17/2017 PCP: Wilmer Floor., MD     Brief Narrative:  83 yo female who presented with the chief complain of slurred speech and generalized weakness. Recent hospitalization for pneumonia. Acute worsening and functional status. Blood pressure 160 systolic. Trace lower extremity edema. Admitted to rule out ischemic event, complicated with hyponatremia.    Assessment & Plan:   Principal Problem:   Slurred speech Active Problems:   HTN (hypertension)   HLD (hyperlipidemia)   Leukocytosis   Acute hyponatremia   Generalized weakness   Encephalopathy   1. Symptomatic hypoNatremia. Stable Na at 125 from 126, will hold on saline IV, continue regular diet, resume furosemide. Will add fluid restriction 1000 ml per 24 hours.   2. Metabolic encephalopathy. Patient more reactive today. Physical therapy evaluation, patient will need 24 home assistance.   3. Diastolic heart failure. Continue coreg, will hold on IV fluids. Resume furosemide.   4, HTN. Continue blood pressure control with coreg and hydralazine.   5. Dyslipidemia. Continue pravastatin.     DVT prophylaxis: enoxaparin  Code Status: dnr  Family Communication: no family at the bedside  Disposition Plan: Home with home health  Consultants:     Procedures:   Antimicrobials:     Subjective: Patient with no chest pain, no nausea or vomiting, no chest pain or dyspnea.   Objective: Vitals:   02/18/17 1434 02/18/17 1639 02/18/17 2126 02/19/17 0521  BP: (!) 149/75 (!) 145/83 123/65 136/67  Pulse: 82 71 70 71  Resp: Temp: 97.8 F (36.6 C)  98.6 F (37 C) 97.6 F (36.4 C)  TempSrc: Oral  Oral Oral  SpO2: 100% 100% 100% 100%  Weight:    47.3 kg (104 lb 4.4 oz)  Height:        Intake/Output Summary (Last 24 hours) at 02/19/17 1230 Last data filed at 02/19/17 1149  Gross per 24 hour  Intake          1078.33 ml    Output              200 ml  Net           878.33 ml   Filed Weights   02/17/17 1302 02/18/17 0432 02/19/17 0521  Weight: 45.4 kg (100 lb) 48.3 kg (106 lb 7.7 oz) 47.3 kg (104 lb 4.4 oz)    Examination:  General exam: deconditioned E ENT: mild pallor, oral mucosa moist.  Respiratory system: Clear to auscultation. Respiratory effort normal. No wheezing, rales or rhonchi. Cardiovascular system: S1 & S2 heard, RRR. No JVD, murmurs, rubs, gallops or clicks. Trace pitting pedal edema. Gastrointestinal system: Abdomen is nondistended, soft and nontender. No organomegaly or masses felt. Normal bowel sounds heard. Central nervous system: Alert and oriented. No focal neurological deficits. Extremities: Symmetric 5 x 5 power. Skin: No rashes, lesions or ulcers .     Data Reviewed: I have personally reviewed following labs and imaging studies  CBC:  Recent Labs Lab 02/17/17 1358 02/18/17 0436  WBC 11.2* 9.6  NEUTROABS  --  5.4  HGB 11.8* 10.6*  HCT 34.4* 30.3*  MCV 72.7* 72.7*  PLT 431* 379   Basic Metabolic Panel:  Recent Labs Lab 02/17/17 1843 02/18/17 0055 02/18/17 0436 02/18/17 1410 02/19/17 0542  NA 118* 121* 124* 126* 125*  K 4.0 3.4* 3.4* 3.7 3.8  CL 85* 88* 89* 90* 93*  CO2 25 25 26  28 25  GLUCOSE 85 78 76 131* 90  BUN CREATININE 0.68 0.68 0.70 0.84 0.63  CALCIUM 8.3* 8.1* 8.2* 8.3* 8.3*  MG  --   --  1.3*  --   --   PHOS  --   --  2.5  --   --    GFR: Estimated Creatinine Clearance: 30.9 mL/min (by C-G formula based on SCr of 0.63 mg/dL). Liver Function Tests:  Recent Labs Lab 02/17/17 1358 02/18/17 0436  AST 21 19  ALT 12* 10*  ALKPHOS 47 36*  BILITOT 1.0 0.9  PROT 6.7 5.4*  ALBUMIN 3.7 3.1*   No results for input(s): LIPASE, AMYLASE in the last 168 hours. No results for input(s): AMMONIA in the last 168 hours. Coagulation Profile: No results for input(s): INR, PROTIME in the last 168 hours. Cardiac Enzymes: No results for  input(s): CKTOTAL, CKMB, CKMBINDEX, TROPONINI in the last 168 hours. BNP (last 3 results) No results for input(s): PROBNP in the last 8760 hours. HbA1C:  Recent Labs  02/18/17 0436  HGBA1C 5.5   CBG:  Recent Labs Lab 02/17/17 2115  GLUCAP 88   Lipid Profile:  Recent Labs  02/18/17 0436  CHOL 126  HDL 66  LDLCALC 42  TRIG 91  CHOLHDL 1.9   Thyroid Function Tests: No results for input(s): TSH, T4TOTAL, FREET4, T3FREE, THYROIDAB in the last 72 hours. Anemia Panel: No results for input(s): VITAMINB12, FOLATE, FERRITIN, TIBC, IRON, RETICCTPCT in the last 72 hours. Sepsis Labs: No results for input(s): PROCALCITON, LATICACIDVEN in the last 168 hours.  Recent Results (from the past 240 hour(s))  Urine culture     Status: Abnormal   Collection Time: 02/09/17  7:14 PM  Result Value Ref Range Status   Specimen Description URINE, RANDOM  Final   Special Requests NONE  Final   Culture <10,000 COLONIES/mL INSIGNIFICANT GROWTH (A)  Final   Report Status 02/10/2017 FINAL  Final         Radiology Studies: Dg Chest 2 View  Result Date: 02/17/2017 CLINICAL DATA:  Generalized weakness, slurred speech EXAM: CHEST  2 VIEW COMPARISON:  Chest x-ray and chest CT 02/09/2017 FINDINGS: Cardiac enlargement.  Negative for heart failure. Right lower lobe lung density unchanged. Right lower lobe consolidation and small effusion unchanged. Mild left lower lobe airspace disease. Calcified pleural plaque on the left unchanged. Atherosclerotic aorta. Degenerative change and rotator cuff disease bilaterally in both shoulders. IMPRESSION: Bibasilar airspace disease unchanged. Right lower lobe lung density unchanged. No new findings. Electronically Signed   By: Marlan Palau M.D.   On: 02/17/2017 14:22   Ct Head Wo Contrast  Result Date: 02/17/2017 CLINICAL DATA:  Generalized weakness and slurred speech. EXAM: CT HEAD WITHOUT CONTRAST TECHNIQUE: Contiguous axial images were obtained from the base  of the skull through the vertex without intravenous contrast. COMPARISON:  02/09/2017 FINDINGS: Brain: There is no evidence for acute hemorrhage, hydrocephalus, mass lesion, or abnormal extra-axial fluid collection. No definite CT evidence for acute infarction. Diffuse loss of parenchymal volume is consistent with atrophy. Patchy low attenuation in the deep hemispheric and periventricular white matter is nonspecific, but likely reflects chronic microvascular ischemic demyelination. Vascular: Atherosclerotic calcification is visualized in the intracranial segments of the internal carotid arteries. No dense MCA sign. Major dural sinuses are unremarkable. Skull: No evidence for fracture. No worrisome lytic or sclerotic lesion. Sinuses/Orbits: The visualized paranasal sinuses and mastoid air cells are clear. Visualized portions of the globes and  intraorbital fat are unremarkable. Other: None. IMPRESSION: 1. Stable.  No acute intracranial abnormality. 2. Atrophy with chronic small vessel white matter ischemic disease. Electronically Signed   By: Kennith Center M.D.   On: 02/17/2017 14:49   Mr Brain Wo Contrast  Result Date: 02/18/2017 CLINICAL DATA:  Slurred speech EXAM: MRI HEAD WITHOUT CONTRAST MRA HEAD WITHOUT CONTRAST TECHNIQUE: Multiplanar, multiecho pulse sequences of the brain and surrounding structures were obtained without intravenous contrast. Angiographic images of the head were obtained using MRA technique without contrast. COMPARISON:  CT head 02/17/2017 FINDINGS: MRI HEAD FINDINGS Image quality degraded by moderate motion. Brain: Negative for acute infarct. Moderate chronic microvascular ischemic change in the cerebral white matter. Moderate cerebral atrophy. Negative for hydrocephalus. Negative for hemorrhage, fluid collection, mass, or edema. Vascular: Normal arterial flow voids. Skull and upper cervical spine: No destructive skeletal lesion. Moderate to advanced cervical spondylosis diffusely with  kyphosis and spinal stenosis. Sinuses/Orbits: Negative Other: None MRA HEAD FINDINGS Image quality degraded by moderate motion. Distal right vertebral artery widely patent to the basilar. Right PICA patent. Left vertebral ends in PICA. Mild stenosis of the basilar. Posterior cerebral arteries patent bilaterally Internal carotid artery patent bilaterally with mild irregularity in the cavernous segment. Anterior and middle cerebral arteries patent bilaterally. M2 and M3 branches not well evaluated due to motion. IMPRESSION: Negative for acute infarct Moderate atrophy and moderate chronic microvascular ischemia MRA degraded by significant motion.  No large vessel occlusion. Electronically Signed   By: Marlan Palau M.D.   On: 02/18/2017 07:09   Mr Maxine Glenn Head/brain ZO Cm  Result Date: 02/18/2017 CLINICAL DATA:  Slurred speech EXAM: MRI HEAD WITHOUT CONTRAST MRA HEAD WITHOUT CONTRAST TECHNIQUE: Multiplanar, multiecho pulse sequences of the brain and surrounding structures were obtained without intravenous contrast. Angiographic images of the head were obtained using MRA technique without contrast. COMPARISON:  CT head 02/17/2017 FINDINGS: MRI HEAD FINDINGS Image quality degraded by moderate motion. Brain: Negative for acute infarct. Moderate chronic microvascular ischemic change in the cerebral white matter. Moderate cerebral atrophy. Negative for hydrocephalus. Negative for hemorrhage, fluid collection, mass, or edema. Vascular: Normal arterial flow voids. Skull and upper cervical spine: No destructive skeletal lesion. Moderate to advanced cervical spondylosis diffusely with kyphosis and spinal stenosis. Sinuses/Orbits: Negative Other: None MRA HEAD FINDINGS Image quality degraded by moderate motion. Distal right vertebral artery widely patent to the basilar. Right PICA patent. Left vertebral ends in PICA. Mild stenosis of the basilar. Posterior cerebral arteries patent bilaterally Internal carotid artery patent  bilaterally with mild irregularity in the cavernous segment. Anterior and middle cerebral arteries patent bilaterally. M2 and M3 branches not well evaluated due to motion. IMPRESSION: Negative for acute infarct Moderate atrophy and moderate chronic microvascular ischemia MRA degraded by significant motion.  No large vessel occlusion. Electronically Signed   By: Marlan Palau M.D.   On: 02/18/2017 07:09        Scheduled Meds: . calcium-vitamin D  1 tablet Oral Daily  . carvedilol  3.125 mg Oral BID WC  . enoxaparin (LOVENOX) injection  30 mg Subcutaneous Q24H  . hydrALAZINE  75 mg Oral TID  . iron polysaccharides  150 mg Oral Daily  . loratadine  10 mg Oral Daily  . pravastatin  40 mg Oral Daily   Continuous Infusions:   LOS: 1 day      Sidi Dzikowski Annett Gula, MD [facility] Pager [phone]  If 7PM-7AM, please contact night-coverage [email or web address] Password [email or web address] 02/19/2017, 12:30 PM

## 2017-02-19 NOTE — Progress Notes (Signed)
Returned call to pt's family ([name] Fry).  Attempted to return call to [facility] but was unable to reach. Maeola Harman

## 2017-02-20 LAB — CBC
HEMATOCRIT: 33 % — AB (ref 36.0–46.0)
HEMATOCRIT: 31.3 % — AB (ref 36.0–46.0)
HEMOGLOBIN: 11.2 g/dL — AB (ref 12.0–15.0)
HEMOGLOBIN: 10.5 g/dL — AB (ref 12.0–15.0)
MCH: 24.8 pg — ABNORMAL LOW (ref 26.0–34.0)
MCH: 24.2 pg — ABNORMAL LOW (ref 26.0–34.0)
MCHC: 33.9 g/dL (ref 30.0–36.0)
MCHC: 33.5 g/dL (ref 30.0–36.0)
MCV: 73.2 fL — AB (ref 78.0–100.0)
MCV: 72.3 fL — AB (ref 78.0–100.0)
Platelets: 477 10*3/uL — ABNORMAL HIGH (ref 150–400)
Platelets: 421 10*3/uL — ABNORMAL HIGH (ref 150–400)
RBC: 4.51 MIL/uL (ref 3.87–5.11)
RBC: 4.33 MIL/uL (ref 3.87–5.11)
RDW: 17.2 % — ABNORMAL HIGH (ref 11.5–15.5)
RDW: 17.3 % — AB (ref 11.5–15.5)
WBC: 13.3 10*3/uL — AB (ref 4.0–10.5)
WBC: 11.6 10*3/uL — ABNORMAL HIGH (ref 4.0–10.5)

## 2017-02-20 LAB — BASIC METABOLIC PANEL
ANION GAP: 9 (ref 5–15)
BUN: 6 mg/dL (ref 6–20)
CHLORIDE: 90 mmol/L — AB (ref 101–111)
CO2: 26 mmol/L (ref 22–32)
Calcium: 8.8 mg/dL — ABNORMAL LOW (ref 8.9–10.3)
Creatinine, Ser: 0.65 mg/dL (ref 0.44–1.00)
GFR calc Af Amer: >60 mL/min (ref >60)
GLUCOSE: 124 mg/dL — AB (ref 65–99)
POTASSIUM: 3.7 mmol/L (ref 3.5–5.1)
Sodium: 125 mmol/L — ABNORMAL LOW (ref 135–145)

## 2017-02-20 LAB — ABO/RH: ABO/RH(D): A POS

## 2017-02-20 MED ORDER — SODIUM CHLORIDE 0.9 % IV SOLN
80.0000 mg | Freq: Once | INTRAVENOUS | Status: AC
  Administered 2017-02-20: 04:00:00 80 mg via INTRAVENOUS
  Filled 2017-02-20: qty 80

## 2017-02-20 MED ORDER — PANTOPRAZOLE SODIUM 40 MG PO TBEC
40.0000 mg | DELAYED_RELEASE_TABLET | Freq: Every day | ORAL | Status: DC
  Administered 2017-02-20 – 2017-02-21 (×2): 40 mg via ORAL
  Filled 2017-02-20 (×2): qty 1

## 2017-02-20 MED ORDER — SODIUM CHLORIDE 0.9 % IV SOLN
8.0000 mg/h | INTRAVENOUS | Status: DC
  Administered 2017-02-20: 8 mg/h via INTRAVENOUS
  Filled 2017-02-20 (×2): qty 80

## 2017-02-20 MED ORDER — SODIUM CHLORIDE 0.9 % IV SOLN
INTRAVENOUS | Status: DC
Start: 2017-02-20 — End: 2017-02-20

## 2017-02-20 NOTE — Progress Notes (Signed)
Pt vomited brown coffee-ground like substance. Strong metallic smell. Pixie Casino, MD made aware. Will continue to monitor the pt closely. Mardene Celeste I

## 2017-02-20 NOTE — Progress Notes (Signed)
PROGRESS NOTE    [name] Fry  WGN:[id number] DOB: [birth year] DOA: 02/17/2017 PCP: Wilmer Floor., MD    Brief Narrative:  83 yo female who presented with the chief complain of slurred speech and generalized weakness. Recent hospitalization for pneumonia. Acute worsening and functional status. Blood pressure 160 systolic. Trace lower extremity edema. Admitted to rule out ischemic event, complicated with hyponatremia.   Assessment & Plan:   Principal Problem:   Slurred speech Active Problems:   HTN (hypertension)   HLD (hyperlipidemia)   Leukocytosis   Acute hyponatremia   Generalized weakness   Encephalopathy   1. Symptomatic hypoNatremia. Stable Na at 125 on liberal regular diet, resume furosemide. Will add fluid restriction 1000 ml per 24 hours. Will follow on urine electrolytes, note patient is on furosemide. 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   2. Metabolic encephalopathy. Patient more reactive and awake. Will continue neuro checks per unit protocol.   3. Diastolic heart failure. On coreg and furosemide, hold on ace inh for now.   4, HTN. Continue coreg and hydralazine, systolic blood pressure 140 to 170,   5. Dyslipidemia. Continue pravastatin per home regimen  6. New Coffee ground emesis. Last night episode of coffee ground emesis, no significant change in hb and hct, no signs of active bleeding, will continue antiacid therapy with pantoprazole and will advance diet. Continue conservative care for now. Avoid asa.   DVT prophylaxis:enoxaparin  Code Status:dnr  Family Communication: no family at the bedside  Disposition Plan:Home with home health  Consultants:    Procedures:  Antimicrobials:    Consultants:     Procedures:  Antimicrobials:   Subjective: Patient had episode of brown coffee-ground emesis. Started on pantoprazole. This am no further nausea or vomiting. No abdominal pain, no dyspnea.   Objective: Vitals:   02/19/17 0521 02/19/17 1300 02/19/17 2044 02/20/17 0608  BP: 136/67 (!) 154/77 (!) 144/71 (!) 172/83  Pulse: 71 62 69 88  Resp: Temp: 97.6 F (36.4 C) 97.7 F (36.5 C) 97.9 F (36.6 C) 97.7 F (36.5 C)  TempSrc: Oral Oral Oral   SpO2: 100% 100% 100% 100%  Weight: 47.3 kg (104 lb 4.4 oz)   45.1 kg (99 lb 6.8 oz)  Height:        Intake/Output Summary (Last 24 hours) at 02/20/17 1326 Last data filed at 02/20/17 0600  Gross per 24 hour  Intake            56.25 ml  Output                0 ml  Net            56.25 ml   Filed Weights   02/18/17 0432 02/19/17 0521 02/20/17 7846  Weight: 48.3 kg (106 lb 7.7 oz) 47.3 kg (104 lb 4.4 oz) 45.1 kg (99 lb 6.8 oz)    Examination:  General exam: deconditioned E ENT: no pallor or icterus Respiratory system: Clear to auscultation. Respiratory effort normal. Mild decrease breath sounds at bases, poor inspiratory effort.  Cardiovascular system: S1 & S2 heard, RRR. No JVD, murmurs, rubs, gallops or clicks. No pedal edema. Gastrointestinal system: Abdomen is nondistended, soft and nontender. No organomegaly or masses felt. Normal bowel sounds heard. Central nervous system: Alert and oriented. No focal neurological deficits. Extremities: Symmetric 5 x 5 power. Skin: No rashes, lesions or ulcers     Data Reviewed: I have personally reviewed following labs and imaging studies  CBC:  Recent Labs Lab 02/17/17 1358 02/18/17 0436 02/20/17 0355 02/20/17 0806  WBC 11.2* 9.6 13.3* 11.6*  NEUTROABS  --  5.4  --   --   HGB 11.8* 10.6* 11.2* 10.5*  HCT 34.4* 30.3* 33.0* 31.3*  MCV 72.7* 72.7* 73.2* 72.3*  PLT 431* 379 477* 421*   Basic Metabolic Panel:  Recent Labs Lab 02/18/17 0055 02/18/17 0436 02/18/17 1410 02/19/17 0542 02/20/17 0355  NA 121* 124* 126* 125* 125*  K 3.4* 3.4* 3.7 3.8 3.7  CL 88* 89* 90* 93* 90*  CO2 GLUCOSE 78 76 131* 90 124*  BUN CREATININE 0.68 0.70 0.84 0.63 0.65    CALCIUM 8.1* 8.2* 8.3* 8.3* 8.8*  MG  --  1.3*  --   --   --   PHOS  --  2.5  --   --   --    GFR: Estimated Creatinine Clearance: 30.6 mL/min (by C-G formula based on SCr of 0.65 mg/dL). Liver Function Tests:  Recent Labs Lab 02/17/17 1358 02/18/17 0436  AST 21 19  ALT 12* 10*  ALKPHOS 47 36*  BILITOT 1.0 0.9  PROT 6.7 5.4*  ALBUMIN 3.7 3.1*   No results for input(s): LIPASE, AMYLASE in the last 168 hours. No results for input(s): AMMONIA in the last 168 hours. Coagulation Profile: No results for input(s): INR, PROTIME in the last 168 hours. Cardiac Enzymes: No results for input(s): CKTOTAL, CKMB, CKMBINDEX, TROPONINI in the last 168 hours. BNP (last 3 results) No results for input(s): PROBNP in the last 8760 hours. HbA1C:  Recent Labs  02/18/17 0436  HGBA1C 5.5   CBG:  Recent Labs Lab 02/17/17 2115  GLUCAP 88   Lipid Profile:  Recent Labs  02/18/17 0436  CHOL 126  HDL 66  LDLCALC 42  TRIG 91  CHOLHDL 1.9   Thyroid Function Tests: No results for input(s): TSH, T4TOTAL, FREET4, T3FREE, THYROIDAB in the last 72 hours. Anemia Panel: No results for input(s): VITAMINB12, FOLATE, FERRITIN, TIBC, IRON, RETICCTPCT in the last 72 hours. Sepsis Labs: No results for input(s): PROCALCITON, LATICACIDVEN in the last 168 hours.  No results found for this or any previous visit (from the past 240 hour(s)).       Radiology Studies: No results found.      Scheduled Meds: . calcium-vitamin D  1 tablet Oral Daily  . carvedilol  3.125 mg Oral BID WC  . furosemide  20 mg Oral Daily  . hydrALAZINE  75 mg Oral TID  . iron polysaccharides  150 mg Oral Daily  . loratadine  10 mg Oral Daily  . pantoprazole  40 mg Oral Daily  . pravastatin  40 mg Oral Daily   Continuous Infusions:   LOS: 2 days     Mauricio Annett Gula, MD [facility] Pager [phone]  If 7PM-7AM, please contact night-coverage [email or web address] Password [email or web address] 02/20/2017, 1:26  PM

## 2017-02-21 DIAGNOSIS — K2900 Acute gastritis without bleeding: Secondary | ICD-10-CM

## 2017-02-21 DIAGNOSIS — I5023 Acute on chronic systolic (congestive) heart failure: Secondary | ICD-10-CM

## 2017-02-21 LAB — CBC WITH DIFFERENTIAL/PLATELET
BASOS ABS: 0 10*3/uL (ref 0.0–0.1)
BASOS PCT: 0 %
EOS ABS: 0.1 10*3/uL (ref 0.0–0.7)
Eosinophils Relative: 1 %
HEMATOCRIT: 30.3 % — AB (ref 36.0–46.0)
HEMOGLOBIN: 10.2 g/dL — AB (ref 12.0–15.0)
LYMPHS ABS: 2.5 10*3/uL (ref 0.7–4.0)
LYMPHS PCT: 24 %
MCH: 24.8 pg — ABNORMAL LOW (ref 26.0–34.0)
MCHC: 33.7 g/dL (ref 30.0–36.0)
MCV: 73.7 fL — ABNORMAL LOW (ref 78.0–100.0)
Monocytes Absolute: 1.2 10*3/uL — ABNORMAL HIGH (ref 0.1–1.0)
Monocytes Relative: 11 %
Neutro Abs: 6.7 10*3/uL (ref 1.7–7.7)
Neutrophils Relative %: 64 %
Platelets: 384 10*3/uL (ref 150–400)
RBC: 4.11 MIL/uL (ref 3.87–5.11)
RDW: 17.5 % — ABNORMAL HIGH (ref 11.5–15.5)
WBC: 10.4 10*3/uL (ref 4.0–10.5)

## 2017-02-21 LAB — BASIC METABOLIC PANEL
ANION GAP: 7 (ref 5–15)
BUN: 9 mg/dL (ref 6–20)
CO2: 27 mmol/L (ref 22–32)
CREATININE: 0.84 mg/dL (ref 0.44–1.00)
Calcium: 8.2 mg/dL — ABNORMAL LOW (ref 8.9–10.3)
Chloride: 94 mmol/L — ABNORMAL LOW (ref 101–111)
GFR calc non Af Amer: 58 mL/min — ABNORMAL LOW (ref >60)
Glucose, Bld: 79 mg/dL (ref 65–99)
POTASSIUM: 3.4 mmol/L — AB (ref 3.5–5.1)
SODIUM: 128 mmol/L — AB (ref 135–145)

## 2017-02-21 LAB — TYPE AND SCREEN
ABO/RH(D): A POS
Antibody Screen: NEGATIVE

## 2017-02-21 MED ORDER — PANTOPRAZOLE SODIUM 40 MG PO TBEC: 40.0000 mg | DELAYED_RELEASE_TABLET | Freq: Every day | ORAL | 0 refills | Status: DC

## 2017-02-21 NOTE — Care Management Note (Signed)
Case Management Note  Patient Details  Name: [name] Fry MRN: [id number] Date of Birth: Jan 15, 1920  Subjective/Objective:Patient ordered for HHRN/PT/OT-[facility] rep Selena Batten already aware of d/c & HHC orders. No further CM needs.                    Action/Plan:d/c home w/HHC.   Expected Discharge Date:   (unknown)               Expected Discharge Plan:  Home w Home Health Services  In-House Referral:     Discharge planning Services  CM Consult  Post Acute Care Choice:    Choice offered to:  Adult Children  DME Arranged:    DME Agency:     HH Arranged:  RN, PT, OT HH Agency:  [facility]  Status of Service:  Completed, signed off  If discussed at Long Length of Stay Meetings, dates discussed:    Additional Comments:  Lanier Clam, RN 02/21/2017, 11:09 AM

## 2017-02-21 NOTE — Progress Notes (Signed)
Occupational Therapy Treatment Patient Details Name: [name] Fry MRN: [id number] DOB: 06-Nov-1920 Today's Date: 02/21/2017    History of present illness 83 yo female admitted with slurred speech. Hx of TIA, CVA, osteoporosis, R hip THA-post 2015, and CHF; MRI test results negative.   OT comments  Pt appears weaker today than initial eval. Pt required increased time and verbal cues for completion of all functional mobility and ADL tasks. Discharge plans remain appropriate at this time pending family able to care for Pt at current level of care.    Follow Up Recommendations  Supervision/Assistance - 24 hour;Home health OT (If family unable to care for Pt at current level of care, SNF may be better option )    Equipment Recommendations  None recommended by OT          Precautions / Restrictions Precautions Precautions: Fall  Restrictions Weight Bearing Restrictions: No       Mobility Bed Mobility Overal bed mobility: Needs Assistance Bed Mobility: Sit to Supine     Supine to sit: Min assist;HOB elevated     General bed mobility comments: Increased time to complete supine to sit transfer  Transfers Overall transfer level: Needs assistance Equipment used: Rolling walker (2 wheeled) Transfers: Sit to/from [employer] Sit to Stand: Min guard Stand pivot transfers: Min assist       General transfer comment: for safety; increased time and verbal cues for hand placement required     Balance Overall balance assessment: Needs assistance;History of Falls Sitting-balance support: Feet supported Sitting balance-Leahy Scale: Fair     Standing balance support: Bilateral upper extremity supported Standing balance-Leahy Scale: Poor Standing balance comment: requires RW                           ADL either performed or assessed with clinical judgement   ADL   Eating/Feeding: Independent   Grooming: Wash/dry face;Set up;Sitting                Lower Body Dressing: Maximal assistance; MinGuard Sit to/from stand [employer] fast streamer briefs)   Toilet Transfer: Minimal assistance;Stand-pivot;BSC   Toileting- Clothing Manipulation and Hygiene: Moderate assistance; MinGuard Sit to/from stand         General ADL Comments: Completed short functional mobility EOB to recliner, stand pivot transfer to/from [facility]; verbal cues required throughout for hand placement during sit/stand transfers                       Cognition Arousal/Alertness: Awake/alert Behavior During Therapy: [facility] for tasks assessed/performed                           Following Commands: Follows one step commands with increased time (Increased time, some hand over hand)                                  Pertinent Vitals/ Pain       Pain Assessment: Faces Faces Pain Scale: Hurts little more Pain Location: Head Pain Descriptors / Indicators: Aching Pain Intervention(s): Limited activity within patient's tolerance;Monitored during session;RN gave pain meds during session  Frequency  Min 2X/week        Progress Toward Goals  OT Goals(current goals can now be found in the care plan section)  Progress towards OT goals: Not progressing toward goals - comment (Pt appears weaker than on eval)     Plan Discharge plan remains appropriate                    End of Session Equipment Utilized During Treatment: Gait belt;Rolling walker  OT Visit Diagnosis: Unsteadiness on feet (R26.81);Muscle weakness (generalized) (M62.81)   Activity Tolerance Patient tolerated treatment well   Patient Left in chair;with call bell/phone within reach;with chair alarm set   Nurse Communication Other (comment) ([name] completion of toileting; left in chair with chair alarm)        Time: 5784-6962 OT Time Calculation (min): 41 min  Charges: OT General Charges $OT  Visit: 1 Procedure OT Treatments $Self Care/Home Management : 38-52 mins  Marcy Siren, OT Pager [phone] 02/21/2017  Orlando Penner 02/21/2017, 12:39 PM

## 2017-02-21 NOTE — Discharge Summary (Signed)
Physician Discharge Summary  FARHIYA ROSTEN ZOX:[id number] DOB: November 26, 1919 DOA: 02/17/2017  PCP: Wilmer Floor., MD  Admit date: 02/17/2017 Discharge date: 02/21/2017  Admitted From: Home  Disposition: Home   Recommendations for Outpatient Follow-up:  1. Follow up with PCP in 1- week   Home Health: yes  Equipment/Devices: No   Discharge Condition: stable  CODE STATUS: DNR  Diet recommendation: Heart Healthy   Brief/Interim Summary: This is a 83 year old female who presented to the hospital with the chief complaint slurred speech and generalized weakness. She was noted by her family to have severe weakness, needing assistance for ambulation. On initial physical examination blood pressure 147/83, heart rate 76, respiratory rate 18, oxygen saturation 100%, her oral mucosa was moist, neck was supple, lungs had diminished breath sounds bilaterally but no wheezing, heart S1-S2 present rhythmic, abdomen was soft nontender, lower extremities with 2+ edema. Sodium 120, potassium 3.9, chloride 92, bicarbonate 29, glucose 110, BUN 6, creatinine 0.71, white count 11.2, hemoglobin 11.8, platelets 431. Chest x-ray with bibasilar atelectasis. Urine analysis negative for infection, urine osmolality 275. Head CT negative for acute changes. EKG normal sinus rhythm with a right bundle branch block.  The patient was admitted to hospital working diagnosis of TIA to rule out CVA, complicated by symptomatic hyponatremia.   1. Symptomatic hyponatremia, with metabolic encephalopathy. Patient received IV fluids with no improvement of serum sodium. Fluids were restricted and diuresis was resumed with improvement of serum sodium suggesting  hypervolemic hyponatremia. Will recommend follow-up electrolyte as an outpatient. Further workup with an brain MRI was negative for ischemic events. Patient was seen by physical therapy recommendations for home physical therapy. Continue diuresis. Discharge sodium 128.  2.  Systolic heart failure, acute on chronic. Will continue Coreg, furosemide. clinically more euvolemic. Echocardiogram with ejection fraction 25-30%, diffuse hypokinesis, continue Aldactone. Patient may benefit from ACE inhibitor if blood pressure tolerates.   3. Hypertension. Remained well-controlled. Continue Coreg and hydralazine.  4. Dyslipidemia. Continue pravastatin.  5. Suspected gastritis. Will continue proton pump inhibitors daily.   Discharge Diagnoses:  Principal Problem:   Slurred speech Active Problems:   HTN (hypertension)   HLD (hyperlipidemia)   Leukocytosis   Acute hyponatremia   Generalized weakness   Encephalopathy    Discharge Instructions   Allergies as of 02/21/2017   No Known Allergies     Medication List    STOP taking these medications   cefpodoxime 200 MG tablet Commonly known as:  VANTIN   metroNIDAZOLE 500 MG tablet Commonly known as:  FLAGYL     TAKE these medications   acetaminophen 325 MG tablet Commonly known as:  TYLENOL Take 650 mg by mouth every 6 (six) hours as needed for mild pain or moderate pain.   ANTI ITCH EX Apply 1 application topically as needed (for itching).   calcium-vitamin D 500-200 MG-UNIT tablet Commonly known as:  OSCAL WITH D Take 1 tablet by mouth daily with breakfast. What changed:  when to take this   carvedilol 3.125 MG tablet Commonly known as:  COREG Take 1 tablet (3.125 mg total) by mouth 2 (two) times daily with a meal.   FERREX 150 PO Take 150 mg by mouth daily.   furosemide 20 MG tablet Commonly known as:  LASIX Take 1 tablet (20 mg total) by mouth daily.   hydrALAZINE 50 MG tablet Commonly known as:  APRESOLINE Take 1.5 tablets (75 mg total) by mouth 3 (three) times daily.   loratadine 10 MG tablet Commonly known  as:  CLARITIN Take 10 mg by mouth daily.   pantoprazole 40 MG tablet Commonly known as:  PROTONIX Take 1 tablet (40 mg total) by mouth daily. Start taking on:  02/22/2017    Potassium Chloride ER 20 MEQ Tbcr Take 20 mEq by mouth daily.   pravastatin 40 MG tablet Commonly known as:  PRAVACHOL Take 40 mg by mouth daily.   spironolactone 50 MG tablet Commonly known as:  ALDACTONE Take 50 mg by mouth daily.   STOOL SOFTENER PO Take 1 capsule by mouth as needed (for constipation).      Follow-up Information    CAMPBELL, Mila Homer., MD.   Specialty:  Internal Medicine Contact information: [address] [place] [address] [phone]          No Known Allergies  Consultations:     Procedures/Studies: Dg Chest 2 View  Result Date: 02/17/2017 CLINICAL DATA:  Generalized weakness, slurred speech EXAM: CHEST  2 VIEW COMPARISON:  Chest x-ray and chest CT 02/09/2017 FINDINGS: Cardiac enlargement.  Negative for heart failure. Right lower lobe lung density unchanged. Right lower lobe consolidation and small effusion unchanged. Mild left lower lobe airspace disease. Calcified pleural plaque on the left unchanged. Atherosclerotic aorta. Degenerative change and rotator cuff disease bilaterally in both shoulders. IMPRESSION: Bibasilar airspace disease unchanged. Right lower lobe lung density unchanged. No new findings. Electronically Signed   By: Marlan Palau M.D.   On: 02/17/2017 14:22   Dg Chest 2 View  Result Date: 02/09/2017 CLINICAL DATA:  Confusion and disorientation. EXAM: CHEST  2 VIEW COMPARISON:  10/11/16 FINDINGS: There is moderate cardiac enlargement. Aortic atherosclerosis. There are moderate bilateral pleural effusions identified. Mild interstitial edema. Aortic atherosclerosis noted. IMPRESSION: 1. Moderate bilateral pleural effusions are identified. Electronically Signed   By: Signa Kell M.D.   On: 02/09/2017 18:47   Ct Head Wo Contrast  Result Date: 02/17/2017 CLINICAL DATA:  Generalized weakness and slurred speech. EXAM: CT HEAD WITHOUT CONTRAST TECHNIQUE: Contiguous axial images were obtained from the base of the  skull through the vertex without intravenous contrast. COMPARISON:  02/09/2017 FINDINGS: Brain: There is no evidence for acute hemorrhage, hydrocephalus, mass lesion, or abnormal extra-axial fluid collection. No definite CT evidence for acute infarction. Diffuse loss of parenchymal volume is consistent with atrophy. Patchy low attenuation in the deep hemispheric and periventricular white matter is nonspecific, but likely reflects chronic microvascular ischemic demyelination. Vascular: Atherosclerotic calcification is visualized in the intracranial segments of the internal carotid arteries. No dense MCA sign. Major dural sinuses are unremarkable. Skull: No evidence for fracture. No worrisome lytic or sclerotic lesion. Sinuses/Orbits: The visualized paranasal sinuses and mastoid air cells are clear. Visualized portions of the globes and intraorbital fat are unremarkable. Other: None. IMPRESSION: 1. Stable.  No acute intracranial abnormality. 2. Atrophy with chronic small vessel white matter ischemic disease. Electronically Signed   By: Kennith Center M.D.   On: 02/17/2017 14:49   Ct Head Wo Contrast  Result Date: 02/09/2017 CLINICAL DATA:  Confusion for 2 days EXAM: CT HEAD WITHOUT CONTRAST TECHNIQUE: Contiguous axial images were obtained from the base of the skull through the vertex without intravenous contrast. COMPARISON:  10/08/2016 FINDINGS: Brain: No intracranial hemorrhage, mass effect or midline shift. Stable atrophy. Again noted periventricular and patchy subcortical white matter decreased attenuation consistent with chronic small vessel ischemic changes. No acute cortical infarction. No mass lesion is noted on this unenhanced scan. Vascular: Atherosclerotic calcifications bilateral vertebral arteries. Atherosclerotic calcifications of carotid  siphon. Skull: No skull fracture. Sinuses/Orbits: No acute findings. Other: None IMPRESSION: No acute intracranial abnormality. Stable atrophy and chronic white  matter disease. No definite acute cortical infarction. Electronically Signed   By: Natasha Mead M.D.   On: 02/09/2017 19:23   Ct Chest W Contrast  Result Date: 02/09/2017 CLINICAL DATA:  Persistent bilateral pleural effusions noted on chest radiograph. Recurrent hyponatremia. Assess for underlying malignancy. Initial encounter. EXAM: CT CHEST WITH CONTRAST TECHNIQUE: Multidetector CT imaging of the chest was performed during intravenous contrast administration. CONTRAST:  75mL ISOVUE-300 IOPAMIDOL (ISOVUE-300) INJECTION 61% COMPARISON:  Chest radiograph performed 10/11/2016, and earlier today at 6:32 p.m. FINDINGS: Cardiovascular: The heart is enlarged. Diffuse coronary artery calcifications are seen. Scattered calcification is left along the thoracic aorta. The proximal right vessels are grossly unremarkable. 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 Mediastinum/Nodes: The mediastinum is otherwise unremarkable. No mediastinal lymphadenopathy is seen. No pericardial effusion is identified. The thyroid gland is unremarkable in appearance. No axillary lymphadenopathy is seen. Lungs/Pleura: A 3.4 cm masslike density is noted at the right lower lobe, with an adjacent area of consolidation of the right lower lobe. This demonstrates areas of decreased attenuation, raising question for necrotizing pneumonia. An underlying loculated trace right pleural effusion is noted. Trace left-sided pleural fluid is noted, with underlying irregular pleural thickening and calcification along the anterolateral aspect of the left hemithorax. No definite focal mass is seen to suggest mesothelioma, though it cannot be entirely excluded. No pneumothorax is seen. Upper Abdomen: The visualized portions of the liver and spleen are grossly unremarkable. Moderate bilateral renal atrophy is noted. Musculoskeletal: No acute osseous  abnormalities are identified. Degenerative change is noted about the right humeral head. The visualized musculature is unremarkable in appearance. IMPRESSION: 1. 3.4 cm masslike density at the right lower lung lobe, with adjacent area of consolidation of the right lower lobe. This demonstrates areas of decreased attenuation, raising question for necrotizing pneumonia. Underlying loculated trace right pleural effusion noted. Bronchoalveolar lavage could be considered, if deemed clinically appropriate, or could treat for pneumonia and perform follow-up PET/CT after resolution of symptoms to assess for underlying malignancy. 2. Irregular pleural thickening and calcification along the anterolateral aspect of the left hemithorax, with trace left-sided pleural fluid. No definite focal mass seen to suggest mesothelioma, though it cannot be entirely excluded. This could also be further assessed on PET/CT if deemed clinically appropriate, after resolution of the patient's pneumonia. 3. Cardiomegaly.  Diffuse coronary artery calcifications seen. 4. Incidental note of a retroesophageal right subclavian artery. 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 This does not appear to propagate more distally. 5. Degenerative change about the right glenohumeral joint. Electronically Signed   By: Roanna Raider M.D.   On: 02/09/2017 23:52   Mr Brain Wo Contrast  Result Date: 02/18/2017 CLINICAL DATA:  Slurred speech EXAM: MRI HEAD WITHOUT CONTRAST MRA HEAD WITHOUT CONTRAST TECHNIQUE: Multiplanar, multiecho pulse sequences of the brain and surrounding structures were obtained without intravenous contrast. Angiographic images of the head were obtained using MRA technique without contrast. COMPARISON:  CT head 02/17/2017 FINDINGS: MRI HEAD FINDINGS Image quality degraded by moderate motion. Brain: Negative for acute infarct. Moderate chronic microvascular ischemic change in the cerebral white  matter. Moderate cerebral atrophy. Negative for hydrocephalus. Negative for hemorrhage, fluid collection, mass, or edema. Vascular: Normal arterial flow voids. Skull and upper cervical spine: No destructive skeletal lesion. Moderate  to advanced cervical spondylosis diffusely with kyphosis and spinal stenosis. Sinuses/Orbits: Negative Other: None MRA HEAD FINDINGS Image quality degraded by moderate motion. Distal right vertebral artery widely patent to the basilar. Right PICA patent. Left vertebral ends in PICA. Mild stenosis of the basilar. Posterior cerebral arteries patent bilaterally Internal carotid artery patent bilaterally with mild irregularity in the cavernous segment. Anterior and middle cerebral arteries patent bilaterally. M2 and M3 branches not well evaluated due to motion. IMPRESSION: Negative for acute infarct Moderate atrophy and moderate chronic microvascular ischemia MRA degraded by significant motion.  No large vessel occlusion. Electronically Signed   By: Marlan Palau M.D.   On: 02/18/2017 07:09   Mr Maxine Glenn Head/brain AV Cm  Result Date: 02/18/2017 CLINICAL DATA:  Slurred speech EXAM: MRI HEAD WITHOUT CONTRAST MRA HEAD WITHOUT CONTRAST TECHNIQUE: Multiplanar, multiecho pulse sequences of the brain and surrounding structures were obtained without intravenous contrast. Angiographic images of the head were obtained using MRA technique without contrast. COMPARISON:  CT head 02/17/2017 FINDINGS: MRI HEAD FINDINGS Image quality degraded by moderate motion. Brain: Negative for acute infarct. Moderate chronic microvascular ischemic change in the cerebral white matter. Moderate cerebral atrophy. Negative for hydrocephalus. Negative for hemorrhage, fluid collection, mass, or edema. Vascular: Normal arterial flow voids. Skull and upper cervical spine: No destructive skeletal lesion. Moderate to advanced cervical spondylosis diffusely with kyphosis and spinal stenosis. Sinuses/Orbits: Negative Other: None  MRA HEAD FINDINGS Image quality degraded by moderate motion. Distal right vertebral artery widely patent to the basilar. Right PICA patent. Left vertebral ends in PICA. Mild stenosis of the basilar. Posterior cerebral arteries patent bilaterally Internal carotid artery patent bilaterally with mild irregularity in the cavernous segment. Anterior and middle cerebral arteries patent bilaterally. M2 and M3 branches not well evaluated due to motion. IMPRESSION: Negative for acute infarct Moderate atrophy and moderate chronic microvascular ischemia MRA degraded by significant motion.  No large vessel occlusion. Electronically Signed   By: Marlan Palau M.D.   On: 02/18/2017 07:09     Subjective: Patient is feeling better, no nausea or vomiting, tolerating po well. No chest pain.   Discharge Exam: Vitals:   02/20/17 2122 02/21/17 0507  BP: 113/64 135/75  Pulse: (!) 51 76  Resp: 16 16  Temp: 97.7 F (36.5 C) 98.3 F (36.8 C)   Vitals:   02/20/17 0608 02/20/17 1336 02/20/17 2122 02/21/17 0507  BP: (!) 172/83 122/67 113/64 135/75  Pulse: 88 67 (!) 51 76  Resp: Temp: 97.7 F (36.5 C) 97.8 F (36.6 C) 97.7 F (36.5 C) 98.3 F (36.8 C)  TempSrc:  Axillary Oral Oral  SpO2: 100% 98% 98% 99%  Weight: 45.1 kg (99 lb 6.8 oz)   45 kg (99 lb 3.3 oz)  Height:        General: Pt is alert, awake, not in acute distress Cardiovascular: RRR, S1/S2 +, no rubs, no gallops Respiratory: CTA bilaterally, no wheezing, no rhonchi Abdominal: Soft, NT, ND, bowel sounds + Extremities: no edema, no cyanosis    The results of significant diagnostics from this hospitalization (including imaging, microbiology, ancillary and laboratory) are listed below for reference.     Microbiology: No results found for this or any previous visit (from the past 240 hour(s)).   Labs: BNP (last 3 results)  Recent Labs  02/10/17 0549 02/17/17 1358  BNP 470.4* 337.5*   Basic Metabolic Panel:  Recent  Labs Lab 02/18/17 0436 02/18/17 1410 02/19/17 0542 02/20/17 0355 02/21/17 0445  NA 124* 126* 125* 125* 128*  K 3.4* 3.7 3.8 3.7 3.4*  CL 89* 90* 93* 90* 94*  CO2 GLUCOSE 76 131* 90 124* 79  BUN CREATININE 0.70 0.84 0.63 0.65 0.84  CALCIUM 8.2* 8.3* 8.3* 8.8* 8.2*  MG 1.3*  --   --   --   --   PHOS 2.5  --   --   --   --    Liver Function Tests:  Recent Labs Lab 02/17/17 1358 02/18/17 0436  AST 21 19  ALT 12* 10*  ALKPHOS 47 36*  BILITOT 1.0 0.9  PROT 6.7 5.4*  ALBUMIN 3.7 3.1*   No results for input(s): LIPASE, AMYLASE in the last 168 hours. No results for input(s): AMMONIA in the last 168 hours. CBC:  Recent Labs Lab 02/17/17 1358 02/18/17 0436 02/20/17 0355 02/20/17 0806 02/21/17 0445  WBC 11.2* 9.6 13.3* 11.6* 10.4  NEUTROABS  --  5.4  --   --  6.7  HGB 11.8* 10.6* 11.2* 10.5* 10.2*  HCT 34.4* 30.3* 33.0* 31.3* 30.3*  MCV 72.7* 72.7* 73.2* 72.3* 73.7*  PLT 431* 379 477* 421* 384   Cardiac Enzymes: No results for input(s): CKTOTAL, CKMB, CKMBINDEX, TROPONINI in the last 168 hours. BNP: Invalid input(s): POCBNP CBG:  Recent Labs Lab 02/17/17 2115  GLUCAP 88   D-Dimer No results for input(s): DDIMER in the last 72 hours. Hgb A1c No results for input(s): HGBA1C in the last 72 hours. Lipid Profile No results for input(s): CHOL, HDL, LDLCALC, TRIG, CHOLHDL, LDLDIRECT in the last 72 hours. Thyroid function studies No results for input(s): TSH, T4TOTAL, T3FREE, THYROIDAB in the last 72 hours.  Invalid input(s): FREET3 Anemia work up No results for input(s): VITAMINB12, FOLATE, FERRITIN, TIBC, IRON, RETICCTPCT in the last 72 hours. Urinalysis    Component Value Date/Time   COLORURINE YELLOW 02/17/2017 2200   APPEARANCEUR CLEAR 02/17/2017 2200   LABSPEC 1.005 02/17/2017 2200   PHURINE 7.0 02/17/2017 2200   GLUCOSEU NEGATIVE 02/17/2017 2200   HGBUR NEGATIVE 02/17/2017 2200   BILIRUBINUR NEGATIVE 02/17/2017 2200    KETONESUR 5 (A) 02/17/2017 2200   PROTEINUR NEGATIVE 02/17/2017 2200   UROBILINOGEN 0.2 12/22/2014 0903   NITRITE NEGATIVE 02/17/2017 2200   LEUKOCYTESUR NEGATIVE 02/17/2017 2200   Sepsis Labs Invalid input(s): PROCALCITONIN,  WBC,  LACTICIDVEN Microbiology No results found for this or any previous visit (from the past 240 hour(s)).   Time coordinating discharge: 45 minutes  SIGNED:   Coralie Keens, MD  [facility] 02/21/2017, 9:16 AM Pager   If 7PM-7AM, please contact night-coverage [email or web address] Password [email or web address]

## 2017-02-21 NOTE — Progress Notes (Signed)
qPhysical Therapy Treatment Patient Details Name: [name] Fry MRN: [id number] DOB: December 29, 1919 Today's Date: 02/21/2017    History of Present Illness 83 yo female admitted with slurred speech and found to have symptomatic hyponatremia. Hx of TIA, CVA, osteoporosis, R hip THA-post 2015, and CHF; MRI test results negative.    PT Comments    Pt assisted to [facility] and then ambulated very short distance.  Pt reports fatigue today.    Follow Up Recommendations  Supervision/Assistance - 24 hour;Home health PT     Equipment Recommendations  None recommended by PT    Recommendations for Other Services       Precautions / Restrictions Precautions Precautions: Fall Restrictions Weight Bearing Restrictions: No    Mobility  Bed Mobility Overal bed mobility: Needs Assistance Bed Mobility: Supine to Sit;Sit to Supine     Supine to sit: Min assist;HOB elevated Sit to supine: Mod assist   General bed mobility comments: assist for trunk upright and LEs onto bed  Transfers Overall transfer level: Needs assistance Equipment used: Rolling walker (2 wheeled) Transfers: Sit to/from [employer] Sit to Stand: Min assist Stand pivot transfers: Min assist       General transfer comment: verbal cues for safe technique, posterior lean against surface upon rise to self stabilize however requires assist  Ambulation/Gait Ambulation/Gait assistance: Min assist Ambulation Distance (Feet): 15 Feet Assistive device: Rolling walker (2 wheeled) Gait Pattern/deviations: Step-through pattern;Decreased stride length Gait velocity: decr   General Gait Details: assist to occasionally maneuver RW, multimodal cues for step length and RW positioning, distance limited by fatigue   Stairs            Wheelchair Mobility    Modified Rankin (Stroke Patients Only)       Balance Overall balance assessment: Needs assistance;History of Falls Sitting-balance support: Feet  supported Sitting balance-Leahy Scale: Fair     Standing balance support: Bilateral upper extremity supported Standing balance-Leahy Scale: Poor Standing balance comment: requires RW                            Cognition Arousal/Alertness: Awake/alert Behavior During Therapy: WFL for tasks assessed/performed Overall Cognitive Status: Impaired/Different from baseline                         Following Commands: Follows one step commands with increased time              Exercises      General Comments        Pertinent Vitals/Pain Pain Assessment: No/denies pain Faces Pain Scale: Hurts little more Pain Location: Head Pain Descriptors / Indicators: Aching Pain Intervention(s): Limited activity within patient's tolerance;Monitored during session;RN gave pain meds during session    Home Living                      Prior Function            PT Goals (current goals can now be found in the care plan section) Progress towards PT goals: Progressing toward goals    Frequency    Min 3X/week      PT Plan Current plan remains appropriate    Co-evaluation             End of Session   Activity Tolerance: Patient limited by fatigue Patient left: with call bell/phone within reach;with bed alarm set;in bed   PT Visit Diagnosis:  Muscle weakness (generalized) (M62.81);Difficulty in walking, not elsewhere classified (R26.2)     Time: 1610-9604 PT Time Calculation (min) (ACUTE ONLY): 22 min  Charges:  $Gait Training: 8-22 mins                    G Codes:       Zenovia Jarred, PT, DPT 02/21/2017 Pager: [phone]    Maida Sale E 02/21/2017, 12:57 PM

## 2017-02-23 DIAGNOSIS — I5023 Acute on chronic systolic (congestive) heart failure: Secondary | ICD-10-CM | POA: Diagnosis not present

## 2017-02-23 DIAGNOSIS — Z96641 Presence of right artificial hip joint: Secondary | ICD-10-CM | POA: Diagnosis not present

## 2017-02-23 DIAGNOSIS — R1310 Dysphagia, unspecified: Secondary | ICD-10-CM | POA: Diagnosis not present

## 2017-02-23 DIAGNOSIS — I69391 Dysphagia following cerebral infarction: Secondary | ICD-10-CM | POA: Diagnosis not present

## 2017-02-23 DIAGNOSIS — I110 Hypertensive heart disease with heart failure: Secondary | ICD-10-CM | POA: Diagnosis not present

## 2017-02-23 DIAGNOSIS — E7800 Pure hypercholesterolemia, unspecified: Secondary | ICD-10-CM | POA: Diagnosis not present

## 2017-02-23 DIAGNOSIS — M1990 Unspecified osteoarthritis, unspecified site: Secondary | ICD-10-CM | POA: Diagnosis not present

## 2017-02-23 DIAGNOSIS — K219 Gastro-esophageal reflux disease without esophagitis: Secondary | ICD-10-CM | POA: Diagnosis not present

## 2017-02-23 DIAGNOSIS — E871 Hypo-osmolality and hyponatremia: Secondary | ICD-10-CM | POA: Diagnosis not present

## 2017-03-03 DIAGNOSIS — I69391 Dysphagia following cerebral infarction: Secondary | ICD-10-CM | POA: Diagnosis not present

## 2017-03-03 DIAGNOSIS — I5023 Acute on chronic systolic (congestive) heart failure: Secondary | ICD-10-CM | POA: Diagnosis not present

## 2017-03-03 DIAGNOSIS — E7800 Pure hypercholesterolemia, unspecified: Secondary | ICD-10-CM | POA: Diagnosis not present

## 2017-03-03 DIAGNOSIS — I110 Hypertensive heart disease with heart failure: Secondary | ICD-10-CM | POA: Diagnosis not present

## 2017-03-03 DIAGNOSIS — R1310 Dysphagia, unspecified: Secondary | ICD-10-CM | POA: Diagnosis not present

## 2017-03-03 DIAGNOSIS — Z96641 Presence of right artificial hip joint: Secondary | ICD-10-CM | POA: Diagnosis not present

## 2017-03-03 DIAGNOSIS — M1990 Unspecified osteoarthritis, unspecified site: Secondary | ICD-10-CM | POA: Diagnosis not present

## 2017-03-03 DIAGNOSIS — E871 Hypo-osmolality and hyponatremia: Secondary | ICD-10-CM | POA: Diagnosis not present

## 2017-03-03 DIAGNOSIS — K219 Gastro-esophageal reflux disease without esophagitis: Secondary | ICD-10-CM | POA: Diagnosis not present

## 2017-03-04 DIAGNOSIS — Z681 Body mass index (BMI) 19 or less, adult: Secondary | ICD-10-CM | POA: Diagnosis not present

## 2017-03-04 DIAGNOSIS — I509 Heart failure, unspecified: Secondary | ICD-10-CM | POA: Diagnosis not present

## 2017-03-04 DIAGNOSIS — E871 Hypo-osmolality and hyponatremia: Secondary | ICD-10-CM | POA: Diagnosis not present

## 2017-03-04 DIAGNOSIS — I10 Essential (primary) hypertension: Secondary | ICD-10-CM | POA: Diagnosis not present

## 2017-03-04 DIAGNOSIS — J984 Other disorders of lung: Secondary | ICD-10-CM | POA: Diagnosis not present

## 2017-03-08 DIAGNOSIS — E7800 Pure hypercholesterolemia, unspecified: Secondary | ICD-10-CM | POA: Diagnosis not present

## 2017-03-08 DIAGNOSIS — M1990 Unspecified osteoarthritis, unspecified site: Secondary | ICD-10-CM | POA: Diagnosis not present

## 2017-03-08 DIAGNOSIS — Z96641 Presence of right artificial hip joint: Secondary | ICD-10-CM | POA: Diagnosis not present

## 2017-03-08 DIAGNOSIS — E871 Hypo-osmolality and hyponatremia: Secondary | ICD-10-CM | POA: Diagnosis not present

## 2017-03-08 DIAGNOSIS — I69391 Dysphagia following cerebral infarction: Secondary | ICD-10-CM | POA: Diagnosis not present

## 2017-03-08 DIAGNOSIS — K219 Gastro-esophageal reflux disease without esophagitis: Secondary | ICD-10-CM | POA: Diagnosis not present

## 2017-03-08 DIAGNOSIS — I110 Hypertensive heart disease with heart failure: Secondary | ICD-10-CM | POA: Diagnosis not present

## 2017-03-08 DIAGNOSIS — R1310 Dysphagia, unspecified: Secondary | ICD-10-CM | POA: Diagnosis not present

## 2017-03-08 DIAGNOSIS — I5023 Acute on chronic systolic (congestive) heart failure: Secondary | ICD-10-CM | POA: Diagnosis not present

## 2017-03-17 DIAGNOSIS — R1310 Dysphagia, unspecified: Secondary | ICD-10-CM | POA: Diagnosis not present

## 2017-03-17 DIAGNOSIS — E871 Hypo-osmolality and hyponatremia: Secondary | ICD-10-CM | POA: Diagnosis not present

## 2017-03-17 DIAGNOSIS — E7800 Pure hypercholesterolemia, unspecified: Secondary | ICD-10-CM | POA: Diagnosis not present

## 2017-03-17 DIAGNOSIS — I5023 Acute on chronic systolic (congestive) heart failure: Secondary | ICD-10-CM | POA: Diagnosis not present

## 2017-03-17 DIAGNOSIS — I69391 Dysphagia following cerebral infarction: Secondary | ICD-10-CM | POA: Diagnosis not present

## 2017-03-17 DIAGNOSIS — Z96641 Presence of right artificial hip joint: Secondary | ICD-10-CM | POA: Diagnosis not present

## 2017-03-17 DIAGNOSIS — K219 Gastro-esophageal reflux disease without esophagitis: Secondary | ICD-10-CM | POA: Diagnosis not present

## 2017-03-17 DIAGNOSIS — I110 Hypertensive heart disease with heart failure: Secondary | ICD-10-CM | POA: Diagnosis not present

## 2017-03-17 DIAGNOSIS — M1990 Unspecified osteoarthritis, unspecified site: Secondary | ICD-10-CM | POA: Diagnosis not present

## 2017-03-18 DIAGNOSIS — J984 Other disorders of lung: Secondary | ICD-10-CM | POA: Diagnosis not present

## 2017-03-18 DIAGNOSIS — I10 Essential (primary) hypertension: Secondary | ICD-10-CM | POA: Diagnosis not present

## 2017-03-18 DIAGNOSIS — D649 Anemia, unspecified: Secondary | ICD-10-CM | POA: Diagnosis not present

## 2017-03-18 DIAGNOSIS — E871 Hypo-osmolality and hyponatremia: Secondary | ICD-10-CM | POA: Diagnosis not present

## 2017-03-18 DIAGNOSIS — I509 Heart failure, unspecified: Secondary | ICD-10-CM | POA: Diagnosis not present

## 2017-03-18 DIAGNOSIS — Z681 Body mass index (BMI) 19 or less, adult: Secondary | ICD-10-CM | POA: Diagnosis not present

## 2017-04-11 DIAGNOSIS — I10 Essential (primary) hypertension: Secondary | ICD-10-CM | POA: Diagnosis not present

## 2017-04-11 DIAGNOSIS — Z681 Body mass index (BMI) 19 or less, adult: Secondary | ICD-10-CM | POA: Diagnosis not present

## 2017-04-11 DIAGNOSIS — I509 Heart failure, unspecified: Secondary | ICD-10-CM | POA: Diagnosis not present

## 2017-04-11 DIAGNOSIS — R609 Edema, unspecified: Secondary | ICD-10-CM | POA: Diagnosis not present

## 2017-04-11 DIAGNOSIS — E871 Hypo-osmolality and hyponatremia: Secondary | ICD-10-CM | POA: Diagnosis not present

## 2017-04-26 DIAGNOSIS — E871 Hypo-osmolality and hyponatremia: Secondary | ICD-10-CM | POA: Diagnosis not present

## 2017-04-26 DIAGNOSIS — Z681 Body mass index (BMI) 19 or less, adult: Secondary | ICD-10-CM | POA: Diagnosis not present

## 2017-04-26 DIAGNOSIS — R05 Cough: Secondary | ICD-10-CM | POA: Diagnosis not present

## 2017-04-26 DIAGNOSIS — I10 Essential (primary) hypertension: Secondary | ICD-10-CM | POA: Diagnosis not present

## 2017-04-26 DIAGNOSIS — I509 Heart failure, unspecified: Secondary | ICD-10-CM | POA: Diagnosis not present

## 2017-05-03 ENCOUNTER — Emergency Department (HOSPITAL_COMMUNITY): Payer: PPO

## 2017-05-03 ENCOUNTER — Inpatient Hospital Stay (HOSPITAL_COMMUNITY)
Admission: EM | Admit: 2017-05-03 | Discharge: 2017-05-07 | DRG: 643 | Disposition: A | Discharge status: Home Health | Payer: PPO | Attending: Internal Medicine | Admitting: Internal Medicine

## 2017-05-03 ENCOUNTER — Encounter (HOSPITAL_COMMUNITY): Payer: Self-pay | Admitting: Nurse Practitioner

## 2017-05-03 DIAGNOSIS — E43 Unspecified severe protein-calorie malnutrition: Secondary | ICD-10-CM | POA: Diagnosis present

## 2017-05-03 DIAGNOSIS — K219 Gastro-esophageal reflux disease without esophagitis: Secondary | ICD-10-CM | POA: Diagnosis present

## 2017-05-03 DIAGNOSIS — Z96641 Presence of right artificial hip joint: Secondary | ICD-10-CM | POA: Diagnosis present

## 2017-05-03 DIAGNOSIS — I110 Hypertensive heart disease with heart failure: Secondary | ICD-10-CM | POA: Diagnosis not present

## 2017-05-03 DIAGNOSIS — Z6820 Body mass index (BMI) 20.0-20.9, adult: Secondary | ICD-10-CM

## 2017-05-03 DIAGNOSIS — E7800 Pure hypercholesterolemia, unspecified: Secondary | ICD-10-CM | POA: Diagnosis present

## 2017-05-03 DIAGNOSIS — E860 Dehydration: Secondary | ICD-10-CM | POA: Diagnosis not present

## 2017-05-03 DIAGNOSIS — T501X5A Adverse effect of loop [high-ceiling] diuretics, initial encounter: Secondary | ICD-10-CM | POA: Diagnosis present

## 2017-05-03 DIAGNOSIS — I5042 Chronic combined systolic (congestive) and diastolic (congestive) heart failure: Secondary | ICD-10-CM | POA: Diagnosis present

## 2017-05-03 DIAGNOSIS — Z79899 Other long term (current) drug therapy: Secondary | ICD-10-CM

## 2017-05-03 DIAGNOSIS — F0390 Unspecified dementia without behavioral disturbance: Secondary | ICD-10-CM | POA: Diagnosis not present

## 2017-05-03 DIAGNOSIS — Z66 Do not resuscitate: Secondary | ICD-10-CM | POA: Diagnosis present

## 2017-05-03 DIAGNOSIS — I10 Essential (primary) hypertension: Secondary | ICD-10-CM | POA: Diagnosis present

## 2017-05-03 DIAGNOSIS — R400 Somnolence: Secondary | ICD-10-CM

## 2017-05-03 DIAGNOSIS — E785 Hyperlipidemia, unspecified: Secondary | ICD-10-CM | POA: Diagnosis not present

## 2017-05-03 DIAGNOSIS — G9341 Metabolic encephalopathy: Secondary | ICD-10-CM | POA: Diagnosis not present

## 2017-05-03 DIAGNOSIS — I517 Cardiomegaly: Secondary | ICD-10-CM | POA: Diagnosis not present

## 2017-05-03 DIAGNOSIS — Z8673 Personal history of transient ischemic attack (TIA), and cerebral infarction without residual deficits: Secondary | ICD-10-CM | POA: Diagnosis not present

## 2017-05-03 DIAGNOSIS — E222 Syndrome of inappropriate secretion of antidiuretic hormone: Principal | ICD-10-CM | POA: Diagnosis present

## 2017-05-03 DIAGNOSIS — R410 Disorientation, unspecified: Secondary | ICD-10-CM | POA: Diagnosis not present

## 2017-05-03 DIAGNOSIS — I5043 Acute on chronic combined systolic (congestive) and diastolic (congestive) heart failure: Secondary | ICD-10-CM | POA: Diagnosis not present

## 2017-05-03 DIAGNOSIS — R918 Other nonspecific abnormal finding of lung field: Secondary | ICD-10-CM | POA: Diagnosis not present

## 2017-05-03 DIAGNOSIS — I429 Cardiomyopathy, unspecified: Secondary | ICD-10-CM | POA: Diagnosis present

## 2017-05-03 DIAGNOSIS — E871 Hypo-osmolality and hyponatremia: Secondary | ICD-10-CM | POA: Diagnosis present

## 2017-05-03 DIAGNOSIS — J90 Pleural effusion, not elsewhere classified: Secondary | ICD-10-CM | POA: Diagnosis not present

## 2017-05-03 HISTORY — DX: Hypo-osmolality and hyponatremia: E87.1

## 2017-05-03 LAB — BASIC METABOLIC PANEL
Anion gap: 9 (ref 5–15)
BUN: 8 mg/dL (ref 6–20)
CALCIUM: 9.1 mg/dL (ref 8.9–10.3)
CO2: 28 mmol/L (ref 22–32)
CREATININE: 0.81 mg/dL (ref 0.44–1.00)
Chloride: 81 mmol/L — ABNORMAL LOW (ref 101–111)
GFR calc non Af Amer: 60 mL/min — ABNORMAL LOW (ref >60)
GLUCOSE: 106 mg/dL — AB (ref 65–99)
Potassium: 3.7 mmol/L (ref 3.5–5.1)
Sodium: 118 mmol/L — CL (ref 135–145)

## 2017-05-03 LAB — BRAIN NATRIURETIC PEPTIDE: B Natriuretic Peptide: 527.4 pg/mL — ABNORMAL HIGH (ref 0.0–100.0)

## 2017-05-03 LAB — CBC
HCT: 37.8 % (ref 36.0–46.0)
Hemoglobin: 13.1 g/dL (ref 12.0–15.0)
MCH: 26.5 pg (ref 26.0–34.0)
MCHC: 34.7 g/dL (ref 30.0–36.0)
MCV: 76.5 fL — ABNORMAL LOW (ref 78.0–100.0)
PLATELETS: 350 10*3/uL (ref 150–400)
RBC: 4.94 MIL/uL (ref 3.87–5.11)
RDW: 14.6 % (ref 11.5–15.5)
WBC: 11 10*3/uL — ABNORMAL HIGH (ref 4.0–10.5)

## 2017-05-03 LAB — CBG MONITORING, ED: GLUCOSE-CAPILLARY: 96 mg/dL (ref 65–99)

## 2017-05-03 MED ORDER — LORATADINE 10 MG PO TABS
10.0000 mg | ORAL_TABLET | Freq: Every day | ORAL | Status: DC | PRN
  Filled 2017-05-03: qty 1

## 2017-05-03 MED ORDER — PANTOPRAZOLE SODIUM 40 MG PO TBEC
40.0000 mg | DELAYED_RELEASE_TABLET | Freq: Every day | ORAL | Status: DC
  Administered 2017-05-04 – 2017-05-07 (×4): 40 mg via ORAL
  Filled 2017-05-03 (×4): qty 1

## 2017-05-03 MED ORDER — SODIUM CHLORIDE 0.9 % IV SOLN: INTRAVENOUS | Status: DC

## 2017-05-03 MED ORDER — HYDRALAZINE HCL 20 MG/ML IJ SOLN
5.0000 mg | INTRAMUSCULAR | Status: DC | PRN
  Administered 2017-05-06: 5 mg via INTRAVENOUS
  Filled 2017-05-03 (×2): qty 1

## 2017-05-03 MED ORDER — SODIUM CHLORIDE 0.9% FLUSH
3.0000 mL | Freq: Two times a day (BID) | INTRAVENOUS | Status: DC
  Administered 2017-05-04 – 2017-05-07 (×8): 3 mL via INTRAVENOUS

## 2017-05-03 MED ORDER — ENOXAPARIN SODIUM 40 MG/0.4ML ~~LOC~~ SOLN
40.0000 mg | Freq: Every day | SUBCUTANEOUS | Status: DC
  Administered 2017-05-04: 40 mg via SUBCUTANEOUS
  Filled 2017-05-03: qty 0.4

## 2017-05-03 MED ORDER — CARVEDILOL 3.125 MG PO TABS
3.1250 mg | ORAL_TABLET | Freq: Two times a day (BID) | ORAL | Status: DC
  Administered 2017-05-04 – 2017-05-07 (×7): 3.125 mg via ORAL
  Filled 2017-05-03 (×8): qty 1

## 2017-05-03 MED ORDER — DOCUSATE SODIUM 100 MG PO CAPS: 100.0000 mg | ORAL_CAPSULE | Freq: Every day | ORAL | Status: DC | PRN

## 2017-05-03 MED ORDER — CALCIUM CARBONATE-VITAMIN D 500-200 MG-UNIT PO TABS
1.0000 | ORAL_TABLET | Freq: Every day | ORAL | Status: DC
  Administered 2017-05-04 – 2017-05-07 (×4): 1 via ORAL
  Filled 2017-05-03 (×4): qty 1

## 2017-05-03 MED ORDER — SODIUM CHLORIDE 0.9 % IV BOLUS (SEPSIS)
500.0000 mL | Freq: Once | INTRAVENOUS | Status: AC
Start: 2017-05-03 — End: 2017-05-03
  Administered 2017-05-03: 500 mL via INTRAVENOUS

## 2017-05-03 MED ORDER — SODIUM CHLORIDE 0.9 % IV SOLN
INTRAVENOUS | Status: DC
  Administered 2017-05-04 – 2017-05-05 (×2): via INTRAVENOUS

## 2017-05-03 MED ORDER — HYDRALAZINE HCL 50 MG PO TABS
75.0000 mg | ORAL_TABLET | Freq: Three times a day (TID) | ORAL | Status: DC
  Administered 2017-05-04 – 2017-05-07 (×9): 75 mg via ORAL
  Filled 2017-05-03 (×10): qty 1

## 2017-05-03 MED ORDER — ACETAMINOPHEN 325 MG PO TABS: 325.0000 mg | ORAL_TABLET | Freq: Four times a day (QID) | ORAL | Status: DC | PRN

## 2017-05-03 MED ORDER — SODIUM CHLORIDE 3 % IV SOLN: INTRAVENOUS | Status: DC

## 2017-05-03 NOTE — ED Provider Notes (Signed)
[facility]-EMERGENCY DEPT Provider Note   CSN: [id number] Arrival date & time: 05/03/17  1800     History   Chief Complaint Chief Complaint  Patient presents with  . Weakness    HPI SEE BEHARRY is a 83 y.o. female.  The history is provided by the patient, medical records and a relative. The history is limited by the condition of the patient. No language interpreter was used.  Altered Mental Status   This is a recurrent problem. The current episode started yesterday. The problem has been gradually worsening. Associated symptoms include confusion and somnolence. Pertinent negatives include no weakness, no agitation, no delusions, no hallucinations and no violence. Risk factors include a recent illness. Her past medical history is significant for CVA and hypertension.    Past Medical History:  Diagnosis Date  . Arthritis    "arms" (10/13/2016)  . High cholesterol   . Hypertension   . Hyponatremia   . Stroke [facility]) ~ 2015   "swallow difficulties since" (10/13/2016)    Patient Active Problem List   Diagnosis Date Noted  . Encephalopathy 02/17/2017  . Slurred speech 02/17/2017  . Acute encephalopathy 02/09/2017  . Pleural effusion   . Goals of care, counseling/discussion   . Palliative care encounter   . Abdominal pain   . Upper GI bleed   . Generalized weakness   . Hyponatremia 10/08/2016  . Acute hyponatremia 10/08/2016  . Chronic anemia 12/23/2014  . Leukocytosis 12/23/2014  . History of pleural effusion 12/23/2014  . Aphasia 12/22/2014  . TIA (transient ischemic attack) 12/22/2014  . History of CVA (cerebrovascular accident) without residual deficits 12/22/2014  . Vitamin D deficiency 12/22/2014  . Allergic rhinitis 12/22/2014  . Osteoporosis 12/22/2014  . Protein-calorie malnutrition, severe (HCC) 07/11/2014  . Femoral neck fracture (HCC) 07/10/2014  . HTN (hypertension) 07/10/2014  . HLD (hyperlipidemia) 07/10/2014  . Closed right hip fracture (HCC) 07/10/2014      Past Surgical History:  Procedure Laterality Date  . ABDOMINAL HERNIA REPAIR    . CATARACT EXTRACTION Right   . HERNIA REPAIR    . HIP ARTHROPLASTY Right 07/10/2014   Procedure: ARTHROPLASTY BIPOLAR HIP;  Surgeon: Cheral Almas, MD;  Location: [facility] OR;  Service: Orthopedics;  Laterality: Right;  . JOINT REPLACEMENT      OB History    No data available       Home Medications    Prior to Admission medications   Medication Sig Start Date End Date Taking? Authorizing Provider  acetaminophen (TYLENOL) 325 MG tablet Take 650 mg by mouth every 6 (six) hours as needed for mild pain or moderate pain.    [provider]  calcium-vitamin D (OSCAL WITH D) 500-200 MG-UNIT per tablet Take 1 tablet by mouth daily with breakfast. Patient taking differently: Take 1 tablet by mouth daily.  07/10/14   Tarry Kos, MD  carvedilol (COREG) 3.125 MG tablet Take 1 tablet (3.125 mg total) by mouth 2 (two) times daily with a meal. 02/11/17   Leroy Sea, MD  Docusate Calcium (STOOL SOFTENER PO) Take 1 capsule by mouth as needed (for constipation).    [provider]  furosemide (LASIX) 20 MG tablet Take 1 tablet (20 mg total) by mouth daily. 02/11/17   Leroy Sea, MD  hydrALAZINE (APRESOLINE) 50 MG tablet Take 1.5 tablets (75 mg total) by mouth 3 (three) times daily. 12/26/14   Ames Dura, MD  loratadine (CLARITIN) 10 MG tablet Take 10 mg by mouth daily.  [provider]  pantoprazole (PROTONIX) 40 MG tablet Take 1 tablet (40 mg total) by mouth daily. 02/22/17   Arrien, York RamMauricio Daniel, MD  Polysaccharide Iron Complex (FERREX 150 PO) Take 150 mg by mouth daily.    [provider]  Potassium Chloride ER 20 MEQ TBCR Take 20 mEq by mouth daily. 02/11/17   Leroy SeaSingh, Prashant K, MD  Pramoxine-Camphor-Zinc Acetate ([facility]I [facility] EX) Apply 1 application topically as needed (for itching).    [provider]  pravastatin (PRAVACHOL) 40 MG tablet Take 40 mg by  mouth daily.    [provider]  spironolactone (ALDACTONE) 50 MG tablet Take 50 mg by mouth daily.    [provider]    Family History Family History  Problem Relation Age of Onset  . Dementia Other   . CAD Neg Hx     Social History Social History  Substance Use Topics  . Smoking status: Never Smoker  . Smokeless tobacco: Never Used  . Alcohol use No     Allergies   Patient has no known allergies.   Review of Systems Review of Systems  Constitutional: Positive for fatigue. Negative for activity change, chills, diaphoresis and fever.  HENT: Negative for congestion and rhinorrhea.   Eyes: Negative for visual disturbance.  Respiratory: Negative for cough, chest tightness, shortness of breath, wheezing and stridor.   Cardiovascular: Negative for chest pain, palpitations and leg swelling.  Gastrointestinal: Negative for abdominal distention, abdominal pain, constipation, diarrhea, nausea and vomiting.  Genitourinary: Negative for difficulty urinating, dysuria, flank pain, frequency, hematuria, menstrual problem, pelvic pain, vaginal bleeding and vaginal discharge.  Musculoskeletal: Negative for back pain and neck pain.  Skin: Negative for rash and wound.  Neurological: Negative for dizziness, weakness, light-headedness, numbness and headaches.  Psychiatric/Behavioral: Positive for confusion. Negative for agitation and hallucinations.  All other systems reviewed and are negative.    Physical Exam Updated Vital Signs BP (!) 156/84   Pulse 84   Temp 97.7 F (36.5 C) (Oral)   Resp 16   SpO2 92%   Physical Exam  Constitutional: She is oriented to person, place, and time. She appears well-developed and well-nourished. No distress.  HENT:  Head: Normocephalic and atraumatic.  Mouth/Throat: Oropharynx is clear and moist.  Eyes: Conjunctivae are normal.  Neck: Neck supple.  Cardiovascular: Normal rate and regular rhythm.   No murmur  heard. Pulmonary/Chest: Effort normal and breath sounds normal. No stridor. No respiratory distress. She has no wheezes. She exhibits no tenderness.  Abdominal: Soft. There is no tenderness.  Musculoskeletal: She exhibits no edema or tenderness.  Neurological: She is alert and oriented to person, place, and time. She is not disoriented. She displays no tremor and normal reflexes. No cranial nerve deficit or sensory deficit. She exhibits normal muscle tone. Coordination normal. GCS eye subscore is 4. GCS verbal subscore is 5. GCS motor subscore is 6.  Patient is somnolent but easily arousable. No focal neurologic deficits on my exam.  Skin: Skin is warm and dry. No rash noted. She is not diaphoretic.  Psychiatric: She has a normal mood and affect.  Nursing note and vitals reviewed.    ED Treatments / Results  Labs (all labs ordered are listed, but only abnormal results are displayed) Labs Reviewed  BASIC METABOLIC PANEL - Abnormal; Notable for the following:       Result Value   Sodium 118 (*)    Chloride 81 (*)    Glucose, Bld 106 (*)  GFR calc non Af Amer 60 (*)    All other components within normal limits  CBC - Abnormal; Notable for the following:    WBC 11.0 (*)    MCV 76.5 (*)    All other components within normal limits  BRAIN NATRIURETIC PEPTIDE - Abnormal; Notable for the following:    B Natriuretic Peptide 527.4 (*)    All other components within normal limits  URINE CULTURE  MRSA PCR SCREENING  URINALYSIS, ROUTINE W REFLEX MICROSCOPIC  BASIC METABOLIC PANEL  BASIC METABOLIC PANEL  OSMOLALITY  OSMOLALITY, URINE  SODIUM, URINE, RANDOM  TSH  CBC  BASIC METABOLIC PANEL  BASIC METABOLIC PANEL  CBG MONITORING, ED    EKG  EKG Interpretation  Date/Time:  Tuesday May 03 2017 18:24:18 EDT Ventricular Rate:  78 PR Interval:    QRS Duration: 140 QT Interval:  477 QTC Calculation: 544 R Axis:   -44 Text Interpretation:  Sinus rhythm Atrial premature complexes  Left atrial enlargement RBBB and LAFB LVH with secondary repolarization abnormality When compared to prior, no significant changes seen.  No STEMI Confirmed by Theda Belfast ([id number]) on 05/04/2017 12:40:00 AM       Radiology Dg Chest 2 View  Result Date: 05/03/2017 CLINICAL DATA:  Images obtained for confusion. Unable to obtain patient hx due to patient confusion and not being able to speak. EXAM: CHEST  2 VIEW COMPARISON:  02/17/2017 FINDINGS: Atherosclerotic aortic arch. Mild left perihilar nodularity similar to prior. Pleural thickening laterally in the left mid hemithorax. Blunting of the right costophrenic angle with nodularity along the right hemidiaphragm. Moderate enlargement of the cardiopericardial silhouette. Bony demineralization and thoracic spondylosis. Abdominal aortic atherosclerotic calcification. IMPRESSION: 1. Similar appearance of right pleural effusion, masslike appearance at the right lung base, and left pleural thickening with some nodularity. These findings were discussed in greater detail in the March 2018 CT scan as representing a combination of pleural plaques and possible right basilar mass. 2. Stable moderate cardiomegaly, without overt edema. 3.  Aortic Atherosclerosis (ICD10-I70.0). Electronically Signed   By: Gaylyn Rong M.D.   On: 05/03/2017 20:04   Ct Head Wo Contrast  Result Date: 05/03/2017 CLINICAL DATA:  Confusion and somnolence. EXAM: CT HEAD WITHOUT CONTRAST TECHNIQUE: Contiguous axial images were obtained from the base of the skull through the vertex without intravenous contrast. COMPARISON:  MRI brain 02/18/2017 FINDINGS: Brain: Calcifications in the globus pallidus nuclei bilaterally, likely physiologic, and unchanged from prior. Periventricular white matter and corona radiata hypodensities favor chronic ischemic microvascular white matter disease. Otherwise, the brainstem, cerebellum, cerebral peduncles, thalami, basal ganglia, basilar cisterns, and  ventricular system appear within normal limits. No intracranial hemorrhage, mass lesion, or acute CVA. Vascular: Cavernous carotid and vertebral artery atherosclerotic calcification observed. Skull: The odontoid extends 3 mm above the McRae line, compatible with basilar invagination. There may be some stenosis at the foramen magnum resulting. There is also calcification of the transverse ligament at C1-2. Sinuses/Orbits: Mild chronic ethmoid sinusitis. Other: No supplemental non-categorized findings. IMPRESSION: 1. No acute intracranial findings. 2. Periventricular white matter and corona radiata hypodensities favor chronic ischemic microvascular white matter disease. 3. 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 4. Atherosclerosis of the cavernous carotid arteries and vertebral arteries. Electronically Signed   By: Gaylyn Rong M.D.   On: 05/03/2017 20:24    Procedures Procedures (including critical care time)  CRITICAL CARE Performed by: Canary Brim Dominyk Law Total critical care time: 35 minutes Critical care time was exclusive of  separately billable procedures and treating other patients. Critical care was necessary to treat or prevent imminent or life-threatening deterioration. Discovered sodium of 118 with altered mental status.  Critical care was time spent personally by me on the following activities: development of treatment plan with patient and/or surrogate as well as nursing, discussions with consultants, evaluation of patient's response to treatment, examination of patient, obtaining history from patient or surrogate, ordering and performing treatments and interventions, ordering and review of laboratory studies, ordering and review of radiographic studies, pulse oximetry and re-evaluation of patient's condition.   Medications Ordered in ED Medications  pantoprazole (PROTONIX) EC tablet 40 mg (not administered)  carvedilol (COREG) tablet 3.125  mg (not administered)  docusate sodium (COLACE) capsule 100 mg (not administered)  hydrALAZINE (APRESOLINE) tablet 75 mg (not administered)  acetaminophen (TYLENOL) tablet 325 mg (not administered)  calcium-vitamin D (OSCAL WITH D) 500-200 MG-UNIT per tablet 1 tablet (not administered)  loratadine (CLARITIN) tablet 10 mg (not administered)  enoxaparin (LOVENOX) injection 40 mg (40 mg Subcutaneous Given 05/04/17 0035)  sodium chloride flush (NS) 0.9 % injection 3 mL (3 mLs Intravenous Given 05/04/17 0026)  0.9 %  sodium chloride infusion ( Intravenous New Bag/Given 05/04/17 0009)  hydrALAZINE (APRESOLINE) injection 5 mg (not administered)  sodium chloride 0.9 % bolus 500 mL (0 mLs Intravenous Stopped 05/03/17 2116)     Initial Impression / Assessment and Plan / ED Course  I have reviewed the triage vital signs and the nursing notes.  Pertinent labs & imaging results that were available during my care of the patient were reviewed by me and considered in my medical decision making (see chart for details).     TARYN SHELLHAMMER is a 83 y.o. female with a past medical history significant for hypertension, GERD, CVA, GI bleed, and recent admission for metabolic encephalopathy secondary to hyponatremia who presents for altered mental status and somnolence. Patient is currently by family report that they're concerned she is acting very sleepy. They report that she's been difficult to arouse since yesterday. They report the symptoms are similar to prior episodes of encephalopathy secondary to electrolyte abnormality. They deny patient having any fevers, chills, or injuries. Patient denies any complaints including no headaches, vision changes, nausea, vomiting, constipation, diarrhea, dysuria. Patient reports feeling tired.  History and exam are seen above. On exam, patient is arousable to voice. Patient follows all commands. Patient had normal coordination with upper extremities with finger-nose-finger.  Normal sensation in all extremity and face. Symmetric grip strength. Lungs are clear and chest is nontender. Abdomen nontender.  Due to history of encephalopathy with electrode problems, this is strongly considered. Patient will have head CT and labs to look for occult infection or other injuries.  Patient's sodium returned hyponatremic at 118. Also mild leukocytosis. CT head and chest x-ray appeared similar to prior.  Due to hyponatremia, suspect this is the cause of her altered mental status. Patient given normal saline.  Hospitalist team called and patient will be admitted for further management of her encephalopathy likely secondary to hyponatremia.     Final Clinical Impressions(s) / ED Diagnoses   Final diagnoses:  Somnolence  Hyponatremia     Clinical Impression: 1. Somnolence   2. Hyponatremia     Disposition: Admit to Hospitalist service    King Pinzon, Canary Brim, MD 05/04/17 0040

## 2017-05-03 NOTE — ED Notes (Signed)
Attempted Report 

## 2017-05-03 NOTE — ED Notes (Signed)
Dr. Rush Landmarkegeler informed of pt critical sodium.

## 2017-05-03 NOTE — H&P (Signed)
History and Physical    [name] Brunslice E Barnhill ZOX:[id number] DOB: [birth year] DOA: 05/03/2017  Referring MD/NP/PA:   PCP: Wilmer Floorampbell, Stephen D., MD   Patient coming from:  The patient is coming from home.  At baseline, pt is partially dependent for most of ADL.  Chief Complaint: Generalized weakness, lethargy and AMS  HPI: [name] Fry is a 83 y.o. female with medical history significant of hypertension, hyperlipidemia, GERD, stroke, arthritis, CHF with EF 25-30%, upper GI bleeding, hyponatremia, lung mass, who presents with generalized weakness, lethargy and AMS.  Per pt's family, she has been feeling weak in the past 3 days, which has been progressively getting worse. Patient is very lethargic, and a more sleepy, less interactive 2 families. Per family, patient does not seem to have chest pain, SOB, cough, nausea, vomiting, diarrhea. No symptoms of UTI. Patient moves all extremities, no facial droop, slurred speech. Of note, pt had hyponatremia and admitted to hospital in March. She had CT-chest which showed right lung mass. Family chose to not pursue any Traitement for lung mass.   ED Course: pt was found to have sodium 118, WBC 11.0, BNP 527, creatinine normal, temperature normal, no tachycardia, oxygen saturation 92-97% on room air, CT head is negative for acute intracranial abnormalities. CXR showed similar appearance of right pleural effusion, masslike appearance at the right lung base, and left pleural thickening with some nodularity. Pt is admitted to SDU as in pt.  Review of Systems:  Could not be reviewed with due to altered mental status.  Allergy: No Known Allergies  Past Medical History:  Diagnosis Date  . Arthritis    "arms" (10/13/2016)  . High cholesterol   . Hypertension   . Hyponatremia   . Stroke [facility](HCC) ~ 2015   "swallow difficulties since" (10/13/2016)    Past Surgical History:  Procedure Laterality Date  . ABDOMINAL HERNIA REPAIR    . CATARACT EXTRACTION Right   .  HERNIA REPAIR    . HIP ARTHROPLASTY Right 07/10/2014   Procedure: ARTHROPLASTY BIPOLAR HIP;  Surgeon: Cheral AlmasNaiping Michael Xu, MD;  [facility];  Service: Orthopedics;  Laterality: Right;  . JOINT REPLACEMENT      Social History:  reports that she has never smoked. She has never used smokeless tobacco. She reports that she does not drink alcohol or use drugs.  Family History:  Family History  Problem Relation Age of Onset  . Dementia Other   . CAD Neg Hx      Prior to Admission medications   Medication Sig Start Date End Date Taking? Authorizing Provider  acetaminophen (TYLENOL) 325 MG tablet Take 325 mg by mouth every 6 (six) hours as needed for mild pain, moderate pain or headache.    Yes [provider]  calcium-vitamin D (OSCAL WITH D) 500-200 MG-UNIT per tablet Take 1 tablet by mouth daily with breakfast. Patient taking differently: Take 1 tablet by mouth daily.  07/10/14  Yes Tarry KosXu, Naiping M, MD  carvedilol (COREG) 3.125 MG tablet Take 1 tablet (3.125 mg total) by mouth 2 (two) times daily with a meal. 02/11/17  Yes Leroy SeaSingh, Prashant K, MD  Docusate Calcium (STOOL SOFTENER PO) Take 1 capsule by mouth as needed (for constipation).   Yes [provider]  furosemide (LASIX) 20 MG tablet Take 1 tablet (20 mg total) by mouth daily. 02/11/17  Yes Leroy SeaSingh, Prashant K, MD  hydrALAZINE (APRESOLINE) 50 MG tablet Take 1.5 tablets (75 mg total) by mouth 3 (three) times daily. 12/26/14  Yes  Ames Dura, MD  loratadine (CLARITIN) 10 MG tablet Take 10 mg by mouth daily.   Yes [provider]  pantoprazole (PROTONIX) 40 MG tablet Take 1 tablet (40 mg total) by mouth daily. 02/22/17  Yes Arrien, York Ram, MD  Potassium Chloride ER 20 MEQ TBCR Take 20 mEq by mouth daily. 02/11/17  Yes Leroy Sea, MD    Physical Exam: Vitals:   05/03/17 2145 05/03/17 2230 05/03/17 2300 05/04/17 0000  BP: 103/61 134/72 105/61 (!) 171/90  Pulse: 66 65 63 78  Resp: (!) 22 11 11 12     Temp:    98.1 F (36.7 C)  TempSrc:    Axillary  SpO2: 97% 99% 97% 98%  Weight:    44.7 kg (98 lb 9.6 oz)  Height:    4\' 9"  (1.448 m)   General: Not in acute distress HEENT:       Eyes: PERRL, EOMI, no scleral icterus.       ENT: No discharge from the ears and nose, no pharynx injection, no tonsillar enlargement.        Neck: No JVD, no bruit, no mass felt. Heme: No neck lymph node enlargement. Cardiac: S1/S2, RRR, No murmurs, No gallops or rubs. Respiratory: No rales, wheezing, rhonchi or rubs. GI: Soft, nondistended, nontender, no rebound pain, no organomegaly, BS present. GU: No hematuria Ext: 1+ pitting leg edema bilaterally. 2+DP/PT pulse bilaterally. Musculoskeletal: No joint deformities, No joint redness or warmth, no limitation of ROM in spin. Skin: No rashes.  Neuro: confused, not oriented X3, cranial nerves II-XII grossly intact, moves all extremities normally. Psych: Patient is not psychotic, no suicidal or hemocidal ideation.  Labs on Admission: I have personally reviewed following labs and imaging studies  CBC:  Recent Labs Lab 05/03/17 1814  WBC 11.0*  HGB 13.1  HCT 37.8  MCV 76.5*  PLT 350   Basic Metabolic Panel:  Recent Labs Lab 05/03/17 1814  NA 118*  K 3.7  CL 81*  CO2 28  GLUCOSE 106*  BUN 8  CREATININE 0.81  CALCIUM 9.1   GFR: Estimated Creatinine Clearance: 25.3 mL/min (by C-G formula based on SCr of 0.81 mg/dL). Liver Function Tests: No results for input(s): AST, ALT, ALKPHOS, BILITOT, PROT, ALBUMIN in the last 168 hours. No results for input(s): LIPASE, AMYLASE in the last 168 hours. No results for input(s): AMMONIA in the last 168 hours. Coagulation Profile: No results for input(s): INR, PROTIME in the last 168 hours. Cardiac Enzymes: No results for input(s): CKTOTAL, CKMB, CKMBINDEX, TROPONINI in the last 168 hours. BNP (last 3 results) No results for input(s): PROBNP in the last 8760 hours. HbA1C: No results for input(s):  HGBA1C in the last 72 hours. CBG:  Recent Labs Lab 05/03/17 1833  GLUCAP 96   Lipid Profile: No results for input(s): CHOL, HDL, LDLCALC, TRIG, CHOLHDL, LDLDIRECT in the last 72 hours. Thyroid Function Tests:  Recent Labs  05/04/17 0044  TSH 2.935   Anemia Panel: No results for input(s): VITAMINB12, FOLATE, FERRITIN, TIBC, IRON, RETICCTPCT in the last 72 hours. Urine analysis:    Component Value Date/Time   COLORURINE YELLOW 02/17/2017 2200   APPEARANCEUR CLEAR 02/17/2017 2200   LABSPEC 1.005 02/17/2017 2200   PHURINE 7.0 02/17/2017 2200   GLUCOSEU NEGATIVE 02/17/2017 2200   HGBUR NEGATIVE 02/17/2017 2200   BILIRUBINUR NEGATIVE 02/17/2017 2200   KETONESUR 5 (A) 02/17/2017 2200   PROTEINUR NEGATIVE 02/17/2017 2200   UROBILINOGEN 0.2 12/22/2014 0903   NITRITE NEGATIVE 02/17/2017  2200   LEUKOCYTESUR NEGATIVE 02/17/2017 2200   Sepsis Labs: @LABRCNTIP (procalcitonin:4,lacticidven:4) )No results found for this or any previous visit (from the past 240 hour(s)).   Radiological Exams on Admission: Dg Chest 2 View  Result Date: 05/03/2017 CLINICAL DATA:  Images obtained for confusion. Unable to obtain patient hx due to patient confusion and not being able to speak. EXAM: CHEST  2 VIEW COMPARISON:  02/17/2017 FINDINGS: Atherosclerotic aortic arch. Mild left perihilar nodularity similar to prior. Pleural thickening laterally in the left mid hemithorax. Blunting of the right costophrenic angle with nodularity along the right hemidiaphragm. Moderate enlargement of the cardiopericardial silhouette. Bony demineralization and thoracic spondylosis. Abdominal aortic atherosclerotic calcification. IMPRESSION: 1. Similar appearance of right pleural effusion, masslike appearance at the right lung base, and left pleural thickening with some nodularity. These findings were discussed in greater detail in the March 2018 CT scan as representing a combination of pleural plaques and possible right  basilar mass. 2. Stable moderate cardiomegaly, without overt edema. 3.  Aortic Atherosclerosis (ICD10-I70.0). Electronically Signed   By: Gaylyn Rong M.D.   On: 05/03/2017 20:04   Ct Head Wo Contrast  Result Date: 05/03/2017 CLINICAL DATA:  Confusion and somnolence. EXAM: CT HEAD WITHOUT CONTRAST TECHNIQUE: Contiguous axial images were obtained from the base of the skull through the vertex without intravenous contrast. COMPARISON:  MRI brain 02/18/2017 FINDINGS: Brain: Calcifications in the globus pallidus nuclei bilaterally, likely physiologic, and unchanged from prior. Periventricular white matter and corona radiata hypodensities favor chronic ischemic microvascular white matter disease. Otherwise, the brainstem, cerebellum, cerebral peduncles, thalami, basal ganglia, basilar cisterns, and ventricular system appear within normal limits. No intracranial hemorrhage, mass lesion, or acute CVA. Vascular: Cavernous carotid and vertebral artery atherosclerotic calcification observed. Skull: The odontoid extends 3 mm above the McRae line, compatible with basilar invagination. There may be some stenosis at the foramen magnum resulting. There is also calcification of the transverse ligament at C1-2. Sinuses/Orbits: Mild chronic ethmoid sinusitis. Other: No supplemental non-categorized findings. IMPRESSION: 1. No acute intracranial findings. 2. Periventricular white matter and corona radiata hypodensities favor chronic ischemic microvascular white matter disease. 3. 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 4. Atherosclerosis of the cavernous carotid arteries and vertebral arteries. Electronically Signed   By: Gaylyn Rong M.D.   On: 05/03/2017 20:24     EKG: Independently reviewed. Sinus rhythm, QTC 544, LAD, biphasicular block, mild T-wave inversion in lateral leads   Assessment/Plan Principal Problem:   Hyponatremia Active Problems:   HTN (hypertension)    Acute metabolic encephalopathy   Acute on chronic combined systolic and diastolic CHF (congestive heart failure) (HCC)   GERD (gastroesophageal reflux disease)   Mass of right lung   Hyponatremia:  Likely multifactorial etiology, including decreased oral intake, Lasix use and possible SIADH given lung mass. Pt has leg edema, indicating hypervolemic hyponatremia. Pt has AMS. Ideal treatment would be with central line placement plus hypertonic saline and IV Lasix. I discussed with patient's family, they would like patient to be treated gently and non-invasively.   -will place on SDU for obs - Will check urine sodium, urine osmolality, serum osmolality. - check TSH - Fluid restriction - IVF: 500 cc of NS in ED, will continue with IV normal saline at 50 mL/h - check BMP q6h  Acute metabolic encephalopathy: likely due to hyponatremia. CT head is negative for acute intracranial abnormalities. -correct hyponatremia as above -check UA and urine culture -Frequent neuro check  HTN (hypertension): -Continue Coreg, oral  hydralazine -IV hydralazine when necessary  Acute on chronic combined systolic and diastolic CHF (congestive heart failure) (HCC): 2-D echo on 02/18/17 showed EF 25-30 percent with grade 1 diastolic dysfunction. Patient has a bilateral leg edema, and elevated BNP 527, indicating CHF exacerbation. Patient does not have acute respiratory distress. -Hold Lasix due to hyponatremia -Continue Coreg  GERD: -Protonix  Mass of right lung: possible malignancy. Family chose to not to treat. -No further workup needed.   DVT ppx: SQ Lovenox Code Status: DNR (I discussed with patient's daughter, granddaughter and granddaughter in law, explained the meaning of CODE STATUS. Per her family, patient would want to be DNR) Family Communication: Yes, patient's daughter, granddaughter and granddaughter in law at bed side Disposition Plan:  Anticipate discharge back to previous home  environment Consults called:  none Admission status: Obs / tele    Date of Service 05/04/2017    Lorretta Harp [facility] Pager [phone]  If 7PM-7AM, please contact night-coverage [email or web address] Password [email or web address] 05/04/2017, 4:09 AM

## 2017-05-03 NOTE — ED Notes (Signed)
Pt lethargic and unable to ambulate to the bed. This RN picked up patient and placed her in the bed.

## 2017-05-04 ENCOUNTER — Encounter (HOSPITAL_COMMUNITY): Payer: Self-pay

## 2017-05-04 DIAGNOSIS — E871 Hypo-osmolality and hyponatremia: Secondary | ICD-10-CM | POA: Diagnosis not present

## 2017-05-04 DIAGNOSIS — G9341 Metabolic encephalopathy: Secondary | ICD-10-CM

## 2017-05-04 DIAGNOSIS — I10 Essential (primary) hypertension: Secondary | ICD-10-CM | POA: Diagnosis not present

## 2017-05-04 DIAGNOSIS — R918 Other nonspecific abnormal finding of lung field: Secondary | ICD-10-CM | POA: Diagnosis not present

## 2017-05-04 DIAGNOSIS — I5043 Acute on chronic combined systolic (congestive) and diastolic (congestive) heart failure: Secondary | ICD-10-CM | POA: Diagnosis not present

## 2017-05-04 LAB — CBC
HCT: 35.2 % — ABNORMAL LOW (ref 36.0–46.0)
Hemoglobin: 12.1 g/dL (ref 12.0–15.0)
MCH: 26.7 pg (ref 26.0–34.0)
MCHC: 34.4 g/dL (ref 30.0–36.0)
MCV: 77.7 fL — AB (ref 78.0–100.0)
Platelets: 298 10*3/uL (ref 150–400)
RBC: 4.53 MIL/uL (ref 3.87–5.11)
RDW: 14.6 % (ref 11.5–15.5)
WBC: 11.4 10*3/uL — ABNORMAL HIGH (ref 4.0–10.5)

## 2017-05-04 LAB — GLUCOSE, CAPILLARY
GLUCOSE-CAPILLARY: 79 mg/dL (ref 65–99)
Glucose-Capillary: 82 mg/dL (ref 65–99)

## 2017-05-04 LAB — BASIC METABOLIC PANEL
ANION GAP: 12 (ref 5–15)
ANION GAP: 9 (ref 5–15)
ANION GAP: 7 (ref 5–15)
BUN: 6 mg/dL (ref 6–20)
BUN: 7 mg/dL (ref 6–20)
BUN: 8 mg/dL (ref 6–20)
CALCIUM: 8.5 mg/dL — AB (ref 8.9–10.3)
CHLORIDE: 85 mmol/L — AB (ref 101–111)
CHLORIDE: 88 mmol/L — AB (ref 101–111)
CO2: 24 mmol/L (ref 22–32)
CO2: 24 mmol/L (ref 22–32)
CO2: 26 mmol/L (ref 22–32)
CREATININE: 0.72 mg/dL (ref 0.44–1.00)
Calcium: 8.4 mg/dL — ABNORMAL LOW (ref 8.9–10.3)
Calcium: 8.6 mg/dL — ABNORMAL LOW (ref 8.9–10.3)
Chloride: 87 mmol/L — ABNORMAL LOW (ref 101–111)
Creatinine, Ser: 0.73 mg/dL (ref 0.44–1.00)
Creatinine, Ser: 0.77 mg/dL (ref 0.44–1.00)
GFR calc Af Amer: >60 mL/min (ref >60)
GFR calc non Af Amer: >60 mL/min (ref >60)
GFR calc non Af Amer: >60 mL/min (ref >60)
GFR calc non Af Amer: >60 mL/min (ref >60)
GLUCOSE: 90 mg/dL (ref 65–99)
Glucose, Bld: 71 mg/dL (ref 65–99)
Glucose, Bld: 77 mg/dL (ref 65–99)
POTASSIUM: 3.6 mmol/L (ref 3.5–5.1)
Potassium: 3.4 mmol/L — ABNORMAL LOW (ref 3.5–5.1)
Potassium: 3.4 mmol/L — ABNORMAL LOW (ref 3.5–5.1)
Sodium: 121 mmol/L — ABNORMAL LOW (ref 135–145)
Sodium: 121 mmol/L — ABNORMAL LOW (ref 135–145)
Sodium: 120 mmol/L — ABNORMAL LOW (ref 135–145)

## 2017-05-04 LAB — URINALYSIS, ROUTINE W REFLEX MICROSCOPIC
Bilirubin Urine: NEGATIVE
GLUCOSE, UA: NEGATIVE mg/dL
Hgb urine dipstick: NEGATIVE
KETONES UR: 5 mg/dL — AB
LEUKOCYTES UA: NEGATIVE
NITRITE: NEGATIVE
PH: 6 (ref 5.0–8.0)
Protein, ur: NEGATIVE mg/dL
SPECIFIC GRAVITY, URINE: 1.01 (ref 1.005–1.030)

## 2017-05-04 LAB — SODIUM, URINE, RANDOM: SODIUM UR: 26 mmol/L

## 2017-05-04 LAB — TSH: TSH: 2.935 u[IU]/mL (ref 0.350–4.500)

## 2017-05-04 LAB — OSMOLALITY, URINE: Osmolality, Ur: 323 mOsm/kg (ref 300–900)

## 2017-05-04 LAB — OSMOLALITY: Osmolality: 253 mOsm/kg — ABNORMAL LOW (ref 275–295)

## 2017-05-04 LAB — MRSA PCR SCREENING: MRSA by PCR: NEGATIVE

## 2017-05-04 MED ORDER — ENOXAPARIN SODIUM 30 MG/0.3ML ~~LOC~~ SOLN
30.0000 mg | Freq: Every day | SUBCUTANEOUS | Status: DC
  Administered 2017-05-04 – 2017-05-06 (×3): 30 mg via SUBCUTANEOUS
  Filled 2017-05-04 (×3): qty 0.3

## 2017-05-04 NOTE — Progress Notes (Addendum)
PROGRESS NOTE    [name] Fry  ZOX:[id number] DOB: September 24, 1920 DOA: 05/03/2017 PCP: Wilmer Floor., MD  Brief Narrative:[name] Fry is a 83 y.o. female with medical history significant of hypertension, hyperlipidemia, GERD, stroke, arthritis, CHF with EF 25-30%, upper GI bleeding, hyponatremia, lung mass, who presents with generalized weakness, lethargy and AMS. pt had hyponatremia and admitted to hospital in March. She had CT-chest which showed right lung mass. Family chose to not pursue any workup for lung mass.  In ED pt was found to have sodium 118, UA pending   Assessment & Plan:   Principal Problem:   Hyponatremia -improving -suspect multifactorial -likely due to lasix and SIADH from lung mass -baseline Na around 125 -await Urine studies -on gentle IVF, NS at 50cc/hr and NA improving to 121 this am -continue for today  Metabolic encephalopathy -improving -due to #1 and suspect some senile dementia -await urine studies too -CT head unremarkable   HTN (hypertension) -continue coreg, hydralazine  Chronic combined systolic and diastolic CHF (congestive heart failure) (HCC) -ECHO 3/18 with EF 25% and grade 1DD -lasix on hold -continue coreg  Mass of right lung -possibly malignant -no further workup or Rx planned per Family, which is appropriate at her age  DVT prophylaxis:lovenox Code Status: DNR Family Communication: None at bedside Disposition Plan: Home in 1-2days  Subjective: Feels ok, no complaints, no chest pain, N/V, eating some  Objective: Vitals:   05/04/17 0500 05/04/17 0600 05/04/17 0822 05/04/17 1025  BP: (!) 152/85 137/80 (!) 143/68 (!) 143/72  Pulse: 76 71 65   Resp: (!) 22 16 13    Temp:  98.9 F (37.2 C) 97.6 F (36.4 C)   TempSrc:  Oral Oral   SpO2: 98% 99% 100%   Weight: 44.7 kg (98 lb 8.7 oz)     Height:        Intake/Output Summary (Last 24 hours) at 05/04/17 1113 Last data filed at 05/04/17 0400  Gross per 24 hour  Intake             192.5 ml  Output                0 ml  Net            192.5 ml   Filed Weights   05/04/17 0000 05/04/17 0500  Weight: 44.7 kg (98 lb 9.6 oz) 44.7 kg (98 lb 8.7 oz)    Examination:  General exam: Frail elderly, cachectic, appears calm and comfortable  Respiratory system: Clear to auscultation. Respiratory effort normal. Cardiovascular system: S1 & S2 heard, RRR. No JVD, murmurs Gastrointestinal system: Abdomen is nondistended, soft and nontender. Normal bowel sounds heard. Central nervous system: Alert and oriented. Moves all extremities Extremities: Symmetric 5 x 5 power. Skin: No rashes, lesions or ulcers Psychiatry: flat affect    Data Reviewed:   CBC:  Recent Labs Lab 05/03/17 1814 05/04/17 0359  WBC 11.0* 11.4*  HGB 13.1 12.1  HCT 37.8 35.2*  MCV 76.5* 77.7*  PLT 350 298   Basic Metabolic Panel:  Recent Labs Lab 05/03/17 1814 05/04/17 0359  NA 118* 121*  K 3.7 3.4*  CL 81* 85*  CO2 28 24  GLUCOSE 106* 71  BUN 8 6  CREATININE 0.81 0.72  CALCIUM 9.1 8.4*   GFR: Estimated Creatinine Clearance: 25.6 mL/min (by C-G formula based on SCr of 0.72 mg/dL). Liver Function Tests: No results for input(s): AST, ALT, ALKPHOS, BILITOT, PROT, ALBUMIN in the last 168 hours. No  results for input(s): LIPASE, AMYLASE in the last 168 hours. No results for input(s): AMMONIA in the last 168 hours. Coagulation Profile: No results for input(s): INR, PROTIME in the last 168 hours. Cardiac Enzymes: No results for input(s): CKTOTAL, CKMB, CKMBINDEX, TROPONINI in the last 168 hours. BNP (last 3 results) No results for input(s): PROBNP in the last 8760 hours. HbA1C: No results for input(s): HGBA1C in the last 72 hours. CBG:  Recent Labs Lab 05/03/17 1833 05/04/17 0558 05/04/17 0802  GLUCAP 96 79 82   Lipid Profile: No results for input(s): CHOL, HDL, LDLCALC, TRIG, CHOLHDL, LDLDIRECT in the last 72 hours. Thyroid Function Tests:  Recent Labs   05/04/17 0044  TSH 2.935   Anemia Panel: No results for input(s): VITAMINB12, FOLATE, FERRITIN, TIBC, IRON, RETICCTPCT in the last 72 hours. Urine analysis:    Component Value Date/Time   COLORURINE YELLOW 02/17/2017 2200   APPEARANCEUR CLEAR 02/17/2017 2200   LABSPEC 1.005 02/17/2017 2200   PHURINE 7.0 02/17/2017 2200   GLUCOSEU NEGATIVE 02/17/2017 2200   HGBUR NEGATIVE 02/17/2017 2200   BILIRUBINUR NEGATIVE 02/17/2017 2200   KETONESUR 5 (A) 02/17/2017 2200   PROTEINUR NEGATIVE 02/17/2017 2200   UROBILINOGEN 0.2 12/22/2014 0903   NITRITE NEGATIVE 02/17/2017 2200   LEUKOCYTESUR NEGATIVE 02/17/2017 2200   Sepsis Labs: @LABRCNTIP (procalcitonin:4,lacticidven:4)  ) Recent Results (from the past 240 hour(s))  MRSA PCR Screening     Status: None   Collection Time: 05/04/17 12:04 AM  Result Value Ref Range Status   MRSA by PCR NEGATIVE NEGATIVE Final    Comment:        The GeneXpert MRSA Assay (FDA approved for NASAL specimens only), is one component of a comprehensive MRSA colonization surveillance program. It is not intended to diagnose MRSA infection nor to guide or monitor treatment for MRSA infections.          Radiology Studies: Dg Chest 2 View  Result Date: 05/03/2017 CLINICAL DATA:  Images obtained for confusion. Unable to obtain patient hx due to patient confusion and not being able to speak. EXAM: CHEST  2 VIEW COMPARISON:  02/17/2017 FINDINGS: Atherosclerotic aortic arch. Mild left perihilar nodularity similar to prior. Pleural thickening laterally in the left mid hemithorax. Blunting of the right costophrenic angle with nodularity along the right hemidiaphragm. Moderate enlargement of the cardiopericardial silhouette. Bony demineralization and thoracic spondylosis. Abdominal aortic atherosclerotic calcification. IMPRESSION: 1. Similar appearance of right pleural effusion, masslike appearance at the right lung base, and left pleural thickening with some  nodularity. These findings were discussed in greater detail in the March 2018 CT scan as representing a combination of pleural plaques and possible right basilar mass. 2. Stable moderate cardiomegaly, without overt edema. 3.  Aortic Atherosclerosis (ICD10-I70.0). Electronically Signed   By: Gaylyn RongWalter  Liebkemann M.D.   On: 05/03/2017 20:04   Ct Head Wo Contrast  Result Date: 05/03/2017 CLINICAL DATA:  Confusion and somnolence. EXAM: CT HEAD WITHOUT CONTRAST TECHNIQUE: Contiguous axial images were obtained from the base of the skull through the vertex without intravenous contrast. COMPARISON:  MRI brain 02/18/2017 FINDINGS: Brain: Calcifications in the globus pallidus nuclei bilaterally, likely physiologic, and unchanged from prior. Periventricular white matter and corona radiata hypodensities favor chronic ischemic microvascular white matter disease. Otherwise, the brainstem, cerebellum, cerebral peduncles, thalami, basal ganglia, basilar cisterns, and ventricular system appear within normal limits. No intracranial hemorrhage, mass lesion, or acute CVA. Vascular: Cavernous carotid and vertebral artery atherosclerotic calcification observed. Skull: The odontoid extends 3 mm above the [facility]  line, compatible with basilar invagination. There may be some stenosis at the foramen magnum resulting. There is also calcification of the transverse ligament at C1-2. Sinuses/Orbits: Mild chronic ethmoid sinusitis. Other: No supplemental non-categorized findings. IMPRESSION: 1. No acute intracranial findings. 2. Periventricular white matter and corona radiata hypodensities favor chronic ischemic microvascular white matter disease. 3. 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 4. Atherosclerosis of the cavernous carotid arteries and vertebral arteries. Electronically Signed   By: Gaylyn Rong M.D.   On: 05/03/2017 20:24        Scheduled Meds: . calcium-vitamin D  1 tablet Oral  Daily  . carvedilol  3.125 mg Oral BID WC  . enoxaparin (LOVENOX) injection  30 mg Subcutaneous QHS  . hydrALAZINE  75 mg Oral TID  . pantoprazole  40 mg Oral Daily  . sodium chloride flush  3 mL Intravenous Q12H   Continuous Infusions: . sodium chloride 50 mL/hr at 05/04/17 0009     LOS: 0 days    Time spent:    Zannie Cove, MD [facility] Pager [phone]  If 7PM-7AM, please contact night-coverage [email or web address] Password [email or web address] 05/04/2017, 11:13 AM

## 2017-05-05 DIAGNOSIS — I5042 Chronic combined systolic (congestive) and diastolic (congestive) heart failure: Secondary | ICD-10-CM | POA: Diagnosis not present

## 2017-05-05 DIAGNOSIS — E860 Dehydration: Secondary | ICD-10-CM | POA: Diagnosis not present

## 2017-05-05 DIAGNOSIS — Z8673 Personal history of transient ischemic attack (TIA), and cerebral infarction without residual deficits: Secondary | ICD-10-CM | POA: Diagnosis not present

## 2017-05-05 DIAGNOSIS — I5043 Acute on chronic combined systolic (congestive) and diastolic (congestive) heart failure: Secondary | ICD-10-CM | POA: Diagnosis not present

## 2017-05-05 DIAGNOSIS — Z66 Do not resuscitate: Secondary | ICD-10-CM | POA: Diagnosis not present

## 2017-05-05 DIAGNOSIS — G9341 Metabolic encephalopathy: Secondary | ICD-10-CM | POA: Diagnosis not present

## 2017-05-05 DIAGNOSIS — K219 Gastro-esophageal reflux disease without esophagitis: Secondary | ICD-10-CM | POA: Diagnosis not present

## 2017-05-05 DIAGNOSIS — E7800 Pure hypercholesterolemia, unspecified: Secondary | ICD-10-CM | POA: Diagnosis not present

## 2017-05-05 DIAGNOSIS — E222 Syndrome of inappropriate secretion of antidiuretic hormone: Secondary | ICD-10-CM | POA: Diagnosis not present

## 2017-05-05 DIAGNOSIS — E871 Hypo-osmolality and hyponatremia: Secondary | ICD-10-CM | POA: Diagnosis not present

## 2017-05-05 DIAGNOSIS — E43 Unspecified severe protein-calorie malnutrition: Secondary | ICD-10-CM | POA: Diagnosis not present

## 2017-05-05 DIAGNOSIS — E785 Hyperlipidemia, unspecified: Secondary | ICD-10-CM | POA: Diagnosis not present

## 2017-05-05 DIAGNOSIS — F0390 Unspecified dementia without behavioral disturbance: Secondary | ICD-10-CM | POA: Diagnosis not present

## 2017-05-05 DIAGNOSIS — I429 Cardiomyopathy, unspecified: Secondary | ICD-10-CM | POA: Diagnosis not present

## 2017-05-05 DIAGNOSIS — T501X5A Adverse effect of loop [high-ceiling] diuretics, initial encounter: Secondary | ICD-10-CM | POA: Diagnosis not present

## 2017-05-05 DIAGNOSIS — Z6820 Body mass index (BMI) 20.0-20.9, adult: Secondary | ICD-10-CM | POA: Diagnosis not present

## 2017-05-05 DIAGNOSIS — Z96641 Presence of right artificial hip joint: Secondary | ICD-10-CM | POA: Diagnosis not present

## 2017-05-05 DIAGNOSIS — R918 Other nonspecific abnormal finding of lung field: Secondary | ICD-10-CM | POA: Diagnosis not present

## 2017-05-05 DIAGNOSIS — I10 Essential (primary) hypertension: Secondary | ICD-10-CM | POA: Diagnosis not present

## 2017-05-05 DIAGNOSIS — I110 Hypertensive heart disease with heart failure: Secondary | ICD-10-CM | POA: Diagnosis not present

## 2017-05-05 DIAGNOSIS — Z79899 Other long term (current) drug therapy: Secondary | ICD-10-CM | POA: Diagnosis not present

## 2017-05-05 LAB — BASIC METABOLIC PANEL
ANION GAP: 9 (ref 5–15)
BUN: 8 mg/dL (ref 6–20)
CHLORIDE: 88 mmol/L — AB (ref 101–111)
CO2: 24 mmol/L (ref 22–32)
Calcium: 8.5 mg/dL — ABNORMAL LOW (ref 8.9–10.3)
Creatinine, Ser: 0.78 mg/dL (ref 0.44–1.00)
GFR calc Af Amer: >60 mL/min (ref >60)
GFR calc non Af Amer: >60 mL/min (ref >60)
GLUCOSE: 75 mg/dL (ref 65–99)
POTASSIUM: 3.2 mmol/L — AB (ref 3.5–5.1)
Sodium: 121 mmol/L — ABNORMAL LOW (ref 135–145)

## 2017-05-05 LAB — CBC
HEMATOCRIT: 36.2 % (ref 36.0–46.0)
HEMOGLOBIN: 12.5 g/dL (ref 12.0–15.0)
MCH: 27.1 pg (ref 26.0–34.0)
MCHC: 34.5 g/dL (ref 30.0–36.0)
MCV: 78.5 fL (ref 78.0–100.0)
Platelets: 317 10*3/uL (ref 150–400)
RBC: 4.61 MIL/uL (ref 3.87–5.11)
RDW: 15.3 % (ref 11.5–15.5)
WBC: 9.4 10*3/uL (ref 4.0–10.5)

## 2017-05-05 LAB — URINE CULTURE: Culture: NO GROWTH

## 2017-05-05 LAB — GLUCOSE, CAPILLARY: GLUCOSE-CAPILLARY: 92 mg/dL (ref 65–99)

## 2017-05-05 MED ORDER — FUROSEMIDE 20 MG PO TABS
20.0000 mg | ORAL_TABLET | Freq: Every day | ORAL | Status: DC
  Administered 2017-05-05 – 2017-05-07 (×3): 20 mg via ORAL
  Filled 2017-05-05 (×3): qty 1

## 2017-05-05 MED ORDER — POTASSIUM CHLORIDE CRYS ER 20 MEQ PO TBCR
40.0000 meq | EXTENDED_RELEASE_TABLET | Freq: Two times a day (BID) | ORAL | Status: DC
  Administered 2017-05-05 – 2017-05-07 (×4): 40 meq via ORAL
  Filled 2017-05-05 (×5): qty 2

## 2017-05-05 NOTE — Evaluation (Addendum)
Physical Therapy Evaluation Patient Details Name: [name] Fry MRN: [id number] DOB: 1920/06/02 Today's Date: 05/05/2017   History of Present Illness  Pt adm with hyponatremia. PMH - TIA, THR on rt, CVA, chf  Clinical Impression  Pt admitted with above diagnosis and presents to PT with functional limitations due to deficits listed below (See PT problem list). Pt needs skilled PT to maximize independence and safety to allow discharge to home with continued family support. Expect pt will continue to make slow progress toward return to baseline.     Follow Up Recommendations Home health PT (if family agreeable )    Equipment Recommendations  None recommended by PT    Recommendations for Other Services       Precautions / Restrictions Precautions Precautions: Fall Restrictions Weight Bearing Restrictions: No      Mobility  Bed Mobility Overal bed mobility: Needs Assistance Bed Mobility: Supine to Sit;Sit to Supine     Supine to sit: Min assist Sit to supine: Min assist   General bed mobility comments: Assist to elevate trunk into sitting, bring hips to EOB, and to bring legs back up into bed when returning to supine.  Transfers Overall transfer level: Needs assistance Equipment used: Rolling walker (2 wheeled) Transfers: Sit to/from Stand Sit to Stand: Min assist         General transfer comment: Assist to bring hips ;up and for balance.  Ambulation/Gait Ambulation/Gait assistance: Min assist Ambulation Distance (Feet): 30 Feet Assistive device: Rolling walker (2 wheeled) Gait Pattern/deviations: Step-through pattern;Decreased step length - right;Decreased step length - left;Shuffle;Trunk flexed Gait velocity: decr Gait velocity interpretation: <1.8 ft/sec, indicative of risk for recurrent falls General Gait Details: Assist for balance and support  Stairs            Wheelchair Mobility    Modified Rankin (Stroke Patients Only)       Balance  Overall balance assessment: Needs assistance Sitting-balance support: No upper extremity supported;Feet supported Sitting balance-Leahy Scale: Fair     Standing balance support: Bilateral upper extremity supported Standing balance-Leahy Scale: Poor Standing balance comment: walker and min assist for static standing                             Pertinent Vitals/Pain Pain Assessment: No/denies pain    Home Living Family/patient expects to be discharged to:: Private residence Living Arrangements: Children;Other relatives Available Help at Discharge: Family;Available 24 hours/day Type of Home: House Home Access: Stairs to enter Entrance Stairs-Rails: None Entrance Stairs-Number of Steps: 2 Home Layout: One level Home Equipment: Walker - 2 wheels;Bedside commode;Shower seat      Prior Function Level of Independence: Needs assistance   Gait / Transfers Assistance Needed: amb with RW  ADL's / Homemaking Assistance Needed: Family does all IADLs and drives.   Comments: Per prior admission     Hand Dominance   Dominant Hand: Right    Extremity/Trunk Assessment   Upper Extremity Assessment Upper Extremity Assessment: Defer to OT evaluation    Lower Extremity Assessment Lower Extremity Assessment: Generalized weakness       Communication   Communication: HOH  Cognition Arousal/Alertness: Awake/alert Behavior During Therapy: WFL for tasks assessed/performed Overall Cognitive Status: No family/caregiver present to determine baseline cognitive functioning  General Comments      Exercises     Assessment/Plan    PT Assessment Patient needs continued PT services  PT Problem List Decreased strength;Decreased range of motion;Decreased balance;Decreased mobility       PT Treatment Interventions DME instruction;Gait training;Functional mobility training;Therapeutic activities;Therapeutic exercise;Balance  training;Patient/family education    PT Goals (Current goals can be found in the Care Plan section)  Acute Rehab PT Goals Patient Stated Goal: not stated PT Goal Formulation: With patient Time For Goal Achievement: 05/12/17 Potential to Achieve Goals: Good    Frequency Min 3X/week   Barriers to discharge        Co-evaluation               AM-PAC PT "6 Clicks" Daily Activity  Outcome Measure Difficulty turning over in bed (including adjusting bedclothes, sheets and blankets)?: Total Difficulty moving from lying on back to sitting on the side of the bed? : Total Difficulty sitting down on and standing up from a chair with arms (e.g., wheelchair, bedside commode, etc,.)?: Total Help needed moving to and from a bed to chair (including a wheelchair)?: A Little Help needed walking in hospital room?: A Little Help needed climbing 3-5 steps with a railing? : A Lot 6 Click Score: 11    End of Session Equipment Utilized During Treatment: Gait belt Activity Tolerance: Patient tolerated treatment well Patient left: in bed;with call bell/phone within reach;with nursing/sitter in room Nurse Communication: Mobility status PT Visit Diagnosis: Unsteadiness on feet (R26.81);Muscle weakness (generalized) (M62.81);Difficulty in walking, not elsewhere classified (R26.2)    Time: [id number] PT Time Calculation (min) (ACUTE ONLY): 15 min   Charges:   PT Evaluation $PT Eval Moderate Complexity: 1 Procedure     PT G Codes:   PT G-Codes **NOT FOR INPATIENT CLASS** Functional Assessment Tool Used: AM-PAC 6 Clicks Basic Mobility Functional Limitation: Mobility: Walking and moving around Mobility: Walking and Moving Around Current Status ([id number]([id number]): At least 60 percent but less than 80 percent impaired, limited or restricted Mobility: Walking and Moving Around Goal Status [phone]([id number]): At least 40 percent but less than 60 percent impaired, limited or restricted    [facility] Ailyn Gladd  PT [phone]   Angelina OkCary W Winifred Masterson [facility]ok 05/05/2017, 2:31 PM

## 2017-05-05 NOTE — Progress Notes (Addendum)
[name] Fry is a 83 y.o. female patient was transfered from [address] awake, alert - oriented  X 2 - no acute distress noted.  VSS - Blood pressure (!) 158/64, pulse 74, temperature 97.9 F (36.6 C), temperature source Oral, resp. rate 19, height 4\' 9"  (1.448 m), weight 42 kg (92 lb 8 oz), SpO2 100 %.    IV in place, occlusive dsg intact without redness.  Orientation to room, and floor completed with information packet given to patient/family.  Patient declined safety video at this time.  Admission INP armband ID verified with patient/family, and in place.   SR up x 2, fall assessment complete, with patient and family able to verbalize understanding of risk associated with falls, and verbalized understanding to call nsg before up out of bed.  Call light within reach, patient able to voice, and demonstrate understanding.  Skin, clean-dry- intact with evidence of scatter  bruising, but  no evidence of skin break down noted on exam.  Will continue to eval and treat per MD orders.  Melvenia NeedlesIreti O Kloe Oates, RN 05/05/2017 4:07 PM

## 2017-05-05 NOTE — Progress Notes (Signed)
Patient being transferred to 5W. Called patient's daughter Leonette MonarchGwendolyn Shurtz to notify of tranfer/room assignment.

## 2017-05-05 NOTE — Care Management Note (Addendum)
Case Management Note  Patient Details  Name: [name] Fry MRN: [id number] Date of Birth: 05/22/1920  Subjective/Objective:    From home with daughter, presents with hyponatremia, 118 now 121, met encephalopathy, htn, , mass of right lung she  received ivf's.  She is chronically on diuretics, per MD note will stop ivf and resume lasix and fluid restriction.  PCP Jenean Lindau             Action/Plan: NCM will follow for dc needs.   Expected Discharge Date:                  Expected Discharge Plan:     In-House Referral:     Discharge planning Services  CM Consult  Post Acute Care Choice:    Choice offered to:     DME Arranged:    DME Agency:     HH Arranged:    HH Agency:     Status of Service:  In process, will continue to follow  If discussed at Long Length of Stay Meetings, dates discussed:    Additional Comments:  Zenon Mayo, RN 05/05/2017, 12:27 PM

## 2017-05-05 NOTE — Progress Notes (Signed)
Patient transferring to 5W. Report called to receiving nurse. All questions answered. Patient's personal belongings transferred with her. Patient wearing glasses. Glass case in bag.

## 2017-05-05 NOTE — Progress Notes (Signed)
PROGRESS NOTE    [name] Fry  ZOX:[id number] DOB: 07-29-1920 DOA: 05/03/2017 PCP: Wilmer Floor., MD  Brief Narrative:[name] Fry is a 83 y.o. female with medical history significant of hypertension, hyperlipidemia, GERD, stroke, arthritis, CHF with EF 25-30%, upper GI bleeding, hyponatremia, lung mass, who presents with generalized weakness, lethargy and AMS. pt had hyponatremia and admitted to hospital in March. She had CT-chest which showed right lung mass. Family chose to not pursue any workup for lung mass.  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 -baseline Na around 125 -Urine studies of limited value since she is chronically on diuretics -I think she is adequately hydrated, will stop IV fluids, resume low-dose by mouth Lasix and fluid restriction  Metabolic encephalopathy -improving, mentation back to baseline -due to #1 and suspect some senile dementia -CT head unremarkable   HTN (hypertension) -continue coreg, hydralazine  Chronic combined systolic and diastolic CHF (congestive heart failure) (HCC) -ECHO 3/18 with EF 25% and grade 1DD -We will resume oral Lasix -continue coreg  Mass of right lung -possibly malignant -no further workup or Rx planned per Family, which is appropriate given her age and comorbidities  DVT prophylaxis:lovenox Code Status: DNR Family Communication: None at bedside, called and discussed with the daughter [name] Fry Disposition Plan: Transfer to stepdown telemetry floor today Home in 1-2days  Subjective: Reports feeling better today, mind is clear, no nausea vomiting no chest pain or dyspnea  Objective: Vitals:   05/05/17 0400 05/05/17 0713 05/05/17 0746 05/05/17 0913  BP:   (!) 153/73  (!) 158/72  Pulse:  74 68   Resp:  16    Temp: 97.4 F (36.3 C) 97.4 F (36.3 C)    TempSrc: Oral Oral    SpO2:  99%    Weight:      Height: 4\' 9"  (1.448 m)       Intake/Output Summary (Last 24 hours) at 05/05/17 1004 Last data filed at 05/05/17 0918  Gross per 24 hour  Intake             1123 ml  Output              150 ml  Net              973 ml   Filed Weights   05/04/17 0000 05/04/17 0500  Weight: 44.7 kg (98 lb 9.6 oz) 44.7 kg (98 lb 8.7 oz)    Examination: Gen: Elderly, frail female, laying in bed, no distress ,Awake, Alert, Oriented X 2  HEENT: PERRLA, Neck supple, no JVD Lungs: Good air movement bilaterally, CTAB CVS: RRR,No Gallops,Rubs or new Murmurs Abd: soft, Non tender, non distended, BS present Extremities: No Cyanosis, Clubbing or edema Skin: no new rashes     Data Reviewed:   CBC:  Recent Labs Lab 05/03/17 1814 05/04/17 0359 05/05/17 0448  WBC 11.0* 11.4* 9.4  HGB 13.1 12.1 12.5  HCT 37.8 35.2* 36.2  MCV 76.5* 77.7* 78.5  PLT 350 298 317   Basic Metabolic Panel:  Recent Labs Lab 05/03/17 1814 05/04/17 0359 05/04/17 1115 05/04/17 1555 05/05/17 0448  NA 118* 121* 121* 120* 121*  K 3.7 3.4* 3.6 3.4* 3.2*  CL 81* 85* 88* 87*  88*  CO2 28 24 24 26 24   GLUCOSE 106* 71 77 90 75  BUN 8 6 7 8 8   CREATININE 0.81 0.72 0.73 0.77 0.78  CALCIUM 9.1 8.4* 8.6* 8.5* 8.5*   GFR: Estimated Creatinine Clearance: 25.6 mL/min (by C-G formula based on SCr of 0.78 mg/dL). Liver Function Tests: No results for input(s): AST, ALT, ALKPHOS, BILITOT, PROT, ALBUMIN in the last 168 hours. No results for input(s): LIPASE, AMYLASE in the last 168 hours. No results for input(s): AMMONIA in the last 168 hours. Coagulation Profile: No results for input(s): INR, PROTIME in the last 168 hours. Cardiac Enzymes: No results for input(s): CKTOTAL, CKMB, CKMBINDEX, TROPONINI in the last 168 hours. BNP (last 3 results) No results for input(s): PROBNP  in the last 8760 hours. HbA1C: No results for input(s): HGBA1C in the last 72 hours. CBG:  Recent Labs Lab 05/03/17 1833 05/04/17 0558 05/04/17 0802  GLUCAP 96 79 82   Lipid Profile: No results for input(s): CHOL, HDL, LDLCALC, TRIG, CHOLHDL, LDLDIRECT in the last 72 hours. Thyroid Function Tests:  Recent Labs  05/04/17 0044  TSH 2.935   Anemia Panel: No results for input(s): VITAMINB12, FOLATE, FERRITIN, TIBC, IRON, RETICCTPCT in the last 72 hours. Urine analysis:    Component Value Date/Time   COLORURINE YELLOW 05/04/2017 1203   APPEARANCEUR CLEAR 05/04/2017 1203   LABSPEC 1.010 05/04/2017 1203   PHURINE 6.0 05/04/2017 1203   GLUCOSEU NEGATIVE 05/04/2017 1203   HGBUR NEGATIVE 05/04/2017 1203   BILIRUBINUR NEGATIVE 05/04/2017 1203   KETONESUR 5 (A) 05/04/2017 1203   PROTEINUR NEGATIVE 05/04/2017 1203   UROBILINOGEN 0.2 12/22/2014 0903   NITRITE NEGATIVE 05/04/2017 1203   LEUKOCYTESUR NEGATIVE 05/04/2017 1203   Sepsis Labs: @LABRCNTIP (procalcitonin:4,lacticidven:4)  ) Recent Results (from the past 240 hour(s))  MRSA PCR Screening     Status: None   Collection Time: 05/04/17 12:04 AM  Result Value Ref Range Status   MRSA by PCR NEGATIVE NEGATIVE Final    Comment:        The GeneXpert MRSA Assay (FDA approved for NASAL specimens only), is one component of a comprehensive MRSA colonization surveillance program. It is not intended to diagnose MRSA infection nor to guide or monitor treatment for MRSA infections.          Radiology Studies: Dg Chest 2 View  Result Date: 05/03/2017 CLINICAL DATA:  Images obtained for confusion. Unable to obtain patient hx due to patient confusion and not being able to speak. EXAM: CHEST  2 VIEW COMPARISON:  02/17/2017 FINDINGS: Atherosclerotic aortic arch. Mild left perihilar nodularity similar to prior. Pleural thickening laterally in the left mid hemithorax. Blunting of the right costophrenic angle with nodularity along  the right hemidiaphragm. Moderate enlargement of the cardiopericardial silhouette. Bony demineralization and thoracic spondylosis. Abdominal aortic atherosclerotic calcification. IMPRESSION: 1. Similar appearance of right pleural effusion, masslike appearance at the right lung base, and left pleural thickening with some nodularity. These findings were discussed in greater detail in the March 2018 CT scan as representing a combination of pleural plaques and possible right basilar mass. 2. Stable moderate cardiomegaly, without overt edema. 3.  Aortic Atherosclerosis (ICD10-I70.0). Electronically Signed   By: Gaylyn Rong M.D.   On: 05/03/2017 20:04   Ct Head Wo Contrast  Result Date: 05/03/2017 CLINICAL DATA:  Confusion and somnolence. EXAM: CT HEAD WITHOUT CONTRAST TECHNIQUE: Contiguous axial images were obtained from the base of the skull through the vertex without intravenous contrast. COMPARISON:  MRI brain 02/18/2017 FINDINGS:  Brain: Calcifications in the globus pallidus nuclei bilaterally, likely physiologic, and unchanged from prior. Periventricular white matter and corona radiata hypodensities favor chronic ischemic microvascular white matter disease. Otherwise, the brainstem, cerebellum, cerebral peduncles, thalami, basal ganglia, basilar cisterns, and ventricular system appear within normal limits. No intracranial hemorrhage, mass lesion, or acute CVA. Vascular: Cavernous carotid and vertebral artery atherosclerotic calcification observed. Skull: The odontoid extends 3 mm above the McRae line, compatible with basilar invagination. There may be some stenosis at the foramen magnum resulting. There is also calcification of the transverse ligament at C1-2. Sinuses/Orbits: Mild chronic ethmoid sinusitis. Other: No supplemental non-categorized findings. IMPRESSION: 1. No acute intracranial findings. 2. Periventricular white matter and corona radiata hypodensities favor chronic ischemic microvascular  white matter disease. 3. 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 4. Atherosclerosis of the cavernous carotid arteries and vertebral arteries. Electronically Signed   By: Gaylyn RongWalter  Liebkemann M.D.   On: 05/03/2017 20:24        Scheduled Meds: . calcium-vitamin D  1 tablet Oral Daily  . carvedilol  3.125 mg Oral BID WC  . enoxaparin (LOVENOX) injection  30 mg Subcutaneous QHS  . furosemide  20 mg Oral Daily  . hydrALAZINE  75 mg Oral TID  . pantoprazole  40 mg Oral Daily  . potassium chloride  40 mEq Oral BID  . sodium chloride flush  3 mL Intravenous Q12H   Continuous Infusions:    LOS: 0 days    Time spent: 25min    Zannie CovePreetha Taia Bramlett, MD [facility] Pager [phone]  If 7PM-7AM, please contact night-coverage [email or web address] Password [email or web address] 05/05/2017, 10:04 AM

## 2017-05-05 NOTE — Evaluation (Signed)
Occupational Therapy Evaluation Patient Details Name: [name] Fry MRN: [id number] DOB: [birth year] Today's Date: 05/05/2017    History of Present Illness Pt adm with hyponatremia. PMH - TIA, THR on rt, CVA, chf   Clinical Impression   Pt admitted with above. She demonstrates the below listed deficits and will benefit from continued OT to maximize safety and independence with BADLs.  Pt demonstrates generalized weakness, impaired balance, and decreased activity tolerance.  She requires min - mod A for ADLs - she moves very slowly and is slow to initiate tasks She lives with family and reports she was able to perform ADLs without assist.  Family not present during eval.  Feel she would benefit from [facility] if family agreeable.     Follow Up Recommendations  Home health OT, if family agreeable;Supervision/Assistance,- 24 hour    Equipment Recommendations  None recommended by OT    Recommendations for Other Services         Precautions / Restrictions Precautions Precautions: Fall Restrictions Weight Bearing Restrictions: No      Mobility Bed Mobility Overal bed mobility: Needs Assistance Bed Mobility: Supine to Sit;Sit to Supine     Supine to sit: Min assist Sit to supine: Min assist   General bed mobility comments: assist to initate task, move LEs to EOB and to lift trunk   Transfers Overall transfer level: Needs assistance Equipment used: 1 person hand held assist Transfers: Sit to/from Stand Sit to Stand: Min assist         General transfer comment: assist to boost into standing and assist for balance     Balance Overall balance assessment: Needs assistance Sitting-balance support: No upper extremity supported;Feet supported Sitting balance-Leahy Scale: Fair     Standing balance support: Bilateral upper extremity supported Standing balance-Leahy Scale: Poor Standing balance comment: reliant on UE support                            ADL either  performed or assessed with clinical judgement   ADL Overall ADL's : Needs assistance/impaired Eating/Feeding: Set up;Sitting   Grooming: Wash/dry face;Wash/dry hands;Oral care;Brushing hair;Minimal assistance;Sitting   Upper Body Bathing: Minimal assistance;Sitting   Lower Body Bathing: Sit to/from stand;Moderate assistance   Upper Body Dressing : Minimal assistance;Sitting       Toilet Transfer: Minimal assistance;Stand-pivot;BSC   Toileting- Clothing Manipulation and Hygiene: Minimal assistance;Sit to/from stand       Functional mobility during ADLs: Minimal assistance General ADL Comments: assist due to balance and pt very slow to perform activities      Vision         Perception     Praxis      Pertinent Vitals/Pain Pain Assessment: No/denies pain     Hand Dominance Right   Extremity/Trunk Assessment Upper Extremity Assessment Upper Extremity Assessment: Generalized weakness   Lower Extremity Assessment Lower Extremity Assessment: Defer to PT evaluation   Cervical / Trunk Assessment Cervical / Trunk Assessment: Kyphotic   Communication Communication Communication: HOH   Cognition Arousal/Alertness: Awake/alert Behavior During Therapy: WFL for tasks assessed/performed Overall Cognitive Status: No family/caregiver present to determine baseline cognitive functioning                                 General Comments: Pt is very slow to initiate activity    General Comments       Exercises  Shoulder Instructions      Home Living Family/patient expects to be discharged to:: Private residence Living Arrangements: Children;Other relatives Available Help at Discharge: Family;Available 24 hours/day Type of Home: House Home Access: Stairs to enter Entergy CorporationEntrance Stairs-Number of Steps: 2 Entrance Stairs-Rails: None Home Layout: One level     Bathroom Shower/Tub: Chief Strategy OfficerTub/shower unit   Bathroom Toilet: Standard Bathroom Accessibility: Yes    Home Equipment: Environmental consultantWalker - 2 wheels;Bedside commode;Shower seat          Prior Functioning/Environment Level of Independence: Needs assistance  Gait / Transfers Assistance Needed: amb with RW ADL's / Homemaking Assistance Needed: Family does all IADLs and drives.    Comments: Per prior admission - no family present         OT Problem List: Decreased strength;Decreased activity tolerance;Impaired balance (sitting and/or standing);Decreased cognition;Decreased safety awareness      OT Treatment/Interventions: Self-care/ADL training;DME and/or AE instruction;Therapeutic activities;Patient/family education;Balance training    OT Goals(Current goals can be found in the care plan section) Acute Rehab OT Goals Patient Stated Goal: did not staet  OT Goal Formulation: With patient Time For Goal Achievement: 05/19/17 Potential to Achieve Goals: Good ADL Goals Pt Will Perform Grooming: with min guard assist;standing Pt Will Perform Upper Body Bathing: with supervision;with set-up;sitting Pt Will Perform Lower Body Bathing: with min assist;sit to/from stand Pt Will Perform Upper Body Dressing: with min guard assist;sitting Pt Will Perform Lower Body Dressing: with min assist;sit to/from stand Pt Will Transfer to Toilet: with min guard assist;ambulating;regular height toilet;bedside commode;grab bars Pt Will Perform Toileting - Clothing Manipulation and hygiene: with min guard assist;sit to/from stand  OT Frequency: Min 2X/week   Barriers to D/C:            Co-evaluation              AM-PAC PT "6 Clicks" Daily Activity     Outcome Measure Help from another person eating meals?: A Little Help from another person taking care of personal grooming?: A Little Help from another person toileting, which includes using toliet, bedpan, or urinal?: A Little Help from another person bathing (including washing, rinsing, drying)?: A Little Help from another person to put on and taking off  regular upper body clothing?: A Little Help from another person to put on and taking off regular lower body clothing?: A Lot 6 Click Score: 17   End of Session Nurse Communication: Mobility status  Activity Tolerance: Patient tolerated treatment well Patient left: in chair;with call bell/phone within reach;with chair alarm set  OT Visit Diagnosis: Unsteadiness on feet (R26.81)                Time: [id number] OT Time Calculation (min): 20 min Charges:  OT General Charges $OT Visit: 1 Procedure OT Evaluation $OT Eval Low Complexity: 1 Procedure G-Codes: OT G-codes **NOT FOR INPATIENT CLASS** Functional Assessment Tool Used: AM-PAC 6 Clicks Daily Activity Functional Limitation: Self care Self Care Current Status (V4098(G8987): At least 40 percent but less than 60 percent impaired, limited or restricted Self Care Goal Status (J1914(G8988): At least 20 percent but less than 40 percent impaired, limited or restricted   Reynolds AmericanWendi Bevely Hackbart, OTR/L [phone]   Jeani HawkingConarpe, Elener Custodio M 05/05/2017, 3:38 PM

## 2017-05-06 DIAGNOSIS — I10 Essential (primary) hypertension: Secondary | ICD-10-CM

## 2017-05-06 LAB — BASIC METABOLIC PANEL
Anion gap: 11 (ref 5–15)
Anion gap: 10 (ref 5–15)
BUN: 7 mg/dL (ref 6–20)
BUN: 7 mg/dL (ref 6–20)
CALCIUM: 8.9 mg/dL (ref 8.9–10.3)
CHLORIDE: 89 mmol/L — AB (ref 101–111)
CHLORIDE: 88 mmol/L — AB (ref 101–111)
CO2: 24 mmol/L (ref 22–32)
CO2: 27 mmol/L (ref 22–32)
CREATININE: 0.69 mg/dL (ref 0.44–1.00)
CREATININE: 0.77 mg/dL (ref 0.44–1.00)
Calcium: 9.3 mg/dL (ref 8.9–10.3)
GFR calc Af Amer: >60 mL/min (ref >60)
GFR calc Af Amer: >60 mL/min (ref >60)
GFR calc non Af Amer: >60 mL/min (ref >60)
GFR calc non Af Amer: >60 mL/min (ref >60)
GLUCOSE: 121 mg/dL — AB (ref 65–99)
Glucose, Bld: 111 mg/dL — ABNORMAL HIGH (ref 65–99)
POTASSIUM: 4.1 mmol/L (ref 3.5–5.1)
Potassium: 4.1 mmol/L (ref 3.5–5.1)
SODIUM: 125 mmol/L — AB (ref 135–145)
Sodium: 124 mmol/L — ABNORMAL LOW (ref 135–145)

## 2017-05-06 LAB — GLUCOSE, CAPILLARY: Glucose-Capillary: 128 mg/dL — ABNORMAL HIGH (ref 65–99)

## 2017-05-06 NOTE — Care Management Note (Signed)
Case Management Note  Patient Details  Name: [name] Fry MRN: [id number] Date of Birth: 01/06/1920  Subjective/Objective:         CM following for progression and d/c planning.           Action/Plan: 05/06/2017 Call placed to pt daughter Ms Haskel KhanG  Thorner this am re [facility] and Hospice care services. Have received call from grandaughter @ 4:15 pm stating that the family will need to discuss and will make a decision by tomorrow morning, as pt may d/c to home tomorrow. Pt has had HH in the past, not active with any agency now.  This CM offered hospice agencies in [place] as the [place] lives in [place].  Family will discuss and make a decision by tomorrow morning [facility] and will notify this pt RN tomorrow so that the RN can contact the CM to arrange services.   Expected Discharge Date:                  Expected Discharge Plan:     In-House Referral:     Discharge planning Services  CM Consult  Post Acute Care Choice:    Choice offered to:     DME Arranged:    DME Agency:     HH Arranged:    HH Agency:     Status of Service:  In process, will continue to follow  If discussed at Long Length of Stay Meetings, dates discussed:    Additional Comments:  Starlyn SkeansRoyal, Davey Bergsma U, RN 05/06/2017, 4:23 PM

## 2017-05-06 NOTE — Consult Note (Signed)
   [facility] CM Inpatient Consult   05/06/2017  Barkley Brunslice E Porcaro 03-30-1920 [id number]   Patient assess for multiple hospitalizations in the past 6 months. Chart review reveals the patient is [name] Fry E Harrisis a 83 y.o.femalewith medical history significant ofhypertension, hyperlipidemia, GERD, stroke, arthritis, CHF with EF 25-30%, upper GI bleeding, hyponatremia, lung mass, who presents with generalized weakness, lethargy and AMS. pt had hyponatremia and admitted to hospital in March per MD notes.  Went by to speak with the patient and no family at the bedside.  Patient is up in the chair pleasantly confused. Left a brochure and contact information on the windowsill for family.  Spoke with inpatient RNCM, Elnita MaxwellCheryl and she states patient's family deciding on a palliative meeting with daughter and likely niece. Encour[facility].  Will follow for disposition and changes.  For questions, please contact:  Charlesetta ShanksVictoria Mariapaula Krist, RN BSN CCM [facility]ia[facility]  [phone] business mobile phone Toll free office [phone]

## 2017-05-06 NOTE — Progress Notes (Addendum)
PROGRESS NOTE    [name] Fry  ZOX:[id number] DOB: 31-Jan-1920 DOA: 05/03/2017 PCP: Wilmer Floor., MD  Brief Narrative:[name] Fry is a 83 y.o. female with medical history significant of hypertension, hyperlipidemia, GERD, stroke, arthritis, CHF with EF 25-30%, upper GI bleeding, hyponatremia, lung mass, who presents with generalized weakness, lethargy and AMS. pt had hyponatremia and admitted to hospital in March. She had CT-chest which showed right lung mass. Family chose to not pursue any workup for lung mass.  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 -baseline Na around 125 -Urine studies of limited value since she is chronically on diuretics -after adequate hydration, stopped IVF and started low dose PO lasix, Na improved to 124, keep on same dose PO lasix with fluid restriction today -Bmet tonight and in am  Metabolic encephalopathy -improving, mentation back to baseline -due to #1 and suspect some senile dementia -CT head unremarkable   HTN (hypertension) -continue coreg, hydralazine  Chronic combined systolic and diastolic CHF (congestive heart failure) (HCC) -ECHO 3/18 with EF 25% and grade 1DD -appears euvolemic now -continue Lasix -continue coreg  Mass of right lung -possibly malignant -no further workup or Rx planned, this has been discussed with family by multiple providers  ETHICS: I think overall her prognosis is poor due to advanced age, Cardiomyopathy, hyponatremia, severe malnutrition and recommended Hospice at home to the family if possible, they are open to considering this  DVT prophylaxis:lovenox Code Status: DNR Family Communication: None at bedside, called and discussed with the daughter  Crystalann Korf 6/14, and d/w grand daughter Hollie Salk this am Disposition Plan: home tomorrow   Subjective: Denies any complaints, no N/V  Objective: Vitals:   05/05/17 1643 05/05/17 2153 05/06/17 0440 05/06/17 0500  BP: (!) 161/87 (!) 158/87 (!) 178/83   Pulse:  66 79   Resp:  18 18   Temp:  97.7 F (36.5 C) 97.6 F (36.4 C)   TempSrc:   Oral   SpO2:  100% 99%   Weight:   43 kg (94 lb 14.4 oz) 43 kg (94 lb 12.8 oz)  Height:        Intake/Output Summary (Last 24 hours) at 05/06/17 1011 Last data filed at 05/05/17 2010  Gross per 24 hour  Intake              360 ml  Output              100 ml  Net              260 ml   Filed Weights   05/05/17 1129 05/06/17 0440 05/06/17 0500  Weight: 42 kg (92 lb 8 oz) 43 kg (94 lb 14.4 oz) 43 kg (94 lb 12.8 oz)    Examination:  Gen: elderly frail female laying in bed, no distress, alert, awake, oriented x2  HEENT: PERRLA, Neck supple, no JVD Lungs: Good air movement bilaterally, CTAB CVS: RRR,No Gallops,Rubs or new Murmurs Abd: soft, Non tender, non distended, BS present Extremities: No Cyanosis, Clubbing , trace edema Skin: no new rashes     Data Reviewed:   CBC:  Recent Labs Lab 05/03/17 1814 05/04/17 0359 05/05/17 0448  WBC 11.0* 11.4* 9.4  HGB 13.1 12.1  12.5  HCT 37.8 35.2* 36.2  MCV 76.5* 77.7* 78.5  PLT 350 298 317   Basic Metabolic Panel:  Recent Labs Lab 05/04/17 0359 05/04/17 1115 05/04/17 1555 05/05/17 0448 05/06/17 0818  NA 121* 121* 120* 121* 124*  K 3.4* 3.6 3.4* 3.2* 4.1  CL 85* 88* 87* 88* 89*  CO2 24 24 26 24 24   GLUCOSE 71 77 90 75 121*  BUN 6 7 8 8 7   CREATININE 0.72 0.73 0.77 0.78 0.69  CALCIUM 8.4* 8.6* 8.5* 8.5* 8.9   GFR: Estimated Creatinine Clearance: 25.6 mL/min (by C-G formula based on SCr of 0.69 mg/dL). Liver Function Tests: No results for input(s): AST, ALT, ALKPHOS, BILITOT, PROT, ALBUMIN in the last 168 hours. No results for input(s): LIPASE, AMYLASE in the last 168  hours. No results for input(s): AMMONIA in the last 168 hours. Coagulation Profile: No results for input(s): INR, PROTIME in the last 168 hours. Cardiac Enzymes: No results for input(s): CKTOTAL, CKMB, CKMBINDEX, TROPONINI in the last 168 hours. BNP (last 3 results) No results for input(s): PROBNP in the last 8760 hours. HbA1C: No results for input(s): HGBA1C in the last 72 hours. CBG:  Recent Labs Lab 05/03/17 1833 05/04/17 0558 05/04/17 0802 05/05/17 0716 05/06/17 0807  GLUCAP 96 79 82 92 128*   Lipid Profile: No results for input(s): CHOL, HDL, LDLCALC, TRIG, CHOLHDL, LDLDIRECT in the last 72 hours. Thyroid Function Tests:  Recent Labs  05/04/17 0044  TSH 2.935   Anemia Panel: No results for input(s): VITAMINB12, FOLATE, FERRITIN, TIBC, IRON, RETICCTPCT in the last 72 hours. Urine analysis:    Component Value Date/Time   COLORURINE YELLOW 05/04/2017 1203   APPEARANCEUR CLEAR 05/04/2017 1203   LABSPEC 1.010 05/04/2017 1203   PHURINE 6.0 05/04/2017 1203   GLUCOSEU NEGATIVE 05/04/2017 1203   HGBUR NEGATIVE 05/04/2017 1203   BILIRUBINUR NEGATIVE 05/04/2017 1203   KETONESUR 5 (A) 05/04/2017 1203   PROTEINUR NEGATIVE 05/04/2017 1203   UROBILINOGEN 0.2 12/22/2014 0903   NITRITE NEGATIVE 05/04/2017 1203   LEUKOCYTESUR NEGATIVE 05/04/2017 1203   Sepsis Labs: @LABRCNTIP (procalcitonin:4,lacticidven:4)  ) Recent Results (from the past 240 hour(s))  MRSA PCR Screening     Status: None   Collection Time: 05/04/17 12:04 AM  Result Value Ref Range Status   MRSA by PCR NEGATIVE NEGATIVE Final    Comment:        The GeneXpert MRSA Assay (FDA approved for NASAL specimens only), is one component of a comprehensive MRSA colonization surveillance program. It is not intended to diagnose MRSA infection nor to guide or monitor treatment for MRSA infections.   Urine culture     Status: None   Collection Time: 05/04/17 12:03 PM  Result Value Ref Range Status   Specimen  Description URINE, RANDOM  Final   Special Requests NONE  Final   Culture NO GROWTH  Final   Report Status 05/05/2017 FINAL  Final         Radiology Studies: No results found.      Scheduled Meds: . calcium-vitamin D  1 tablet Oral Daily  . carvedilol  3.125 mg Oral BID WC  . enoxaparin (LOVENOX) injection  30 mg Subcutaneous QHS  . furosemide  20 mg Oral Daily  . hydrALAZINE  75 mg Oral TID  . pantoprazole  40 mg Oral Daily  . potassium chloride  40 mEq Oral BID  . sodium chloride flush  3 mL Intravenous Q12H   Continuous Infusions:    LOS: 1  day    Time spent:    Zannie Cove, MD [facility] Pager [phone]  If 7PM-7AM, please contact night-coverage [email or web address] Password [email or web address] 05/06/2017, 10:11 AM

## 2017-05-06 NOTE — Care Management Note (Addendum)
Case Management Note  Patient Details  Name: Barkley Brunslice E Morino MRN: [id number] Date of Birth: [birth year]  Subjective/Objective:  CM following for progression and d/c planning.                   Action/Plan: 05/07/2017 [facility]. This CM contacted pt daughter Geryl RankinsGwyn Huot, who states that she will need to talk with her niece. This CM informed Ms Haskel KhanG Zagal that a referral would not mean that they accepted Hospice services but would mean that hospice rep would want to meet with the family to discuss options for the pt and possible care choices. Ms Haskel KhanG Niesen will speak to other family members and call the CM back with their wishes.  Will offer hospice agencies for family choice if they are in agreement.  3pm Have not had a response from family at this time, will continue to follow and call back about 4:15 if they have not called by that time.    Expected Discharge Date:                  Expected Discharge Plan:     In-House Referral:     Discharge planning Services  CM Consult  Post Acute Care Choice:    Choice offered to:     DME Arranged:    DME Agency:     HH Arranged:    HH Agency:     Status of Service:  In process, will continue to follow  If discussed at Long Length of Stay Meetings, dates discussed:    Additional Comments:  Starlyn SkeansRoyal, Jazae Gandolfi U, RN 05/06/2017, 10:57 AM

## 2017-05-06 NOTE — Progress Notes (Signed)
Pt blood pressure was soft at 79/39 when taken with danamap, 92/48 by manual. Dr. Jomarie LongsJoseph notified. Will continue to monitor per doctor Jomarie LongsJoseph order.

## 2017-05-07 LAB — BASIC METABOLIC PANEL WITH GFR
Anion gap: 11 (ref 5–15)
BUN: 10 mg/dL (ref 6–20)
CO2: 25 mmol/L (ref 22–32)
Calcium: 8.8 mg/dL — ABNORMAL LOW (ref 8.9–10.3)
Chloride: 89 mmol/L — ABNORMAL LOW (ref 101–111)
Creatinine, Ser: 0.96 mg/dL (ref 0.44–1.00)
GFR calc Af Amer: 56 mL/min — ABNORMAL LOW (ref >60)
GFR calc non Af Amer: 49 mL/min — ABNORMAL LOW (ref >60)
Glucose, Bld: 87 mg/dL (ref 65–99)
Potassium: 4.5 mmol/L (ref 3.5–5.1)
Sodium: 125 mmol/L — ABNORMAL LOW (ref 135–145)

## 2017-05-07 LAB — GLUCOSE, CAPILLARY: GLUCOSE-CAPILLARY: 79 mg/dL (ref 65–99)

## 2017-05-07 NOTE — Progress Notes (Signed)
Patient was discharged home with home health by MD order; discharged instructions  review and give to patient and her daughter with care notes; IV DIC; patient will be escorted to the car by nurse tech via wheelchair.  

## 2017-05-07 NOTE — Progress Notes (Signed)
Pt. refused po meds- sleepy/drowsy.

## 2017-05-07 NOTE — Plan of Care (Signed)
Problem: Education: Goal: Knowledge of [facility] Education information/materials will improve Outcome: Not Progressing Refusing po meds; seems withdrawn.

## 2017-05-07 NOTE — Progress Notes (Signed)
CM spoke with daughter Morey HummingbirdGwen Bartoszek [phone] and then with niece (per Gwen's request) Cherylynn RidgesMyra Brown [phone] as family wishes to pursue a home hospice evaluation after they go home.  Choice of home health agency was offered and [facility].  [facility], Jermaine and physical house address is [address] [place] [id number].  Family states pt has both a a rolling walker and bedside commode and at this time declines additional DME.  Family will provide transportation home.  No other CM needs were communicated.

## 2017-05-07 NOTE — Discharge Summary (Signed)
Physician Discharge Summary  [name] Fry GMW:[id number] DOB: 25-Oct-1920 DOA: 05/03/2017  PCP: Wilmer Floor., MD  Admit date: 05/03/2017 Discharge date: 05/07/2017  Time spent: 35 minutes  Recommendations for Outpatient Follow-up:  1. PCP in 1 week 2. Home Hospice evaluation recommended   Discharge Diagnoses:  Principal Problem:   Hyponatremia   Chronic combined systolic CHF -EF 44%   HTN (hypertension)   Acute metabolic encephalopathy   GERD (gastroesophageal reflux disease)   Mass of right lung   Discharge Condition: stable  Diet recommendation: heart healthy  Filed Weights   05/05/17 1129 05/06/17 0440 05/06/17 0500  Weight: 42 kg (92 lb 8 oz) 43 kg (94 lb 14.4 oz) 43 kg (94 lb 12.8 oz)    History of present illness:  [name] Fry a 83 y.o.femalewith medical history significant ofhypertension, hyperlipidemia, GERD, stroke, arthritis, CHF with EF 25-30%, upper GI bleeding, hyponatremia, lung mass, who presents with generalized weakness, lethargy and AMS. pt had hyponatremia and admitted to hospital in March. She had CT-chest which showed right lung mass. Family chose to not pursue any workup for lung mass.  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 -baseline Na around 125 -With mild improvement from admission of 118 to 121 with IVF then plateaued at 121 -Urine studies of limited value since she is chronically on diuretics -after adequate hydration, stopped IVF and started low dose PO lasix, Na improved to 124-125, keep on same dose PO lasix with fluid restriction today -stable and back to baseline now  Metabolic encephalopathy -improving, mentation back to baseline -due to #1 and suspect some senile dementia -CT head unremarkable   HTN (hypertension) -continue coreg, hydralazine  Chronic combined  systolic and diastolic CHF (congestive heart failure) (HCC) -ECHO 3/18 with EF 25% and grade 1DD -euvolemic now, continued on  Lasix and Coreg  Mass of right lung -possibly malignant -no further workup or Rx planned, this has been discussed with family by multiple providers  ETHICS: I think overall her prognosis is poor due to advanced age, Cardiomyopathy, hyponatremia, severe malnutrition and recommended Hospice at home to the family if possible, they are open to considering this.Case manager consulted to set up Home Hospice evaluation  Discharge Exam: Vitals:   05/06/17 2101 05/07/17 0528  BP: (!) 128/54 (!) 130/56  Pulse: 71 77  Resp: 16 18  Temp: 97.6 F (36.4 C) 97.9 F (36.6 C)    General: AAOx3 Cardiovascular: S1S2/RRR Respiratory: CTAB  Discharge Instructions   Discharge Instructions    Diet - low sodium heart healthy    Complete by:  As directed    Increase activity slowly    Complete by:  As directed      Current Discharge Medication List    CONTINUE these medications which have NOT CHANGED   Details  acetaminophen (TYLENOL) 325 MG tablet Take 325 mg by mouth every 6 (six) hours as needed for mild pain, moderate pain or headache.     calcium-vitamin D (OSCAL WITH D) 500-200 MG-UNIT per tablet Take 1 tablet by mouth daily with breakfast. Qty: 42 tablet, Refills: 0    carvedilol (COREG) 3.125 MG tablet Take 1 tablet (3.125 mg total) by mouth 2 (two) times daily with a meal. Qty: 60 tablet, Refills: 0    Docusate Calcium (STOOL SOFTENER PO) Take 1 capsule by mouth as  needed (for constipation).    furosemide (LASIX) 20 MG tablet Take 1 tablet (20 mg total) by mouth daily. Qty: 30 tablet, Refills: 0    hydrALAZINE (APRESOLINE) 50 MG tablet Take 1.5 tablets (75 mg total) by mouth 3 (three) times daily. Qty: 60 tablet, Refills: 0    loratadine (CLARITIN) 10 MG tablet Take 10 mg by mouth daily.    pantoprazole (PROTONIX) 40 MG tablet Take 1 tablet (40 mg  total) by mouth daily. Qty: 30 tablet, Refills: 0    Potassium Chloride ER 20 MEQ TBCR Take 20 mEq by mouth daily. Qty: 30 tablet, Refills: 0       No Known Allergies    The results of significant diagnostics from this hospitalization (including imaging, microbiology, ancillary and laboratory) are listed below for reference.    Significant Diagnostic Studies: Dg Chest 2 View  Result Date: 05/03/2017 CLINICAL DATA:  Images obtained for confusion. Unable to obtain patient hx due to patient confusion and not being able to speak. EXAM: CHEST  2 VIEW COMPARISON:  02/17/2017 FINDINGS: Atherosclerotic aortic arch. Mild left perihilar nodularity similar to prior. Pleural thickening laterally in the left mid hemithorax. Blunting of the right costophrenic angle with nodularity along the right hemidiaphragm. Moderate enlargement of the cardiopericardial silhouette. Bony demineralization and thoracic spondylosis. Abdominal aortic atherosclerotic calcification. IMPRESSION: 1. Similar appearance of right pleural effusion, masslike appearance at the right lung base, and left pleural thickening with some nodularity. These findings were discussed in greater detail in the March 2018 CT scan as representing a combination of pleural plaques and possible right basilar mass. 2. Stable moderate cardiomegaly, without overt edema. 3.  Aortic Atherosclerosis (ICD10-I70.0). Electronically Signed   By: Gaylyn RongWalter  Liebkemann M.D.   On: 05/03/2017 20:04   Ct Head Wo Contrast  Result Date: 05/03/2017 CLINICAL DATA:  Confusion and somnolence. EXAM: CT HEAD WITHOUT CONTRAST TECHNIQUE: Contiguous axial images were obtained from the base of the skull through the vertex without intravenous contrast. COMPARISON:  MRI brain 02/18/2017 FINDINGS: Brain: Calcifications in the globus pallidus nuclei bilaterally, likely physiologic, and unchanged from prior. Periventricular white matter and corona radiata hypodensities favor chronic  ischemic microvascular white matter disease. Otherwise, the brainstem, cerebellum, cerebral peduncles, thalami, basal ganglia, basilar cisterns, and ventricular system appear within normal limits. No intracranial hemorrhage, mass lesion, or acute CVA. Vascular: Cavernous carotid and vertebral artery atherosclerotic calcification observed. Skull: The odontoid extends 3 mm above the McRae line, compatible with basilar invagination. There may be some stenosis at the foramen magnum resulting. There is also calcification of the transverse ligament at C1-2. Sinuses/Orbits: Mild chronic ethmoid sinusitis. Other: No supplemental non-categorized findings. IMPRESSION: 1. No acute intracranial findings. 2. Periventricular white matter and corona radiata hypodensities favor chronic ischemic microvascular white matter disease. 3. 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 4. Atherosclerosis of the cavernous carotid arteries and vertebral arteries. Electronically Signed   By: Gaylyn RongWalter  Liebkemann M.D.   On: 05/03/2017 20:24    Microbiology: Recent Results (from the past 240 hour(s))  MRSA PCR Screening     Status: None   Collection Time: 05/04/17 12:04 AM  Result Value Ref Range Status   MRSA by PCR NEGATIVE NEGATIVE Final    Comment:        The GeneXpert MRSA Assay (FDA approved for NASAL specimens only), is one component of a comprehensive MRSA colonization surveillance program. It is not intended to diagnose MRSA infection nor to guide or monitor treatment for MRSA infections.  Urine culture     Status: None   Collection Time: 05/04/17 12:03 PM  Result Value Ref Range Status   Specimen Description URINE, RANDOM  Final   Special Requests NONE  Final   Culture NO GROWTH  Final   Report Status 05/05/2017 FINAL  Final     Labs: Basic Metabolic Panel:  Recent Labs Lab 05/04/17 1555 05/05/17 0448 05/06/17 0818 05/06/17 1641 05/07/17 0332  NA 120* 121* 124*  125* 125*  K 3.4* 3.2* 4.1 4.1 4.5  CL 87* 88* 89* 88* 89*  CO2 26 24 24 27 25   GLUCOSE 90 75 121* 111* 87  BUN 8 8 7 7 10   CREATININE 0.77 0.78 0.69 0.77 0.96  CALCIUM 8.5* 8.5* 8.9 9.3 8.8*   Liver Function Tests: No results for input(s): AST, ALT, ALKPHOS, BILITOT, PROT, ALBUMIN in the last 168 hours. No results for input(s): LIPASE, AMYLASE in the last 168 hours. No results for input(s): AMMONIA in the last 168 hours. CBC:  Recent Labs Lab 05/03/17 1814 05/04/17 0359 05/05/17 0448  WBC 11.0* 11.4* 9.4  HGB 13.1 12.1 12.5  HCT 37.8 35.2* 36.2  MCV 76.5* 77.7* 78.5  PLT 350 298 317   Cardiac Enzymes: No results for input(s): CKTOTAL, CKMB, CKMBINDEX, TROPONINI in the last 168 hours. BNP: BNP (last 3 results)  Recent Labs  02/10/17 0549 02/17/17 1358 05/03/17 1934  BNP 470.4* 337.5* 527.4*    ProBNP (last 3 results) No results for input(s): PROBNP in the last 8760 hours.  CBG:  Recent Labs Lab 05/04/17 0558 05/04/17 0802 05/05/17 0716 05/06/17 0807 05/07/17 0810  GLUCAP 79 82 92 128* 79       Signed:  Birdie Fetty MD.  [facility] 05/07/2017, 10:42 AM

## 2017-06-01 DIAGNOSIS — I509 Heart failure, unspecified: Secondary | ICD-10-CM | POA: Diagnosis not present

## 2017-06-01 DIAGNOSIS — E871 Hypo-osmolality and hyponatremia: Secondary | ICD-10-CM | POA: Diagnosis not present

## 2017-06-01 DIAGNOSIS — J984 Other disorders of lung: Secondary | ICD-10-CM | POA: Diagnosis not present

## 2017-06-01 DIAGNOSIS — I10 Essential (primary) hypertension: Secondary | ICD-10-CM | POA: Diagnosis not present

## 2017-06-01 DIAGNOSIS — M533 Sacrococcygeal disorders, not elsewhere classified: Secondary | ICD-10-CM | POA: Diagnosis not present

## 2017-06-03 DIAGNOSIS — E871 Hypo-osmolality and hyponatremia: Secondary | ICD-10-CM | POA: Diagnosis not present

## 2017-08-12 DIAGNOSIS — Z0000 Encounter for general adult medical examination without abnormal findings: Secondary | ICD-10-CM | POA: Diagnosis not present

## 2017-08-12 DIAGNOSIS — Z9181 History of falling: Secondary | ICD-10-CM | POA: Diagnosis not present

## 2017-08-12 DIAGNOSIS — Z1389 Encounter for screening for other disorder: Secondary | ICD-10-CM | POA: Diagnosis not present

## 2017-08-12 DIAGNOSIS — Z136 Encounter for screening for cardiovascular disorders: Secondary | ICD-10-CM | POA: Diagnosis not present

## 2017-08-12 DIAGNOSIS — E785 Hyperlipidemia, unspecified: Secondary | ICD-10-CM | POA: Diagnosis not present

## 2017-08-24 DIAGNOSIS — I509 Heart failure, unspecified: Secondary | ICD-10-CM | POA: Diagnosis not present

## 2017-08-24 DIAGNOSIS — E871 Hypo-osmolality and hyponatremia: Secondary | ICD-10-CM | POA: Diagnosis not present

## 2017-08-24 DIAGNOSIS — I10 Essential (primary) hypertension: Secondary | ICD-10-CM | POA: Diagnosis not present

## 2017-11-02 ENCOUNTER — Other Ambulatory Visit: Payer: Self-pay

## 2017-11-02 ENCOUNTER — Inpatient Hospital Stay (HOSPITAL_COMMUNITY)
Admission: EM | Admit: 2017-11-02 | Discharge: 2017-11-05 | DRG: 193 | Disposition: A | Discharge status: Hospice | Payer: Medicare Other | Attending: Family Medicine | Admitting: Family Medicine

## 2017-11-02 ENCOUNTER — Emergency Department (HOSPITAL_COMMUNITY): Payer: Medicare Other

## 2017-11-02 ENCOUNTER — Encounter (HOSPITAL_COMMUNITY): Payer: Self-pay | Admitting: *Deleted

## 2017-11-02 DIAGNOSIS — I110 Hypertensive heart disease with heart failure: Secondary | ICD-10-CM | POA: Diagnosis not present

## 2017-11-02 DIAGNOSIS — Z66 Do not resuscitate: Secondary | ICD-10-CM | POA: Diagnosis not present

## 2017-11-02 DIAGNOSIS — I5042 Chronic combined systolic (congestive) and diastolic (congestive) heart failure: Secondary | ICD-10-CM | POA: Diagnosis not present

## 2017-11-02 DIAGNOSIS — I509 Heart failure, unspecified: Secondary | ICD-10-CM | POA: Diagnosis not present

## 2017-11-02 DIAGNOSIS — J9811 Atelectasis: Secondary | ICD-10-CM | POA: Diagnosis not present

## 2017-11-02 DIAGNOSIS — E860 Dehydration: Secondary | ICD-10-CM | POA: Diagnosis not present

## 2017-11-02 DIAGNOSIS — I248 Other forms of acute ischemic heart disease: Secondary | ICD-10-CM | POA: Diagnosis present

## 2017-11-02 DIAGNOSIS — D72829 Elevated white blood cell count, unspecified: Secondary | ICD-10-CM | POA: Diagnosis present

## 2017-11-02 DIAGNOSIS — K219 Gastro-esophageal reflux disease without esophagitis: Secondary | ICD-10-CM | POA: Diagnosis present

## 2017-11-02 DIAGNOSIS — J189 Pneumonia, unspecified organism: Secondary | ICD-10-CM | POA: Diagnosis present

## 2017-11-02 DIAGNOSIS — E785 Hyperlipidemia, unspecified: Secondary | ICD-10-CM | POA: Diagnosis present

## 2017-11-02 DIAGNOSIS — Z9841 Cataract extraction status, right eye: Secondary | ICD-10-CM

## 2017-11-02 DIAGNOSIS — Z96641 Presence of right artificial hip joint: Secondary | ICD-10-CM | POA: Diagnosis not present

## 2017-11-02 DIAGNOSIS — Z515 Encounter for palliative care: Secondary | ICD-10-CM

## 2017-11-02 DIAGNOSIS — E43 Unspecified severe protein-calorie malnutrition: Secondary | ICD-10-CM | POA: Diagnosis not present

## 2017-11-02 DIAGNOSIS — E7800 Pure hypercholesterolemia, unspecified: Secondary | ICD-10-CM | POA: Diagnosis not present

## 2017-11-02 DIAGNOSIS — Z8249 Family history of ischemic heart disease and other diseases of the circulatory system: Secondary | ICD-10-CM

## 2017-11-02 DIAGNOSIS — R29810 Facial weakness: Secondary | ICD-10-CM | POA: Diagnosis not present

## 2017-11-02 DIAGNOSIS — N179 Acute kidney failure, unspecified: Secondary | ICD-10-CM

## 2017-11-02 DIAGNOSIS — R402441 Other coma, without documented Glasgow coma scale score, or with partial score reported, in the field [EMT or ambulance]: Secondary | ICD-10-CM | POA: Diagnosis not present

## 2017-11-02 DIAGNOSIS — D649 Anemia, unspecified: Secondary | ICD-10-CM | POA: Diagnosis present

## 2017-11-02 DIAGNOSIS — I441 Atrioventricular block, second degree: Secondary | ICD-10-CM | POA: Diagnosis present

## 2017-11-02 DIAGNOSIS — Z8673 Personal history of transient ischemic attack (TIA), and cerebral infarction without residual deficits: Secondary | ICD-10-CM

## 2017-11-02 DIAGNOSIS — R001 Bradycardia, unspecified: Secondary | ICD-10-CM | POA: Diagnosis present

## 2017-11-02 DIAGNOSIS — Z7189 Other specified counseling: Secondary | ICD-10-CM | POA: Diagnosis not present

## 2017-11-02 LAB — CBC WITH DIFFERENTIAL/PLATELET
BASOS ABS: 0 10*3/uL (ref 0.0–0.1)
Basophils Relative: 0 %
EOS PCT: 0 %
Eosinophils Absolute: 0 10*3/uL (ref 0.0–0.7)
HEMATOCRIT: 43 % (ref 36.0–46.0)
Hemoglobin: 12.9 g/dL (ref 12.0–15.0)
LYMPHS PCT: 8 %
Lymphs Abs: 1.5 10*3/uL (ref 0.7–4.0)
MCH: 27.7 pg (ref 26.0–34.0)
MCHC: 30 g/dL (ref 30.0–36.0)
MCV: 92.5 fL (ref 78.0–100.0)
MONO ABS: 1.8 10*3/uL — AB (ref 0.1–1.0)
MONOS PCT: 10 %
Neutro Abs: 15.3 10*3/uL — ABNORMAL HIGH (ref 1.7–7.7)
Neutrophils Relative %: 82 %
PLATELETS: 353 10*3/uL (ref 150–400)
RBC: 4.65 MIL/uL (ref 3.87–5.11)
RDW: 14.4 % (ref 11.5–15.5)
WBC: 18.7 10*3/uL — ABNORMAL HIGH (ref 4.0–10.5)

## 2017-11-02 LAB — BRAIN NATRIURETIC PEPTIDE: B NATRIURETIC PEPTIDE 5: 707.1 pg/mL — AB (ref 0.0–100.0)

## 2017-11-02 LAB — BASIC METABOLIC PANEL
ANION GAP: 5 (ref 5–15)
BUN: 40 mg/dL — ABNORMAL HIGH (ref 6–20)
CALCIUM: 9.3 mg/dL (ref 8.9–10.3)
CO2: 40 mmol/L — ABNORMAL HIGH (ref 22–32)
Chloride: 94 mmol/L — ABNORMAL LOW (ref 101–111)
Creatinine, Ser: 1.41 mg/dL — ABNORMAL HIGH (ref 0.44–1.00)
GFR, EST AFRICAN AMERICAN: 35 mL/min — AB (ref >60)
GFR, EST NON AFRICAN AMERICAN: 31 mL/min — AB (ref >60)
GLUCOSE: 115 mg/dL — AB (ref 65–99)
POTASSIUM: 5.1 mmol/L (ref 3.5–5.1)
Sodium: 139 mmol/L (ref 135–145)

## 2017-11-02 LAB — TROPONIN I: TROPONIN I: 0.03 ng/mL — AB (ref <0.03)

## 2017-11-02 MED ORDER — ENOXAPARIN SODIUM 30 MG/0.3ML ~~LOC~~ SOLN
30.0000 mg | Freq: Every day | SUBCUTANEOUS | Status: DC
  Administered 2017-11-03: 30 mg via SUBCUTANEOUS
  Filled 2017-11-02: qty 0.3

## 2017-11-02 MED ORDER — DEXTROSE 5 % IV SOLN
500.0000 mg | Freq: Once | INTRAVENOUS | Status: AC
  Administered 2017-11-02: 500 mg via INTRAVENOUS
  Filled 2017-11-02: qty 500

## 2017-11-02 MED ORDER — DEXTROSE 5 % IV SOLN
1.0000 g | INTRAVENOUS | Status: DC
  Administered 2017-11-03 – 2017-11-04 (×2): 1 g via INTRAVENOUS
  Filled 2017-11-02 (×2): qty 10

## 2017-11-02 MED ORDER — DEXTROSE 5 % IV SOLN
1.0000 g | Freq: Once | INTRAVENOUS | Status: AC
  Administered 2017-11-02: 1 g via INTRAVENOUS
  Filled 2017-11-02: qty 10

## 2017-11-02 MED ORDER — HYDRALAZINE HCL 50 MG PO TABS: 75.0000 mg | ORAL_TABLET | Freq: Three times a day (TID) | ORAL | Status: DC

## 2017-11-02 MED ORDER — DEXTROSE 5 % IV SOLN
500.0000 mg | INTRAVENOUS | Status: DC
  Administered 2017-11-03 – 2017-11-04 (×2): 500 mg via INTRAVENOUS
  Filled 2017-11-02 (×2): qty 500

## 2017-11-02 MED ORDER — PANTOPRAZOLE SODIUM 40 MG PO TBEC: 40.0000 mg | DELAYED_RELEASE_TABLET | Freq: Every day | ORAL | Status: DC

## 2017-11-02 NOTE — ED Notes (Signed)
ED TO INPATIENT HANDOFF REPORT  Name/Age/Gender [name] Fry 83 y.o. female  Code Status Code Status History    Date Active Date Inactive Code Status Order ID Comments User Context   05/03/2017 22:16 05/07/2017 18:57 DNR [id number]  Ivor Costa, MD ED   02/17/2017 18:02 02/21/2017 17:24 DNR [id number]  Kerney Elbe, DO Inpatient   02/09/2017 22:19 02/11/2017 16:56 DNR [id number]  Ivor Costa, MD ED   02/09/2017 21:47 02/09/2017 22:19 Full Code [id number]  Ivor Costa, MD ED   10/12/2016 15:56 10/18/2016 23:02 DNR [id number]  Jannette Fogo, NP Inpatient   10/08/2016 17:05 10/12/2016 15:56 Full Code [id number]  Everrett Coombe, MD Inpatient   12/22/2014 16:29 12/23/2014 19:35 Full Code [id number]  Juluis Mire, MD Inpatient   07/10/2014 19:40 07/12/2014 21:53 Full Code [id number]  Marianna Payment, MD Inpatient   07/10/2014 03:19 07/10/2014 19:40 Full Code [id number]  Rise Patience, MD Inpatient    Questions for Most Recent Historical Code Status (Order [id number])    Question Answer Comment   In the event of cardiac or respiratory ARREST Do not call a "code blue"    In the event of cardiac or respiratory ARREST Do not perform Intubation, CPR, defibrillation or ACLS    In the event of cardiac or respiratory ARREST Use medication by any route, position, wound care, and other measures to relive pain and suffering. May use oxygen, suction and manual treatment of airway obstruction as needed for comfort.       Home/SNF/Other Home  Chief Complaint slow heart rate   Level of Care/Admitting Diagnosis ED Disposition    ED Disposition Condition Comment   Admit  Hospital Area: [facility] [[id number]]  Level of Care: Telemetry [5]  Admit to tele based on following criteria: Other see comments  Comments: trop elevation  Diagnosis: CAP (community acquired pneumonia) [004599]  Admitting Physician: Jani Gravel [[id number]]  Attending Physician: Jani Gravel [phone]  Estimated length of  stay: past midnight tomorrow  Certification:: I certify this patient will need inpatient services for at least 2 midnights  PT Class (Do Not Modify): Inpatient [101]  PT Acc Code (Do Not Modify): Private [1]       Medical History Past Medical History:  Diagnosis Date  . Arthritis    "arms" (10/13/2016)  . High cholesterol   . Hypertension   . Hyponatremia   . Stroke [facility]) ~ 2015   "swallow difficulties since" (10/13/2016)    Allergies No Known Allergies  IV Location/Drains/Wounds Patient Lines/Drains/Airways Status   Active Line/Drains/Airways    None          Labs/Imaging Results for orders placed or performed during the hospital encounter of 11/02/17 (from the past 48 hour(s))  CBC with Differential     Status: Abnormal   Collection Time: 11/02/17  7:00 PM  Result Value Ref Range   WBC 18.7 (H) 4.0 - 10.5 K/uL   RBC 4.65 3.87 - 5.11 MIL/uL   Hemoglobin 12.9 12.0 - 15.0 g/dL   HCT 43.0 36.0 - 46.0 %   MCV 92.5 78.0 - 100.0 fL   MCH 27.7 26.0 - 34.0 pg   MCHC 30.0 30.0 - 36.0 g/dL   RDW 14.4 11.5 - 15.5 %   Platelets 353 150 - 400 K/uL   Neutrophils Relative % 82 %   Neutro Abs 15.3 (H) 1.7 - 7.7 K/uL   Lymphocytes Relative 8 %   Lymphs Abs 1.5 0.7 - 4.0 K/uL   Monocytes  Relative 10 %   Monocytes Absolute 1.8 (H) 0.1 - 1.0 K/uL   Eosinophils Relative 0 %   Eosinophils Absolute 0.0 0.0 - 0.7 K/uL   Basophils Relative 0 %   Basophils Absolute 0.0 0.0 - 0.1 K/uL  Basic metabolic panel     Status: Abnormal   Collection Time: 11/02/17  7:00 PM  Result Value Ref Range   Sodium 139 135 - 145 mmol/L   Potassium 5.1 3.5 - 5.1 mmol/L   Chloride 94 (L) 101 - 111 mmol/L   CO2 40 (H) 22 - 32 mmol/L   Glucose, Bld 115 (H) 65 - 99 mg/dL   BUN 40 (H) 6 - 20 mg/dL   Creatinine, Ser 1.41 (H) 0.44 - 1.00 mg/dL   Calcium 9.3 8.9 - 10.3 mg/dL   GFR calc non Af Amer 31 (L) >60 mL/min   GFR calc Af Amer 35 (L) >60 mL/min    Comment: (NOTE) The eGFR has been calculated  using the CKD EPI equation. This calculation has not been validated in all clinical situations. eGFR's persistently <60 mL/min signify possible Chronic Kidney Disease.    Anion gap 5 5 - 15  Brain natriuretic peptide     Status: Abnormal   Collection Time: 11/02/17  7:00 PM  Result Value Ref Range   B Natriuretic Peptide 707.1 (H) 0.0 - 100.0 pg/mL  Troponin I     Status: Abnormal   Collection Time: 11/02/17  7:00 PM  Result Value Ref Range   Troponin I 0.03 (HH) <0.03 ng/mL    Comment: CRITICAL RESULT CALLED TO, READ BACK BY AND VERIFIED WITH: Richardson Dopp [id number] @ [place]    Dg Chest Port 1 View  Result Date: 11/02/2017 CLINICAL DATA:  Congestive heart failure. Decreased level of consciousness. EXAM: PORTABLE CHEST 1 VIEW COMPARISON:  05/03/2017 FINDINGS: Enlarged cardiac silhouette. Calcific atherosclerotic disease and tortuosity of the aorta. There is no evidence of focal pneumothorax. Persistent nodular density in the right lower lobe and left upper lobe. Moderate right pleural effusion and smaller left pleural effusion. Bilateral lower lobe atelectasis versus airspace consolidation. Osseous structures are without acute abnormality. Soft tissues are grossly normal. IMPRESSION: Persistent nodular density in the right lower lobe and left upper lobe. Moderate right pleural effusion and smaller left pleural effusion. Bilateral lower lobe atelectasis versus airspace consolidation. Overall the aeration of the lungs is slightly worsened than on the prior radiograph. Electronically Signed   By: Fidela Salisbury M.D.   On: 11/02/2017 19:09    Pending Labs Unresulted Labs (From admission, onward)   Start     Ordered   11/02/17 1939  TSH  STAT,   STAT     11/02/17 1938   11/02/17 1938  Blood culture (routine x 2)  BLOOD CULTURE X 2,   STAT     11/02/17 1937   11/02/17 1859  Urinalysis, Routine w reflex microscopic  STAT,   STAT     11/02/17 1858      Vitals/Pain Today's  Vitals   11/02/17 1809 11/02/17 1930 11/02/17 2051 11/02/17 2124  BP: (!) 164/54 (!) 120/43  (!) 102/41  Pulse: (!) 31 (!) 32  (!) 36  Resp: _0 Temp: (!) 96.9 F (36.1 C)  (!) 96.1 F (35.6 C)   TempSrc: Axillary  Rectal   SpO2: 100% 100%  100%    Isolation Precautions No active isolations  Medications Medications  azithromycin (ZITHROMAX) 500 mg in dextrose  5 % 250 mL IVPB (500 mg Intravenous New Bag/Given 11/02/17 2216)  cefTRIAXone (ROCEPHIN) 1 g in dextrose 5 % 50 mL IVPB (0 g Intravenous Stopped 11/02/17 2210)    Mobility non-ambulatory

## 2017-11-02 NOTE — ED Notes (Signed)
Bed: [id number] Expected date:  Expected time:  Means of arrival:  Comments: EMS/< L.O.C.

## 2017-11-02 NOTE — ED Triage Notes (Signed)
Pt is from home [facility]. Per pt family pt has hx of low Na+ and irregular bradycardia. Pt was seen by hospice nurse today, but pt family was concerned with low HR and requested pt be transferred to ED. Pt present lethargic, responds to verbal stimuli, HR 29-45 BPM

## 2017-11-02 NOTE — ED Notes (Signed)
Blood draw delayed.  Per RN and charge RN, pt is a hard stick and they will consult EDP before another blood draw is attempted.

## 2017-11-02 NOTE — ED Notes (Signed)
Attempted to get blood from pt, but was unsuccessful.  

## 2017-11-02 NOTE — ED Notes (Addendum)
Multiple IV attempts. Ultrasound IV being attempted.

## 2017-11-02 NOTE — ED Notes (Signed)
Pt purewick catheter on pt to get urine.

## 2017-11-02 NOTE — H&P (Signed)
TRH H&P   Patient Demographics:    [name] Fry, is a 83 y.o. female  MRN: [id number]   DOB - 18-Jul-1920  Admit Date - 11/02/2017  Outpatient Primary MD for the patient is Wilmer Floorampbell, Stephen D., MD  Referring MD/NP/PA:  Shanna CiscoJamie Ward  Outpatient Specialists:   Patient coming from: home  Chief Complaint  Patient presents with  . Bradycardia      HPI:    [name] Fry  is a 83 y.o. female, w hypertension, hyperlipidemia, CVA who is currently on Hospice was apparently less responsive today at home. Slight cough.  Pt was brought to ED for evaluation and found to have CAP.    In ED,  CXR  IMPRESSION: Persistent nodular density in the right lower lobe and left upper lobe.  Moderate right pleural effusion and smaller left pleural effusion.  Bilateral lower lobe atelectasis versus airspace consolidation.  Overall the aeration of the lungs is slightly worsened than on the prior radiograph.  Wbc 18.7, Hgb 12.9, Pl t 353  Na 139, K 5.1,  Bun 40, Creatinine 1.41 BNP 707.1 Trop 0.03   Pt noted to be bradycardic.  Family is not interested in pacer or intervention at this time.  Pt is confirmed DNR.  Family requests Iv Abx and hydration for now.         Review of systems:    In addition to the HPI above, No Fever-chills, No Headache, No changes with Vision or hearing, No problems swallowing food or Liquids, No Chest pain, no sob No Abdominal pain, No Nausea or Vommitting, Bowel movements are regular, No Blood in stool or Urine, No dysuria, No new skin rashes or bruises, No new joints pains-aches,  No new weakness, tingling, numbness in any extremity, No recent weight gain or loss, No polyuria, polydypsia or polyphagia, No significant Mental Stressors.  A full 10 point Review of Systems was done, except as stated above, all other Review of Systems were  negative.   With Past History of the following :    Past Medical History:  Diagnosis Date  . Arthritis    "arms" (10/13/2016)  . High cholesterol   . Hypertension   . Hyponatremia   . Stroke [facility]) ~ 2015   "swallow difficulties since" (10/13/2016)      Past Surgical History:  Procedure Laterality Date  . ABDOMINAL HERNIA REPAIR    . CATARACT EXTRACTION Right   . HERNIA REPAIR    . HIP ARTHROPLASTY Right 07/10/2014   Procedure: ARTHROPLASTY BIPOLAR HIP;  Surgeon: Cheral AlmasNaiping Michael Xu, MD;  [facility];  Service: Orthopedics;  Laterality: Right;  . JOINT REPLACEMENT        Social History:     Social History   Tobacco Use  . Smoking status: Never Smoker  . Smokeless tobacco: Never Used  Substance Use Topics  . Alcohol use: No  Lives - at home  Mobility - unable to walk currently      Family History :     Family History  Problem Relation Age of Onset  . Dementia Other   . CAD Mother      Home Medications:   Prior to Admission medications   Medication Sig Start Date End Date Taking? Authorizing Provider  acetaminophen (TYLENOL) 325 MG tablet Take 325 mg by mouth every 6 (six) hours as needed for mild pain, moderate pain or headache.    Yes [provider]  calcium-vitamin D (OSCAL WITH D) 500-200 MG-UNIT per tablet Take 1 tablet by mouth daily with breakfast. Patient taking differently: Take 1 tablet by mouth daily.  07/10/14  Yes Tarry KosXu, Naiping M, MD  carvedilol (COREG) 3.125 MG tablet Take 1 tablet (3.125 mg total) by mouth 2 (two) times daily with a meal. 02/11/17  Yes Leroy SeaSingh, Prashant K, MD  ciprofloxacin (CIPRO) 500 MG tablet Take 500 mg by mouth 2 (two) times daily. 11/02/17  Yes [provider]  Docusate Calcium (STOOL SOFTENER PO) Take 1 capsule by mouth as needed (for constipation).   Yes [provider]  furosemide (LASIX) 20 MG tablet Take 1 tablet (20 mg total) by mouth daily. 02/11/17  Yes Leroy SeaSingh, Prashant K, MD    hydrALAZINE (APRESOLINE) 50 MG tablet Take 1.5 tablets (75 mg total) by mouth 3 (three) times daily. 12/26/14  Yes Ames DuraBalleh, Stephen, MD  loratadine (CLARITIN) 10 MG tablet Take 10 mg by mouth daily.   Yes [provider]  OXYGEN Inhale 1 application into the lungs daily as needed.   Yes [provider]  pantoprazole (PROTONIX) 40 MG tablet Take 1 tablet (40 mg total) by mouth daily. 02/22/17  Yes Arrien, York RamMauricio Daniel, MD  Potassium Chloride ER 20 MEQ TBCR Take 20 mEq by mouth daily. 02/11/17  Yes Leroy SeaSingh, Prashant K, MD     Allergies:    No Known Allergies   Physical Exam:   Vitals  Blood pressure (!) 102/41, pulse (!) 36, temperature (!) 96.1 F (35.6 C), temperature source Rectal, resp. rate 15, SpO2 100 %.   1. General  lying in bed in NAD,    2. Normal affect and insight, Not Suicidal or Homicidal, Awake Alert, Oriented X 3.  3. No F.N deficits, ALL C.Nerves Intact, Strength 5/5 all 4 extremities, Sensation intact all 4 extremities, Plantars down going.  4. Ears and Eyes appear Normal, Conjunctivae clear, PERRLA. DRY Oral Mucosa.  5. Supple Neck, No JVD, No cervical lymphadenopathy appriciated, No Carotid Bruits.  6. Symmetrical Chest wall movement, Good air movement bilaterally, bibasilar crackles. No wheezing  7. Brady S1, S2,  No Gallops, Rubs or Murmurs, No Parasternal Heave.  8. Positive Bowel Sounds, Abdomen Soft, No tenderness, No organomegaly appriciated,No rebound -guarding or rigidity.  9.  No Cyanosis, Normal Skin Turgor, No Skin Rash or Bruise.  10. Good muscle tone,  joints appear normal , no effusions, Normal ROM.  11. No Palpable Lymph Nodes in Neck or Axillae    Data Review:    CBC Recent Labs  Lab 11/02/17 1900  WBC 18.7*  HGB 12.9  HCT 43.0  PLT 353  MCV 92.5  MCH 27.7  MCHC 30.0  RDW 14.4  LYMPHSABS 1.5  MONOABS 1.8*  EOSABS 0.0  BASOSABS 0.0    ------------------------------------------------------------------------------------------------------------------  Chemistries  Recent Labs  Lab 11/02/17 1900  NA 139  K 5.1  CL 94*  CO2 40*  GLUCOSE 115*  BUN 40*  CREATININE 1.41*  CALCIUM 9.3   ------------------------------------------------------------------------------------------------------------------ CrCl cannot be calculated (Unknown ideal weight.). ------------------------------------------------------------------------------------------------------------------ No results for input(s): TSH, T4TOTAL, T3FREE, THYROIDAB in the last 72 hours.  Invalid input(s): FREET3  Coagulation profile No results for input(s): INR, PROTIME in the last 168 hours. ------------------------------------------------------------------------------------------------------------------- No results for input(s): DDIMER in the last 72 hours. -------------------------------------------------------------------------------------------------------------------  Cardiac Enzymes Recent Labs  Lab 11/02/17 1900  TROPONINI 0.03*   ------------------------------------------------------------------------------------------------------------------    Component Value Date/Time   BNP 707.1 (H) 11/02/2017 1900     ---------------------------------------------------------------------------------------------------------------  Urinalysis    Component Value Date/Time   COLORURINE YELLOW 05/04/2017 1203   APPEARANCEUR CLEAR 05/04/2017 1203   LABSPEC 1.010 05/04/2017 1203   PHURINE 6.0 05/04/2017 1203   GLUCOSEU NEGATIVE 05/04/2017 1203   HGBUR NEGATIVE 05/04/2017 1203   BILIRUBINUR NEGATIVE 05/04/2017 1203   KETONESUR 5 (A) 05/04/2017 1203   PROTEINUR NEGATIVE 05/04/2017 1203   UROBILINOGEN 0.2 12/22/2014 0903   NITRITE NEGATIVE 05/04/2017 1203   LEUKOCYTESUR NEGATIVE 05/04/2017 1203     ----------------------------------------------------------------------------------------------------------------   Imaging Results:    Dg Chest Port 1 View  Result Date: 11/02/2017 CLINICAL DATA:  Congestive heart failure. Decreased level of consciousness. EXAM: PORTABLE CHEST 1 VIEW COMPARISON:  05/03/2017 FINDINGS: Enlarged cardiac silhouette. Calcific atherosclerotic disease and tortuosity of the aorta. There is no evidence of focal pneumothorax. Persistent nodular density in the right lower lobe and left upper lobe. Moderate right pleural effusion and smaller left pleural effusion. Bilateral lower lobe atelectasis versus airspace consolidation. Osseous structures are without acute abnormality. Soft tissues are grossly normal. IMPRESSION: Persistent nodular density in the right lower lobe and left upper lobe. Moderate right pleural effusion and smaller left pleural effusion. Bilateral lower lobe atelectasis versus airspace consolidation. Overall the aeration of the lungs is slightly worsened than on the prior radiograph. Electronically Signed   By: Ted Mcalpine M.D.   On: 11/02/2017 19:09    High grade 2nd degree block    Assessment & Plan:    Principal Problem:   CAP (community acquired pneumonia) Active Problems:   Bradycardia   CAP Blood culture x2 Urine legionella, urine strep antigen resp viral panel Rocephin iv, zithromax iv  Troponin elevation Trop I q6h x3 Check cardiac echo Start Aspirin , lipitor Check lipid, hga1c  Bradycardia STOP Carvedilol 2nd degree AVB Family doesn't want pacer or intervetion at this time.   Hypertension Cont hydralazine  Gerd Cont protonix  ARF HOLD Lasix Ns iv Check cmp in am  Anemia Check cbc in am        DVT ProphylaxisLovenox - SCDs  AM Labs Ordered, also please review Full Orders  Family Communication: Admission, patients condition and plan of care including tests being ordered have been discussed with  the patient and family, daughter who indicate understanding and agree with the plan and Code Status.  Code Status  DNR  Likely DC to  TBD  Condition CRITICAL , may not survive the nite, reiterated the critical nature of her condition to family. May need CMO  Consults called: none  Admission status: inpatient   Time spent in minutes : 45   Pearson Grippe M.D on 11/02/2017 at 10:47 PM  Between 7am to 7pm - Pager - [phone]   After 7pm go to [email or web address] - password [email or web address]  [facility] - Office  [phone]

## 2017-11-02 NOTE — ED Provider Notes (Signed)
[facility]-EMERGENCY DEPT Provider Note   CSN: [id number] Arrival date & time: 11/02/17  1750     History   Chief Complaint Chief Complaint  Patient presents with  . Bradycardia    HPI [name] Fry is a 83 y.o. female.  The history is provided by the patient and medical records. No language interpreter was used.   [name] Fry is a 83 y.o. female  with a PMH of HTN, HLD, CHF, hyponatremia, prior TIA on hospice care who presents to the Emergency Department with family for change in mental status this morning. Daughter whom she lives with states that yesterday patient was able to feed herself, talked on the phone, etc. She was alert and fairly interactive with family. This morning, she took her home morning medications as usual.  Morning, patient became less interactive.  She was unable to eat her lunch and was not talking with family.  Family reports this is a drastic change in her mental status compared to yesterday.  Hospice came and evaluated the patient home today.  They told family that she may have a upper respiratory infection or urinary tract infection causing symptoms, but did not give any recommendations or treatment plans.  Patient's family states that they were still very concerned after hospice left, therefore called EMS and brought patient to the hospital.  Patient unable to provide much history.  I asked if anything hurt, to which she responded no.  Unable to provide any more history.  Level V caveat applies 2/2 acuity of patient condition.   Past Medical History:  Diagnosis Date  . Arthritis    "arms" (10/13/2016)  . High cholesterol   . Hypertension   . Hyponatremia   . Stroke [facility](HCC) ~ 2015   "swallow difficulties since" (10/13/2016)    Patient Active Problem List   Diagnosis Date Noted  . CAP (community acquired pneumonia) 11/02/2017  . Acute on chronic combined systolic and diastolic CHF (congestive heart failure) (HCC) 05/03/2017  .  GERD (gastroesophageal reflux disease) 05/03/2017  . Mass of right lung 05/03/2017  . Encephalopathy 02/17/2017  . Slurred speech 02/17/2017  . Acute metabolic encephalopathy 02/09/2017  . Pleural effusion   . Goals of care, counseling/discussion   . Palliative care encounter   . Abdominal pain   . Upper GI bleed   . Generalized weakness   . Hyponatremia 10/08/2016  . Acute hyponatremia 10/08/2016  . Chronic anemia 12/23/2014  . Leukocytosis 12/23/2014  . History of pleural effusion 12/23/2014  . Aphasia 12/22/2014  . TIA (transient ischemic attack) 12/22/2014  . History of CVA (cerebrovascular accident) without residual deficits 12/22/2014  . Vitamin D deficiency 12/22/2014  . Allergic rhinitis 12/22/2014  . Osteoporosis 12/22/2014  . Protein-calorie malnutrition, severe (HCC) 07/11/2014  . Femoral neck fracture (HCC) 07/10/2014  . HTN (hypertension) 07/10/2014  . HLD (hyperlipidemia) 07/10/2014  . Closed right hip fracture (HCC) 07/10/2014    Past Surgical History:  Procedure Laterality Date  . ABDOMINAL HERNIA REPAIR    . CATARACT EXTRACTION Right   . HERNIA REPAIR    . HIP ARTHROPLASTY Right 07/10/2014   Procedure: ARTHROPLASTY BIPOLAR HIP;  Surgeon: Cheral AlmasNaiping Michael Xu, MD;  [facility];  Service: Orthopedics;  Laterality: Right;  . JOINT REPLACEMENT      OB History    No data available       Home Medications    Prior to Admission medications   Medication Sig Start Date End Date Taking? Authorizing  Provider  acetaminophen (TYLENOL) 325 MG tablet Take 325 mg by mouth every 6 (six) hours as needed for mild pain, moderate pain or headache.    Yes [provider]  calcium-vitamin D (OSCAL WITH D) 500-200 MG-UNIT per tablet Take 1 tablet by mouth daily with breakfast. Patient taking differently: Take 1 tablet by mouth daily.  07/10/14  Yes Tarry Kos, MD  carvedilol (COREG) 3.125 MG tablet Take 1 tablet (3.125 mg total) by mouth 2 (two) times daily  with a meal. 02/11/17  Yes Leroy Sea, MD  ciprofloxacin (CIPRO) 500 MG tablet Take 500 mg by mouth 2 (two) times daily. 11/02/17  Yes [provider]  Docusate Calcium (STOOL SOFTENER PO) Take 1 capsule by mouth as needed (for constipation).   Yes [provider]  furosemide (LASIX) 20 MG tablet Take 1 tablet (20 mg total) by mouth daily. 02/11/17  Yes Leroy Sea, MD  hydrALAZINE (APRESOLINE) 50 MG tablet Take 1.5 tablets (75 mg total) by mouth 3 (three) times daily. 12/26/14  Yes Ames Dura, MD  loratadine (CLARITIN) 10 MG tablet Take 10 mg by mouth daily.   Yes [provider]  OXYGEN Inhale 1 application into the lungs daily as needed.   Yes [provider]  pantoprazole (PROTONIX) 40 MG tablet Take 1 tablet (40 mg total) by mouth daily. 02/22/17  Yes Arrien, York Ram, MD  Potassium Chloride ER 20 MEQ TBCR Take 20 mEq by mouth daily. 02/11/17  Yes Leroy Sea, MD    Family History Family History  Problem Relation Age of Onset  . Dementia Other   . CAD Neg Hx     Social History Social History   Tobacco Use  . Smoking status: Never Smoker  . Smokeless tobacco: Never Used  Substance Use Topics  . Alcohol use: No  . Drug use: No     Allergies   Patient has no known allergies.   Review of Systems Review of Systems  Unable to perform ROS: Acuity of condition     Physical Exam Updated Vital Signs BP (!) 120/43   Pulse (!) 32   Temp (!) 96.1 F (35.6 C) (Rectal)   Resp 15   SpO2 100%   Physical Exam  Constitutional: She appears well-developed and well-nourished. No distress.  HENT:  Head: Normocephalic and atraumatic.  Eyes: Pupils are equal, round, and reactive to light.  Neck: Neck supple.  Cardiovascular: Normal heart sounds.  No murmur heard. Bradycardic.  Pulmonary/Chest: Effort normal. No respiratory distress.  Abdominal: Soft. She exhibits no distension. There is no tenderness.    Musculoskeletal: She exhibits no edema.  Neurological:  Alert. Oriented to person but otherwise non-verbal with me.  Moves all extremities independently and responds to pain.   Skin: Skin is warm and dry.  Nursing note and vitals reviewed.    ED Treatments / Results  Labs (all labs ordered are listed, but only abnormal results are displayed) Labs Reviewed  CBC WITH DIFFERENTIAL/PLATELET - Abnormal; Notable for the following components:      Result Value   WBC 18.7 (*)    Neutro Abs 15.3 (*)    Monocytes Absolute 1.8 (*)    All other components within normal limits  BASIC METABOLIC PANEL - Abnormal; Notable for the following components:   Chloride 94 (*)    CO2 40 (*)    Glucose, Bld 115 (*)    BUN 40 (*)    Creatinine, Ser 1.41 (*)  GFR calc non Af Amer 31 (*)    GFR calc Af Amer 35 (*)    All other components within normal limits  BRAIN NATRIURETIC PEPTIDE - Abnormal; Notable for the following components:   B Natriuretic Peptide 707.1 (*)    All other components within normal limits  TROPONIN I - Abnormal; Notable for the following components:   Troponin I 0.03 (*)    All other components within normal limits  CULTURE, BLOOD (ROUTINE X 2)  CULTURE, BLOOD (ROUTINE X 2)  URINALYSIS, ROUTINE W REFLEX MICROSCOPIC  TSH  I-STAT CG4 LACTIC ACID, ED  I-STAT CG4 LACTIC ACID, ED    EKG  EKG Interpretation  Date/Time:  Wednesday November 02 2017 18:08:15 EST Ventricular Rate:  34 PR Interval:    QRS Duration: 128 QT Interval:  582 QTC Calculation: 438 R Axis:   -49 Text Interpretation:  Predominant 2:1 AV block IVCD, consider atypical RBBB LVH with IVCD and secondary repol abnrm heart block new since previous  Confirmed by Richardean Canal ([id number]) on 11/02/2017 6:34:39 PM       Radiology Dg Chest Port 1 View  Result Date: 11/02/2017 CLINICAL DATA:  Congestive heart failure. Decreased level of consciousness. EXAM: PORTABLE CHEST 1 VIEW COMPARISON:  05/03/2017  FINDINGS: Enlarged cardiac silhouette. Calcific atherosclerotic disease and tortuosity of the aorta. There is no evidence of focal pneumothorax. Persistent nodular density in the right lower lobe and left upper lobe. Moderate right pleural effusion and smaller left pleural effusion. Bilateral lower lobe atelectasis versus airspace consolidation. Osseous structures are without acute abnormality. Soft tissues are grossly normal. IMPRESSION: Persistent nodular density in the right lower lobe and left upper lobe. Moderate right pleural effusion and smaller left pleural effusion. Bilateral lower lobe atelectasis versus airspace consolidation. Overall the aeration of the lungs is slightly worsened than on the prior radiograph. Electronically Signed   By: Ted Mcalpine M.D.   On: 11/02/2017 19:09    Procedures Procedures (including critical care time)  Medications Ordered in ED Medications  cefTRIAXone (ROCEPHIN) 1 g in dextrose 5 % 50 mL IVPB (not administered)  azithromycin (ZITHROMAX) 500 mg in dextrose 5 % 250 mL IVPB (not administered)     Initial Impression / Assessment and Plan / ED Course  I have reviewed the triage vital signs and the nursing notes.  Pertinent labs & imaging results that were available during my care of the patient were reviewed by me and considered in my medical decision making (see chart for details).    [name] Fry is a 83 y.o. female who presents to ED with family for altered mental status this morning. Family state that patient typically will feed herself and talk on the phone, but she has not been responding much today and would not eat.  Patient mostly nonverbal on exam today.  She is bradycardic in the 30s.  EKG shows 2-1 AV block. Troponin minimally elevated at 0.03.  Chest x-ray shows possible bilateral pneumonia.  White count elevated.  Family reports recent cough congestion as well.  Started on Rocephin and azithromycin for likely pneumonia.  Lab work also  reveals new AK I with creatinine at 1.41.  I spoke with family at length about goals of care.  Patient is a hospice patient.  She is DNR/DNI.  She would not want aggressive measures, however would like IV hydration, antibiotics, etc. Hospitalist consulted who will admit.  Patient seen by and discussed with Dr. Silverio Lay who agrees with treatment plan.  Final Clinical Impressions(s) / ED Diagnoses   Final diagnoses:  Bradycardia  Community acquired pneumonia, unspecified laterality    ED Discharge Orders    None       Icess Bertoni, Chase PicketJaime Pilcher, PA-C 11/02/17 2109    Charlynne PanderYao, David Hsienta, MD 11/02/17 [phone]

## 2017-11-02 NOTE — ED Notes (Signed)
Please call floor nurse for report at [phone]. Number [phone]

## 2017-11-03 ENCOUNTER — Encounter (HOSPITAL_COMMUNITY): Payer: Self-pay | Admitting: Internal Medicine

## 2017-11-03 ENCOUNTER — Inpatient Hospital Stay (HOSPITAL_COMMUNITY): Payer: Medicare Other

## 2017-11-03 DIAGNOSIS — N179 Acute kidney failure, unspecified: Secondary | ICD-10-CM

## 2017-11-03 LAB — RESPIRATORY PANEL BY PCR
Adenovirus: NOT DETECTED
BORDETELLA PERTUSSIS-RVPCR: NOT DETECTED
Chlamydophila pneumoniae: NOT DETECTED
Coronavirus 229E: NOT DETECTED
Coronavirus HKU1: NOT DETECTED
Coronavirus NL63: NOT DETECTED
Coronavirus OC43: NOT DETECTED
INFLUENZA B-RVPPCR: NOT DETECTED
Influenza A: NOT DETECTED
METAPNEUMOVIRUS-RVPPCR: NOT DETECTED
Mycoplasma pneumoniae: NOT DETECTED
PARAINFLUENZA VIRUS 2-RVPPCR: NOT DETECTED
PARAINFLUENZA VIRUS 3-RVPPCR: NOT DETECTED
Parainfluenza Virus 1: NOT DETECTED
Parainfluenza Virus 4: NOT DETECTED
RESPIRATORY SYNCYTIAL VIRUS-RVPPCR: NOT DETECTED
RHINOVIRUS / ENTEROVIRUS - RVPPCR: NOT DETECTED

## 2017-11-03 LAB — TSH: TSH: 4.034 u[IU]/mL (ref 0.350–4.500)

## 2017-11-03 MED ORDER — CHLORHEXIDINE GLUCONATE 0.12 % MT SOLN
15.0000 mL | Freq: Two times a day (BID) | OROMUCOSAL | Status: DC
  Administered 2017-11-03: 15 mL via OROMUCOSAL

## 2017-11-03 MED ORDER — ORAL CARE MOUTH RINSE
15.0000 mL | Freq: Two times a day (BID) | OROMUCOSAL | Status: DC
  Administered 2017-11-03: 15 mL via OROMUCOSAL

## 2017-11-03 MED ORDER — PANTOPRAZOLE SODIUM 40 MG IV SOLR
40.0000 mg | INTRAVENOUS | Status: DC
  Administered 2017-11-04 – 2017-11-05 (×2): 40 mg via INTRAVENOUS
  Filled 2017-11-03 (×2): qty 40

## 2017-11-03 MED ORDER — DEXTROSE-NACL 5-0.9 % IV SOLN
INTRAVENOUS | Status: DC
  Administered 2017-11-03 – 2017-11-05 (×2): via INTRAVENOUS

## 2017-11-03 NOTE — Progress Notes (Signed)
Nutrition Brief Note  Patient identified on the Malnutrition Screening Tool (MST) Report  Palliative to see patient given that pt was admitted from home with hospice services. Per order to palliative, family may be interested in comfort measures for patient.  RD will monitor for decisions.  Wt Readings from Last 15 Encounters:  11/02/17 94 lb (42.6 kg)  05/06/17 94 lb 12.8 oz (43 kg)  02/21/17 99 lb 3.3 oz (45 kg)  02/09/17 100 lb 12 oz (45.7 kg)  10/11/16 101 lb 12.8 oz (46.2 kg)  12/26/14 110 lb (49.9 kg)  07/12/14 96 lb 14.4 oz (44 kg)    Body mass index is 20.34 kg/m. Patient meets criteria for normal based on current BMI.   Current diet order is NPO. Labs and medications reviewed.   No nutrition interventions warranted at this time. If nutrition interventions appropriate given code status, please consult RD.   Tilda FrancoLindsey Lorenna Lurry, MS, RD, LDN [facility] Clinical Dietitian Pager: [phone] After Hours Pager: [phone]

## 2017-11-03 NOTE — Consult Note (Signed)
   [facility]   11/03/2017  [name] 03/12/1920 [id number]    [facility].  Chart reviewed and spoke with patient's nurse.   Mrs. Tiburcio PeaHarris is not [facility] at this time. She is likely going to be under hospice services. Palliative Medicine consult pending.   Raiford NobleAtika Gizel Riedlinger, MSN-Ed, RN,BSN [facility] Hospital Liaison [phone]

## 2017-11-03 NOTE — Progress Notes (Signed)
Family refuses any further blood work at this time.

## 2017-11-03 NOTE — Progress Notes (Signed)
Palliative Medicine RN Note: Consult order noted. [facility] was seen yesterday prior to status change and visit to ED. Community does not have a Community education officercontract with [facility]; Community rep stated that [facility] as she was admitted to a non-contracted hospital.   We will place [name] BryantAlice Fry on our consult list for provider evaluation/visit.  Margret ChanceMelanie G. Mazey Mantell, RN, BSN, [facility] 11/03/2017 10:28 AM Cell [phone] 8:00-4:00 Monday-Friday Office [phone]

## 2017-11-03 NOTE — Progress Notes (Addendum)
PROGRESS NOTE    [name] Fry  OZH:[id number] DOB: 11-08-1920 DOA: 11/02/2017 PCP: Wilmer Floorampbell, Stephen D., MD   Brief Narrative: Patient is a 83 year old female with past medical history of hypertension, hyperlipidemia, CVA from home who was admitted yesterday for the management of community-acquired pneumonia.  Currently family is interested on making her comfortable only and want to discuss about hospice.  Assessment & Plan:   Principal Problem:   CAP (community acquired pneumonia) Active Problems:   Protein-calorie malnutrition, severe (HCC)   Leukocytosis   Bradycardia  Community-acquired pneumonia: Multifocal pneumonia.  Chest x-ray showed persistent nodular density in the right lower lobe and left upper lobe .  Also showed moderate right pleural effusion and a small left pleural effusion.  Patient is from home so we started her on ceftriaxone and azithromycin. Currently family are interested on continue antibiotics and fluids. Blood cultures have been sent. Patient is currently n.p.o. as she is unable to swallow.  We will continue gentle IV fluids with DNS.  Patient is a DNI/DNR.  Family refused any blood work today.  We have requested for palliative care evaluation and social worker consult for assisting on hospice services.  Bradycardia: Patient was on carvedilol at home which has been stopped.  EKG shows secondary AV block.  Severe   Bradycardia with heart rates ranging 30-40 .  Family does not want patient/intervention.  Hypertension: Currently blood pressure stable.  On hydralazine.  Will discontinue hydralazine because her pressure is on the lower side today.  Anemia: Currently H&H is stable.  We will not transfuse given patient's status.Marland Kitchen.  GERD: On Protonix.  Troponin elevation:Very mild . Most likely secondary to supply demand ischemia.  Echocardiogram has been ordered by admitting physician.   Acute kidney injury: Likely prerenal.  We will continue gentle IV  fluids      DVT prophylaxis: SCD Code Status: DNR/DNI Family Communication:  daughter, grandson Son  disposition Plan:Hospice house versus home with hospice  Consultants:   Palliative care  Procedures: None  Antimicrobials: Ceftriaxone and azithromycin started on 11/02/17   Subjective: Patient seen and examined the bedside this morning.  She was unresponsive.  Responds with facial grimace with pain only.   Objective: Vitals:   11/02/17 2124 11/02/17 2317 11/02/17 2331 11/03/17 0602  BP: (!) 102/41 (!) 99/40  (!) 108/46  Pulse: (!) 36 (!) 31  (!) 30  Resp: 15 18    Temp:      TempSrc:      SpO2: 100% 100%  100%  Weight:   42.6 kg (94 lb)   Height:   4\' 9"  (1.448 m)     Intake/Output Summary (Last 24 hours) at 11/03/2017 1219 Last data filed at 11/02/2017 2210 Gross per 24 hour  Intake 50 ml  Output -  Net 50 ml   Filed Weights   11/02/17 2331  Weight: 42.6 kg (94 lb)    Examination:  General exam: Not alert or oriented, unresponsive, Respiratory system: Bilateral decreased air entry, shallow breathing Cardiovascular system: Severe bradycardia, no JVD, murmurs, rubs, gallops or clicks. No pedal edema. Gastrointestinal system: Abdomen is nondistended, soft and nontender. No organomegaly or masses felt. Normal bowel sounds heard. Central nervous system:Not  Alert and oriented. Extremities: Neurologic examination not done. Skin: No rashes, lesions or ulcers Psychiatry: Not examined     Data Reviewed: I have personally reviewed following labs and imaging studies  CBC: Recent Labs  Lab 11/02/17 1900  WBC 18.7*  NEUTROABS 15.3*  HGB  12.9  HCT 43.0  MCV 92.5  PLT 353   Basic Metabolic Panel: Recent Labs  Lab 11/02/17 1900  NA 139  K 5.1  CL 94*  CO2 40*  GLUCOSE 115*  BUN 40*  CREATININE 1.41*  CALCIUM 9.3   GFR: Estimated Creatinine Clearance: 14.5 mL/min (A) (by C-G formula based on SCr of 1.41 mg/dL (H)). Liver Function Tests: No  results for input(s): AST, ALT, ALKPHOS, BILITOT, PROT, ALBUMIN in the last 168 hours. No results for input(s): LIPASE, AMYLASE in the last 168 hours. No results for input(s): AMMONIA in the last 168 hours. Coagulation Profile: No results for input(s): INR, PROTIME in the last 168 hours. Cardiac Enzymes: Recent Labs  Lab 11/02/17 1900  TROPONINI 0.03*   BNP (last 3 results) No results for input(s): PROBNP in the last 8760 hours. HbA1C: No results for input(s): HGBA1C in the last 72 hours. CBG: No results for input(s): GLUCAP in the last 168 hours. Lipid Profile: No results for input(s): CHOL, HDL, LDLCALC, TRIG, CHOLHDL, LDLDIRECT in the last 72 hours. Thyroid Function Tests: Recent Labs    11/02/17 2337  TSH 4.034   Anemia Panel: No results for input(s): VITAMINB12, FOLATE, FERRITIN, TIBC, IRON, RETICCTPCT in the last 72 hours. Sepsis Labs: No results for input(s): PROCALCITON, LATICACIDVEN in the last 168 hours.  No results found for this or any previous visit (from the past 240 hour(s)).       Radiology Studies: Dg Chest Port 1 View  Result Date: 11/02/2017 CLINICAL DATA:  Congestive heart failure. Decreased level of consciousness. EXAM: PORTABLE CHEST 1 VIEW COMPARISON:  05/03/2017 FINDINGS: Enlarged cardiac silhouette. Calcific atherosclerotic disease and tortuosity of the aorta. There is no evidence of focal pneumothorax. Persistent nodular density in the right lower lobe and left upper lobe. Moderate right pleural effusion and smaller left pleural effusion. Bilateral lower lobe atelectasis versus airspace consolidation. Osseous structures are without acute abnormality. Soft tissues are grossly normal. IMPRESSION: Persistent nodular density in the right lower lobe and left upper lobe. Moderate right pleural effusion and smaller left pleural effusion. Bilateral lower lobe atelectasis versus airspace consolidation. Overall the aeration of the lungs is slightly worsened  than on the prior radiograph. Electronically Signed   By: Ted Mcalpineobrinka  Dimitrova M.D.   On: 11/02/2017 19:09        Scheduled Meds: . chlorhexidine  15 mL Mouth Rinse BID  . hydrALAZINE  75 mg Oral TID  . mouth rinse  15 mL Mouth Rinse q12n4p  . [START ON 11/04/2017] pantoprazole (PROTONIX) IV  40 mg Intravenous Q24H   Continuous Infusions: . azithromycin    . cefTRIAXone (ROCEPHIN)  IV    . dextrose 5 % and 0.9% NaCl       LOS: 1 day    Time spent: 25 minutes    [name] Delsignore Salli QuarryAdhikari BK, MD [facility] Pager [phone]  If 7PM-7AM, please contact night-coverage [email or web address] Password [email or web address] 11/03/2017, 12:19 PM

## 2017-11-04 DIAGNOSIS — Z7189 Other specified counseling: Secondary | ICD-10-CM

## 2017-11-04 DIAGNOSIS — Z515 Encounter for palliative care: Secondary | ICD-10-CM

## 2017-11-04 NOTE — Consult Note (Signed)
Consultation Note Date: 11/04/2017   Patient Name: [name] Fry  DOB: 02-15-1920  MRN: [id number]  Age / Sex: 83 y.o., female  PCP: Wilmer Floor., MD Referring Physician: Meredith Leeds, MD  Reason for Consultation: Establishing goals of care  HPI/Patient Profile: 83 y.o. female  admitted on 11/02/2017    Clinical Assessment and Goals of Care:  83 yo with HTN HLD CVA admitted with CAP, multi focal PNA. She is currently NPO, is unresponsive, she was reportedly connected with community hospice, she has been admitted to hospital medicine service, she is on IVF and IV Abx, palliative consult for clarifying goals of care.   The patient is resting in bed, she has a facial droop, is unresponsive, does not appear to have non verbal gestures of distress or discomfort, does not open eyes when name is called, does not follow commands.   Call placed to daughter [name] Fry and also discussed with another daughter [name] Fry. I introduced myself and palliative care as follows: Palliative medicine is specialized medical care for people living with serious illness. It focuses on providing relief from the symptoms and stress of a serious illness. The goal is to improve quality of life for both the patient and the family.  The patient reportedly has been with community hospice for a year, she was seen by palliative care November 2017 for confusion, weakness, possible GIB. At that time, the family elected DNR, elected no EGD, elected no blood transfusion, continue medication management.   The patient is cared for at home, by her family, they live in [place]. She was less responsive and was diagnosed with pneumonia in the ED, admitted to hospitalist service, she continues to be bradycardic.   I reviewed end of life signs and symptoms in detail with daughter over the phone, I discussed about comfort care and  giving consideration to her care being continued at a residential hospice setting. See discussions and recommendations below, thank you for the consult.   HCPOA  2 daughters are co HCPOA agents, see below.     SUMMARY OF RECOMMENDATIONS   Agree with DNR DNI Recommend full scope of comfort measures Call placed and discussed with HCPOA daughters [name] Fry and [name] Fry regarding the patient's condition and residential hospice.  CSW consult to meet with family this afternoon for finalizing residential hospice preferences.  Continue current care for now Thank you for the consult.   Code Status/Advance Care Planning:  DNR    Symptom Management:      Palliative Prophylaxis:   Bowel Regimen  Psycho-social/Spiritual:   Desire for further Chaplaincy support:yes  Additional Recommendations: Education on Hospice  Prognosis:   Hours - Days  Discharge Planning: Hospice facility      Primary Diagnoses: Present on Admission: . CAP (community acquired pneumonia) . Bradycardia . Protein-calorie malnutrition, severe (HCC) . Leukocytosis   I have reviewed the medical record, interviewed the patient and family, and examined the patient. The following aspects are pertinent.  Past Medical History:  Diagnosis Date  .  Arthritis    "arms" (10/13/2016)  . High cholesterol   . Hypertension   . Hyponatremia   . Stroke [facility](HCC) ~ 2015   "swallow difficulties since" (10/13/2016)   Social History   Socioeconomic History  . Marital status: Widowed    Spouse name: None  . Number of children: None  . Years of education: None  . Highest education level: None  Social Needs  . Financial resource strain: None  . Food insecurity - worry: None  . Food insecurity - inability: None  . Transportation needs - medical: None  . Transportation needs - non-medical: None  Occupational History  . None  Tobacco Use  . Smoking status: Never Smoker  . Smokeless tobacco: Never Used  Substance  and Sexual Activity  . Alcohol use: No  . Drug use: No  . Sexual activity: None  Other Topics Concern  . None  Social History Narrative  . None   Family History  Problem Relation Age of Onset  . Dementia Other   . CAD Mother    Scheduled Meds: . chlorhexidine  15 mL Mouth Rinse BID  . mouth rinse  15 mL Mouth Rinse q12n4p  . pantoprazole (PROTONIX) IV  40 mg Intravenous Q24H   Continuous Infusions: . azithromycin Stopped (11/03/17 2244)  . cefTRIAXone (ROCEPHIN)  IV Stopped (11/03/17 2244)  . dextrose 5 % and 0.9% NaCl 75 mL/hr at 11/03/17 1230   PRN Meds:. Medications Prior to Admission:  Prior to Admission medications   Medication Sig Start Date End Date Taking? Authorizing Provider  acetaminophen (TYLENOL) 325 MG tablet Take 325 mg by mouth every 6 (six) hours as needed for mild pain, moderate pain or headache.    Yes [provider]  calcium-vitamin D (OSCAL WITH D) 500-200 MG-UNIT per tablet Take 1 tablet by mouth daily with breakfast. Patient taking differently: Take 1 tablet by mouth daily.  07/10/14  Yes Tarry KosXu, Naiping M, MD  carvedilol (COREG) 3.125 MG tablet Take 1 tablet (3.125 mg total) by mouth 2 (two) times daily with a meal. 02/11/17  Yes Leroy SeaSingh, Prashant K, MD  ciprofloxacin (CIPRO) 500 MG tablet Take 500 mg by mouth 2 (two) times daily. 11/02/17  Yes [provider]  Docusate Calcium (STOOL SOFTENER PO) Take 1 capsule by mouth as needed (for constipation).   Yes [provider]  furosemide (LASIX) 20 MG tablet Take 1 tablet (20 mg total) by mouth daily. 02/11/17  Yes Leroy SeaSingh, Prashant K, MD  hydrALAZINE (APRESOLINE) 50 MG tablet Take 1.5 tablets (75 mg total) by mouth 3 (three) times daily. 12/26/14  Yes Ames DuraBalleh, Stephen, MD  loratadine (CLARITIN) 10 MG tablet Take 10 mg by mouth daily.   Yes [provider]  OXYGEN Inhale 1 application into the lungs daily as needed.   Yes [provider]  pantoprazole (PROTONIX) 40 MG tablet  Take 1 tablet (40 mg total) by mouth daily. 02/22/17  Yes Arrien, York RamMauricio Daniel, MD  Potassium Chloride ER 20 MEQ TBCR Take 20 mEq by mouth daily. 02/11/17  Yes Leroy SeaSingh, Prashant K, MD   No Known Allergies Review of Systems Unresponsive   Physical Exam Elderly lady resting in bed [facility] Shallow regular pattern of breathing Thin extremities Abdomen soft Facial droop likely chronic In no distress  Vital Signs: BP (!) 136/46 (BP Location: Right Arm)   Pulse (!) 48   Temp 98.7 F (37.1 C) (Oral)   Resp 20   Ht 4\' 9"  (1.448 m)  Wt 42.6 kg (94 lb)   SpO2 100%   BMI 20.34 kg/m  Pain Assessment: Faces       SpO2: SpO2: 100 % O2 Device:SpO2: 100 % O2 Flow Rate: .   IO: Intake/output summary:   Intake/Output Summary (Last 24 hours) at 11/04/2017 1102 Last data filed at 11/04/2017 0600 Gross per 24 hour  Intake 1612.5 ml  Output 2 ml  Net 1610.5 ml    LBM:   Baseline Weight: Weight: 42.6 kg (94 lb) Most recent weight: Weight: 42.6 kg (94 lb)     Palliative Assessment/Data:     Time In:  10 Time Out:  11.10 Time Total:  70 min  Greater than 50%  of this time was spent counseling and coordinating care related to the above assessment and plan.  Signed by: Rosalin HawkingZeba Jerell Demery, MD  [phone]  Please contact Palliative Medicine Team phone at [phone] for questions and concerns.  For individual provider: See Loretha StaplerAmion

## 2017-11-04 NOTE — Plan of Care (Signed)
Palliative contacted to inform of plans to possibly DC today. Asked to round when able.

## 2017-11-04 NOTE — Progress Notes (Signed)
PROGRESS NOTE    [name] Fry  ZOX:[id number] DOB: 07-05-1920 DOA: 11/02/2017 PCP: Wilmer Floor., MD   Brief Narrative: Patient is a 83 year old female with past medical history of hypertension, hyperlipidemia, CVA from home who was brought from home after she became less responsive and was altered.She has been found to have  community acquired pneumonia.  Currently she is awaiting for transfer to hospice facility after final family discussion.  Assessment & Plan:   Principal Problem:   CAP (community acquired pneumonia) Active Problems:   Protein-calorie malnutrition, severe (HCC)   Leukocytosis   Encounter for palliative care   Bradycardia   AKI (acute kidney injury) (HCC)  Community-acquired pneumonia: Multifocal.  Chest x-ray showed persistent nodular density in the right lower lobe and left upper lobe.  Also showed moderate right-sided pleural effusion and a small left pleural effusion .Currently on ceftriaxone and azithromycin. Patient is currently n.p.o. as she is unable to swallow.  On gentle IV fluids with DNS.  Due to patient's poor prognosis, family are interested on pursuing hospice services.  Patient was reported to have been already enrolled in home hospice services while she was at home .  Palliative service has evaluated the patient here .Social worker on board. Patient will be discharged to hospice facility after final family agreement.  Patient's family were unable to gather before 5 PM today, so the transfer to hospice facility can take place only tomorrow.  Bradycardia: Severe bradycardia.  Patient was on carvedilol at home which has been stopped.  EKG showed secondary AV block.  Heart rate ranging from 30-40.  Family does not want any intervention.  Hypertension: Currently blood pressure stable.Not on any meds for now.  Acute kidney injury: Likely prerenal secondary to dehydration.  Gentle IV fluids.  No blood work will be done as per family's  request       DVT prophylaxis: SCD Code Status: DNI/DNR Family Communication: Many members were not present at the bedside. Disposition Plan: Hospice facility  Consultants:   Palliative care  Procedures: None  Antimicrobials: Ceftriaxone and azithromycin started on 11/02/17.  Subjective: Patient seen and examined at bedside this morning.  Continues to remain unresponsive remains bradycardic. Patient evaluated by social worker and palliative provided today.  Plan is to discharge her to hospice facility tomorrow.  Objective: Vitals:   11/03/17 1431 11/03/17 2123 11/04/17 0500 11/04/17 1338  BP: (!) 134/35 (!) 139/41 (!) 136/46 (!) 134/45  Pulse: (!) 48 61 (!) 48 (!) 33  Resp: (!) 26 (!) 24 20 20   Temp: (!) 97.4 F (36.3 C) (!) 97.4 F (36.3 C) 98.7 F (37.1 C) 98.4 F (36.9 C)  TempSrc: Axillary Axillary Oral Axillary  SpO2: 97% 91% 100% 100%  Weight:      Height:        Intake/Output Summary (Last 24 hours) at 11/04/2017 1512 Last data filed at 11/04/2017 0600 Gross per 24 hour  Intake 1612.5 ml  Output 2 ml  Net 1610.5 ml   Filed Weights   11/02/17 2331  Weight: 42.6 kg (94 lb)    Examination:  General exam: Unresponsive, obtunded Respiratory system: Bilateral decreased air entry, celebrating Cardiovascular system: Severe bradycardia  gastrointestinal system: Abdomen is nondistended, soft and nontender. No organomegaly or masses felt.  Central nervous system: Not Alert and oriented,facial droop Extremities: No edema Neuro:Examination not done Skin: No rashes, lesions or ulcers Psychiatry: Patient unable to participate    Data Reviewed: I have personally reviewed following labs and imaging  studies  CBC: Recent Labs  Lab 11/02/17 1900  WBC 18.7*  NEUTROABS 15.3*  HGB 12.9  HCT 43.0  MCV 92.5  PLT 353   Basic Metabolic Panel: Recent Labs  Lab 11/02/17 1900  NA 139  K 5.1  CL 94*  CO2 40*  GLUCOSE 115*  BUN 40*  CREATININE 1.41*   CALCIUM 9.3   GFR: Estimated Creatinine Clearance: 14.5 mL/min (A) (by C-G formula based on SCr of 1.41 mg/dL (H)). Liver Function Tests: No results for input(s): AST, ALT, ALKPHOS, BILITOT, PROT, ALBUMIN in the last 168 hours. No results for input(s): LIPASE, AMYLASE in the last 168 hours. No results for input(s): AMMONIA in the last 168 hours. Coagulation Profile: No results for input(s): INR, PROTIME in the last 168 hours. Cardiac Enzymes: Recent Labs  Lab 11/02/17 1900  TROPONINI 0.03*   BNP (last 3 results) No results for input(s): PROBNP in the last 8760 hours. HbA1C: No results for input(s): HGBA1C in the last 72 hours. CBG: No results for input(s): GLUCAP in the last 168 hours. Lipid Profile: No results for input(s): CHOL, HDL, LDLCALC, TRIG, CHOLHDL, LDLDIRECT in the last 72 hours. Thyroid Function Tests: Recent Labs    11/02/17 2337  TSH 4.034   Anemia Panel: No results for input(s): VITAMINB12, FOLATE, FERRITIN, TIBC, IRON, RETICCTPCT in the last 72 hours. Sepsis Labs: No results for input(s): PROCALCITON, LATICACIDVEN in the last 168 hours.  Recent Results (from the past 240 hour(s))  Blood culture (routine x 2)     Status: None (Preliminary result)   Collection Time: 11/02/17 11:37 PM  Result Value Ref Range Status   Specimen Description BLOOD LEFT HAND  Final   Special Requests IN PEDIATRIC BOTTLE Blood Culture adequate volume  Final   Culture   Final    NO GROWTH 1 DAY Performed at [facility], [address] [place], [place] [address]    Report Status PENDING  Incomplete  Blood culture (routine x 2)     Status: None (Preliminary result)   Collection Time: 11/02/17 11:37 PM  Result Value Ref Range Status   Specimen Description BLOOD RIGHT HAND  Final   Special Requests IN PEDIATRIC BOTTLE Blood Culture adequate volume  Final   Culture   Final    NO GROWTH 1 DAY Performed at [facility], [address] [place], [place] [address]     Report Status PENDING  Incomplete  Respiratory Panel by PCR     Status: None   Collection Time: 11/03/17  3:00 AM  Result Value Ref Range Status   Adenovirus NOT DETECTED NOT DETECTED Final   Coronavirus 229E NOT DETECTED NOT DETECTED Final   Coronavirus HKU1 NOT DETECTED NOT DETECTED Final   Coronavirus NL63 NOT DETECTED NOT DETECTED Final   Coronavirus OC43 NOT DETECTED NOT DETECTED Final   Metapneumovirus NOT DETECTED NOT DETECTED Final   Rhinovirus / Enterovirus NOT DETECTED NOT DETECTED Final   Influenza A NOT DETECTED NOT DETECTED Final   Influenza B NOT DETECTED NOT DETECTED Final   Parainfluenza Virus 1 NOT DETECTED NOT DETECTED Final   Parainfluenza Virus 2 NOT DETECTED NOT DETECTED Final   Parainfluenza Virus 3 NOT DETECTED NOT DETECTED Final   Parainfluenza Virus 4 NOT DETECTED NOT DETECTED Final   Respiratory Syncytial Virus NOT DETECTED NOT DETECTED Final   Bordetella pertussis NOT DETECTED NOT DETECTED Final   Chlamydophila pneumoniae NOT DETECTED NOT DETECTED Final   Mycoplasma pneumoniae NOT DETECTED NOT  DETECTED Final    Comment: Performed at [facility], [address] [place], [place] [address]         Radiology Studies: Dg Chest Port 1 View  Result Date: 11/02/2017 CLINICAL DATA:  Congestive heart failure. Decreased level of consciousness. EXAM: PORTABLE CHEST 1 VIEW COMPARISON:  05/03/2017 FINDINGS: Enlarged cardiac silhouette. Calcific atherosclerotic disease and tortuosity of the aorta. There is no evidence of focal pneumothorax. Persistent nodular density in the right lower lobe and left upper lobe. Moderate right pleural effusion and smaller left pleural effusion. Bilateral lower lobe atelectasis versus airspace consolidation. Osseous structures are without acute abnormality. Soft tissues are grossly normal. IMPRESSION: Persistent nodular density in the right lower lobe and left upper lobe. Moderate right pleural effusion and smaller left pleural  effusion. Bilateral lower lobe atelectasis versus airspace consolidation. Overall the aeration of the lungs is slightly worsened than on the prior radiograph. Electronically Signed   By: Ted Mcalpineobrinka  Dimitrova M.D.   On: 11/02/2017 19:09        Scheduled Meds: . chlorhexidine  15 mL Mouth Rinse BID  . mouth rinse  15 mL Mouth Rinse q12n4p  . pantoprazole (PROTONIX) IV  40 mg Intravenous Q24H   Continuous Infusions: . azithromycin Stopped (11/03/17 2244)  . cefTRIAXone (ROCEPHIN)  IV Stopped (11/03/17 2244)  . dextrose 5 % and 0.9% NaCl 75 mL/hr at 11/03/17 1230     LOS: 2 days    Time spent: 25 mins    Chika Cichowski Salli QuarryAdhikari BK, MD [facility] Pager [phone]  If 7PM-7AM, please contact night-coverage [email or web address] Password [email or web address] 11/04/2017, 3:12 PM

## 2017-11-04 NOTE — Progress Notes (Signed)
[facility] not have residential [facility]. Pt now for residential hospice home.

## 2017-11-04 NOTE — Progress Notes (Signed)
CSW received consult to meet with patient's daughter regarding interest in hospice. CSW spoke with patient's daughter Ivette LoyalGwynn and granddaughter at bedside. Patient's family reported interested in residential hospice specifically [facility] or [facility]. Patient's family reported that [facility] closer and agreed to referral. CSW made referral to [facility]. Hospital Liaison came to see patient's daughter who scheduled a meeting with additional family members for today at [facility]6PM. Patient's family will meet with [facility] RN at [facility]. CSW will continue to follow and assist with discharge planning.  Celso SickleKimberly Haroon Shatto, [place] Clinical Social Worker [facility] Cell#: [phone]

## 2017-11-04 NOTE — Progress Notes (Signed)
At this time daughter Dedra SkeensGwen has requested not to have labs drawn due to comfort measures, this includes AM labs. Will continue to monitor patient closely

## 2017-11-05 DIAGNOSIS — N179 Acute kidney failure, unspecified: Secondary | ICD-10-CM

## 2017-11-05 DIAGNOSIS — E43 Unspecified severe protein-calorie malnutrition: Secondary | ICD-10-CM

## 2017-11-05 MED ORDER — LORATADINE 10 MG PO TABS: 10.0000 mg | ORAL_TABLET | Freq: Every day | ORAL | Status: AC | PRN

## 2017-11-05 NOTE — Progress Notes (Signed)
LCSW following for transition to Hospice Home: [facility]  Confirmed with Aundra MilletMegan at [facility] that patient is ready to transfer. DC summary sent and orders completed.  RN calling reports and setting up EMS. NO other needs. [facility] to [facility].  RN calling family as family wants to know when EMS is on unit to accept patient.  Deretha EmoryHannah Prestin Munch LCSW, MSW Clinical Social Work: Optician, dispensingystem Wide Float Coverage for :  [phone]

## 2017-11-05 NOTE — Discharge Summary (Signed)
Physician Discharge Summary  [name] Fry ZOX:[id number] DOB: 1920-05-24 DOA: 11/02/2017  PCP: Wilmer Floorampbell, Stephen D., MD  Admit date: 11/02/2017 Discharge date: 11/05/2017  Admitted From: Home Disposition: Residential Hospice   Recommendations for Outpatient Follow-up:  1. Symptom management at end of life  Home Health: N/A Equipment/Devices: Per hospice Discharge Condition: Poor, extremely guarded prognosis CODE STATUS: DNR Diet recommendation: NPO for inability to swallow  Brief/Interim Summary: [name] Fry is a 83 y.o. female with a history of HTN, HLD, and CVA who presented with diminished responsiveness and altered mental status found to have community acquired pneumonia. Antibiotics were given with little overall improvement. Severe bradycardia was noted, and after goals of care discussions with the family, the decision has been made to pursue comfort measures only and to discharge to hospice.   Discharge Diagnoses:  Principal Problem:   CAP (community acquired pneumonia) Active Problems:   Protein-calorie malnutrition, severe (HCC)   Leukocytosis   Encounter for palliative care   Bradycardia   AKI (acute kidney injury) (HCC)  Community-acquired pneumonia: Multifocal.  Chest x-ray showed persistent nodular density in the right lower lobe and left upper lobe.  Also showed moderate right-sided pleural effusion and a small left pleural effusion .Currently on ceftriaxone and azithromycin, will stop given comfort measures.  - Due to patient's poor prognosis, family are interested on pursuing hospice services.  Patient was reported to have been already enrolled in home hospice services while she was at home .  Palliative service has evaluated the patient here .Social worker on board. Patient will be discharged to hospice facility after final family agreement.    Bradycardia: Severe bradycardia.  Patient was on carvedilol at home which has been stopped.  EKG showed secondary  AV block.  Heart rate ranging from 30-40.   - Family does not want any intervention.  Hypertension: Currently blood pressure stable. Not on any meds for now.  Acute kidney injury: Likely prerenal secondary to dehydration.  Gentle IV fluids.   - No blood work will be done as per family's request  Discharge Instructions  Allergies as of 11/05/2017   No Known Allergies     Medication List    STOP taking these medications   calcium-vitamin D 500-200 MG-UNIT tablet Commonly known as:  OSCAL WITH D   carvedilol 3.125 MG tablet Commonly known as:  COREG   ciprofloxacin 500 MG tablet Commonly known as:  CIPRO   furosemide 20 MG tablet Commonly known as:  LASIX   hydrALAZINE 50 MG tablet Commonly known as:  APRESOLINE   OXYGEN   pantoprazole 40 MG tablet Commonly known as:  PROTONIX   Potassium Chloride ER 20 MEQ Tbcr   STOOL SOFTENER PO     TAKE these medications   acetaminophen 325 MG tablet Commonly known as:  TYLENOL Take 325 mg by mouth every 6 (six) hours as needed for mild pain, moderate pain or headache.   loratadine 10 MG tablet Commonly known as:  CLARITIN Take 1 tablet (10 mg total) by mouth daily as needed for allergies. What changed:    when to take this  reasons to take this       No Known Allergies  Consultations:  Palliative care  Procedures/Studies: Dg Chest Port 1 View  Result Date: 11/02/2017 CLINICAL DATA:  Congestive heart failure. Decreased level of consciousness. EXAM: PORTABLE CHEST 1 VIEW COMPARISON:  05/03/2017 FINDINGS: Enlarged cardiac silhouette. Calcific atherosclerotic disease and tortuosity of the aorta. There is no evidence of focal  pneumothorax. Persistent nodular density in the right lower lobe and left upper lobe. Moderate right pleural effusion and smaller left pleural effusion. Bilateral lower lobe atelectasis versus airspace consolidation. Osseous structures are without acute abnormality. Soft tissues are grossly  normal. IMPRESSION: Persistent nodular density in the right lower lobe and left upper lobe. Moderate right pleural effusion and smaller left pleural effusion. Bilateral lower lobe atelectasis versus airspace consolidation. Overall the aeration of the lungs is slightly worsened than on the prior radiograph. Electronically Signed   By: Ted Mcalpineobrinka  Dimitrova M.D.   On: 11/02/2017 19:09     Subjective: Patient not responsive. Family not at bedside but has decided on discharge to hospice facility.   Discharge Exam: Vitals:   11/04/17 2152 11/05/17 0401  BP: (!) 144/39 (!) 143/48  Pulse: (!) 36 (!) 38  Resp: (!) 22 (!) 22  Temp: 98.2 F (36.8 C) 99.2 F (37.3 C)  SpO2: 100% 100%   General: Obtunded, unresponsive.  Cardiovascular: Severe bradycardia Respiratory: Nonlabored, decreased air entry  Labs: BNP (last 3 results) Recent Labs    02/17/17 1358 05/03/17 1934 11/02/17 1900  BNP 337.5* 527.4* 707.1*   Basic Metabolic Panel: Recent Labs  Lab 11/02/17 1900  NA 139  K 5.1  CL 94*  CO2 40*  GLUCOSE 115*  BUN 40*  CREATININE 1.41*  CALCIUM 9.3   Liver Function Tests: No results for input(s): AST, ALT, ALKPHOS, BILITOT, PROT, ALBUMIN in the last 168 hours. No results for input(s): LIPASE, AMYLASE in the last 168 hours. No results for input(s): AMMONIA in the last 168 hours. CBC: Recent Labs  Lab 11/02/17 1900  WBC 18.7*  NEUTROABS 15.3*  HGB 12.9  HCT 43.0  MCV 92.5  PLT 353   Cardiac Enzymes: Recent Labs  Lab 11/02/17 1900  TROPONINI 0.03*   BNP: Invalid input(s): POCBNP CBG: No results for input(s): GLUCAP in the last 168 hours. D-Dimer No results for input(s): DDIMER in the last 72 hours. Hgb A1c No results for input(s): HGBA1C in the last 72 hours. Lipid Profile No results for input(s): CHOL, HDL, LDLCALC, TRIG, CHOLHDL, LDLDIRECT in the last 72 hours. Thyroid function studies Recent Labs    11/02/17 2337  TSH 4.034   Anemia work up No results  for input(s): VITAMINB12, FOLATE, FERRITIN, TIBC, IRON, RETICCTPCT in the last 72 hours. Urinalysis    Component Value Date/Time   COLORURINE YELLOW 05/04/2017 1203   APPEARANCEUR CLEAR 05/04/2017 1203   LABSPEC 1.010 05/04/2017 1203   PHURINE 6.0 05/04/2017 1203   GLUCOSEU NEGATIVE 05/04/2017 1203   HGBUR NEGATIVE 05/04/2017 1203   BILIRUBINUR NEGATIVE 05/04/2017 1203   KETONESUR 5 (A) 05/04/2017 1203   PROTEINUR NEGATIVE 05/04/2017 1203   UROBILINOGEN 0.2 12/22/2014 0903   NITRITE NEGATIVE 05/04/2017 1203   LEUKOCYTESUR NEGATIVE 05/04/2017 1203    Microbiology Recent Results (from the past 240 hour(s))  Blood culture (routine x 2)     Status: None (Preliminary result)   Collection Time: 11/02/17 11:37 PM  Result Value Ref Range Status   Specimen Description BLOOD LEFT HAND  Final   Special Requests IN PEDIATRIC BOTTLE Blood Culture adequate volume  Final   Culture   Final    NO GROWTH 1 DAY Performed at [facility], [address] [place], [place] [address]    Report Status PENDING  Incomplete  Blood culture (routine x 2)     Status: None (Preliminary result)   Collection Time: 11/02/17 11:37 PM  Result Value  Ref Range Status   Specimen Description BLOOD RIGHT HAND  Final   Special Requests IN PEDIATRIC BOTTLE Blood Culture adequate volume  Final   Culture   Final    NO GROWTH 1 DAY Performed at [facility], [address] [place], [place] [address]    Report Status PENDING  Incomplete  Respiratory Panel by PCR     Status: None   Collection Time: 11/03/17  3:00 AM  Result Value Ref Range Status   Adenovirus NOT DETECTED NOT DETECTED Final   Coronavirus 229E NOT DETECTED NOT DETECTED Final   Coronavirus HKU1 NOT DETECTED NOT DETECTED Final   Coronavirus NL63 NOT DETECTED NOT DETECTED Final   Coronavirus OC43 NOT DETECTED NOT DETECTED Final   Metapneumovirus NOT DETECTED NOT DETECTED Final   Rhinovirus / Enterovirus NOT DETECTED NOT DETECTED Final    Influenza A NOT DETECTED NOT DETECTED Final   Influenza B NOT DETECTED NOT DETECTED Final   Parainfluenza Virus 1 NOT DETECTED NOT DETECTED Final   Parainfluenza Virus 2 NOT DETECTED NOT DETECTED Final   Parainfluenza Virus 3 NOT DETECTED NOT DETECTED Final   Parainfluenza Virus 4 NOT DETECTED NOT DETECTED Final   Respiratory Syncytial Virus NOT DETECTED NOT DETECTED Final   Bordetella pertussis NOT DETECTED NOT DETECTED Final   Chlamydophila pneumoniae NOT DETECTED NOT DETECTED Final   Mycoplasma pneumoniae NOT DETECTED NOT DETECTED Final    Comment: Performed at [facility], [address], [place], [place] [address]    Time coordinating discharge: Approximately 40 minutes  Hazeline Junker, MD  [facility] 11/05/2017, 9:01 AM Pager [phone]

## 2017-11-05 NOTE — Discharge Planning (Signed)
Dr. Text to inform of family and hospice home eager for DC time.  Asked to assess when able.

## 2017-11-05 NOTE — Discharge Planning (Signed)
EMS has arrived. Zack SealGwendalyn Anes notified, per family request, of EMS arrival and also informed patient going to room 103.

## 2017-11-05 NOTE — Discharge Planning (Signed)
Patient IV and tele removed. Discharge papers printed and placed in facility packet with needed paperwork from SW. [facility] Eston EstersJennifer Hogan, RN. Jodie Echevariaan, RN (hospice admission coordinator) also notified that paperwork is complete and EMS has been contacted to transport to room 103. Waiting on arrival.

## 2017-11-08 LAB — CULTURE, BLOOD (ROUTINE X 2)
CULTURE: NO GROWTH
Culture: NO GROWTH
SPECIAL REQUESTS: ADEQUATE
Special Requests: ADEQUATE

## 2017-11-22 DEATH — deceased

## 2018-05-21 IMAGING — CT CT HEAD W/O CM
3 of 4 series · 15 of 47 positions shown, 18 images · non-contrast
Comparison: 10/08/2016

CLINICAL DATA: Confusion for 2 days

EXAM:
CT HEAD WITHOUT CONTRAST
TECHNIQUE: Contiguous axial images were obtained from the base of the skull
through the vertex without intravenous contrast.

[Series 4: head 2.0 h70h · axial · 0.43mm/px · z∈[-116,+10]mm · 9 of 79 slices shown, 12 images]
[im 8/79  brain]
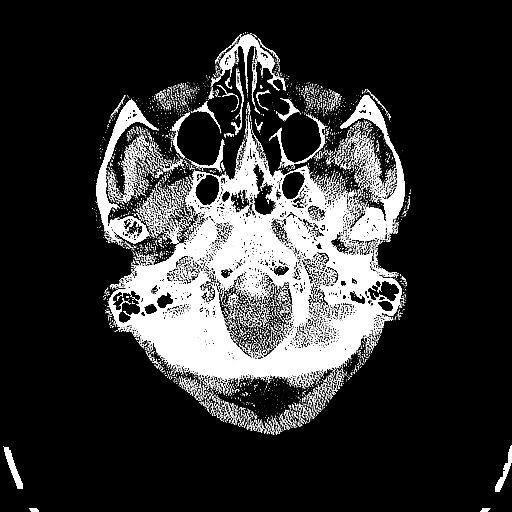
[im 8/79  bone]
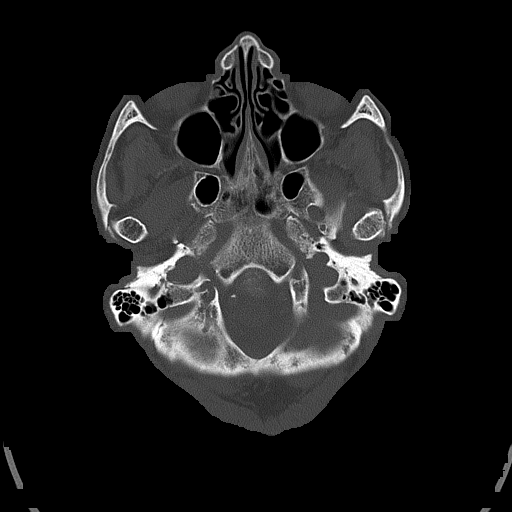
[im 16/79  brain]
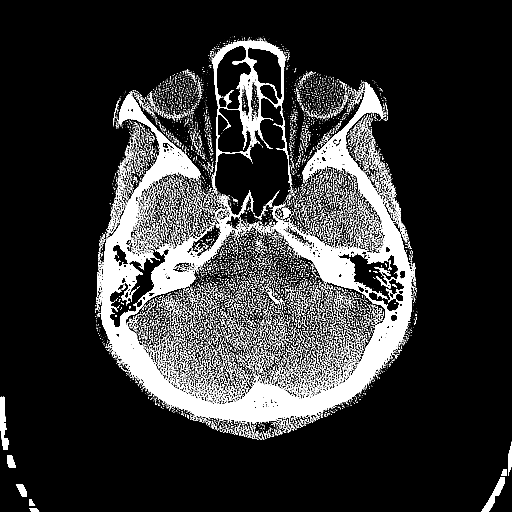
[im 24/79  brain]
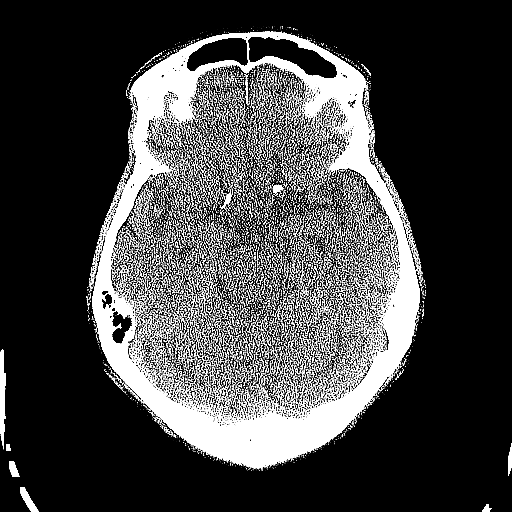
[im 32/79  brain]
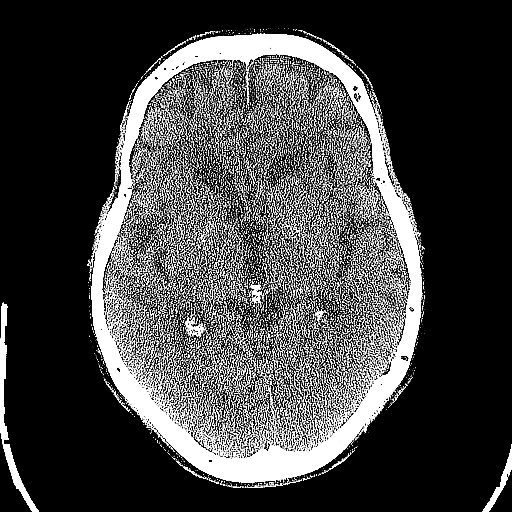
[im 40/79  brain]
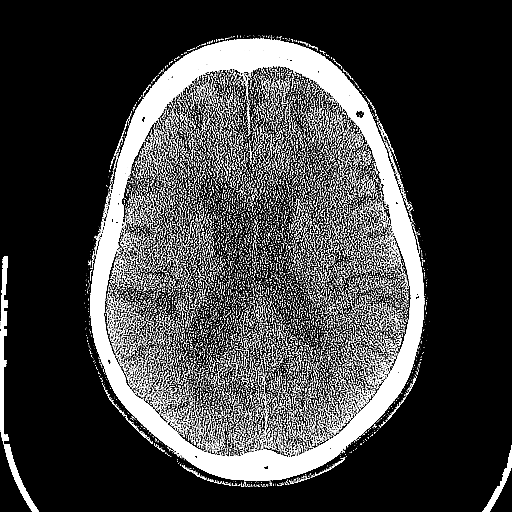
[im 40/79  bone]
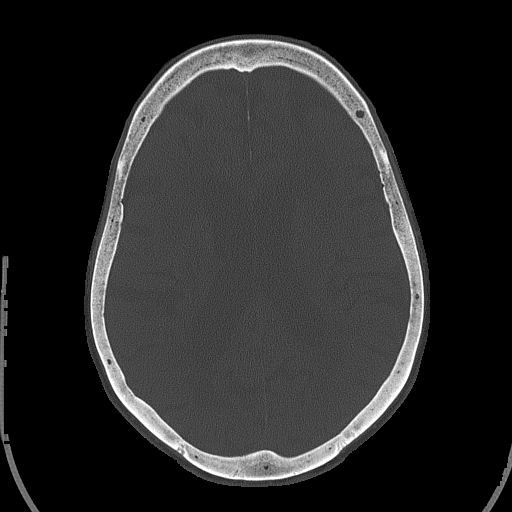
[im 47/79  brain]
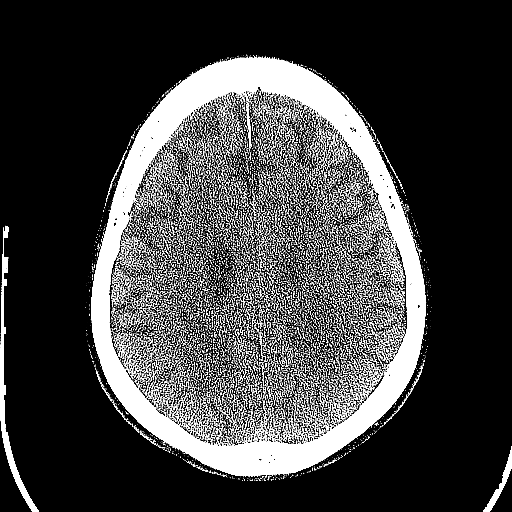
[im 55/79  brain]
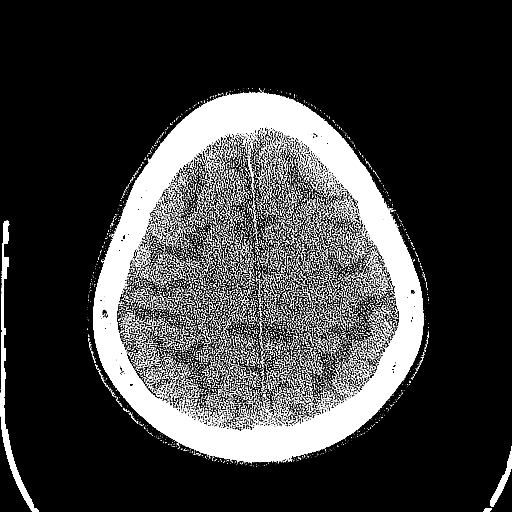
[im 63/79  brain]
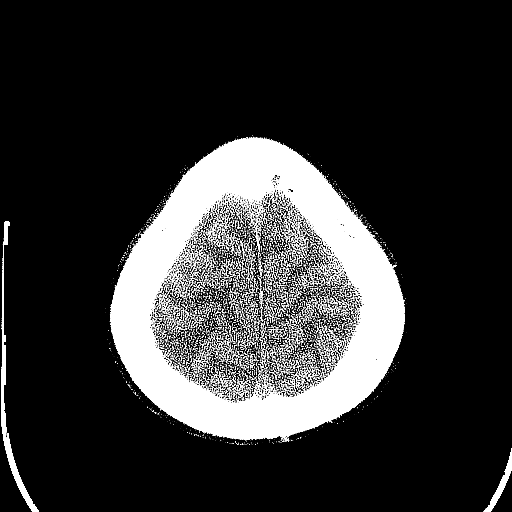
[im 71/79  brain]
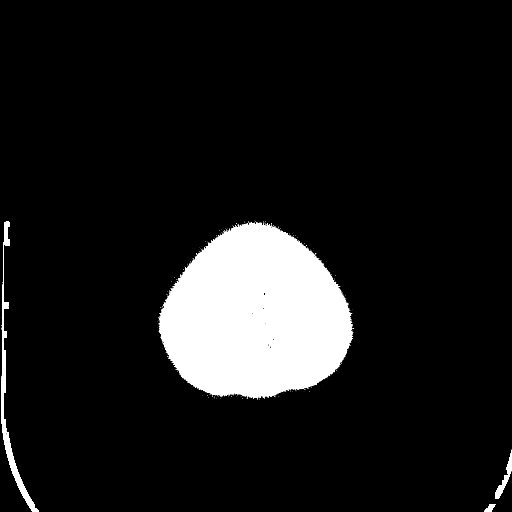
[im 71/79  bone]
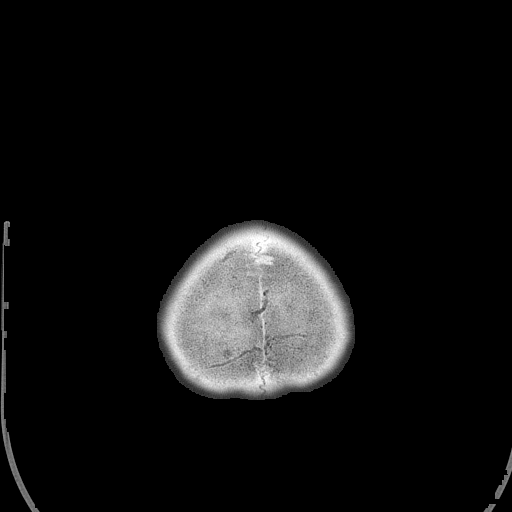

[Series 5: head 3.0 mpr cor · coronal · 0.31mm/px · 3 of 70 slices shown]
[im 24/70  brain]
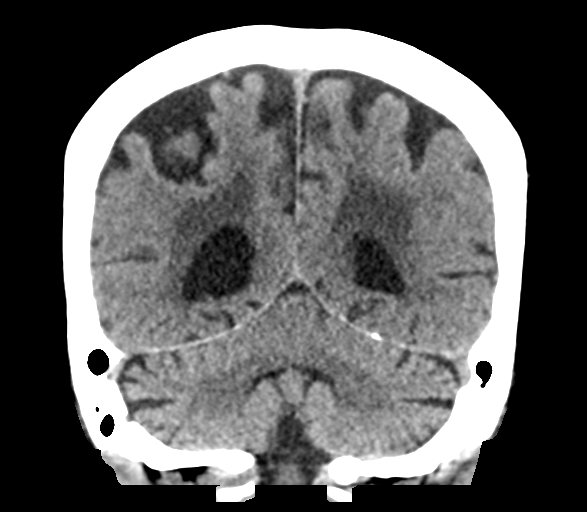
[im 31/70  brain]
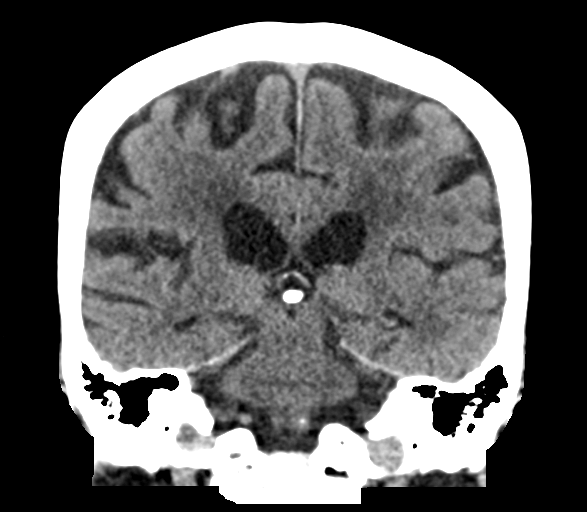
[im 39/70  brain]
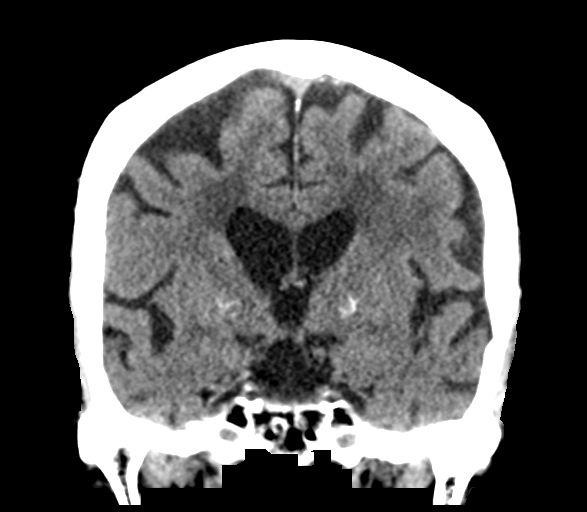

[Series 6: head 3.0 mpr sag · sagittal · 0.31mm/px · 3 of 56 slices shown]
[im 19/56  brain]
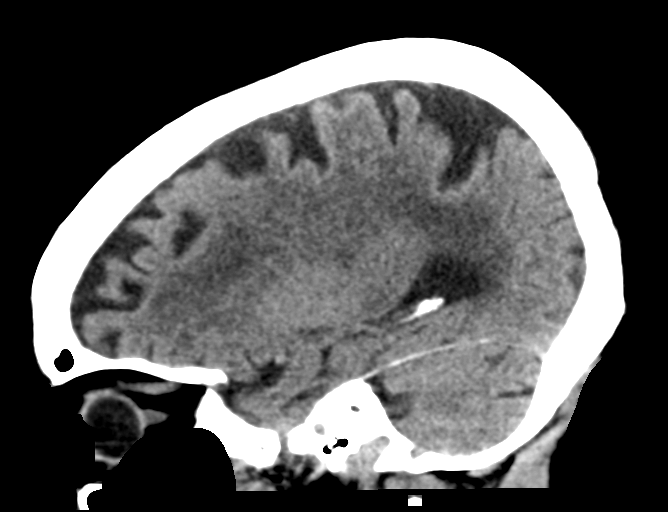
[im 28/56  brain]
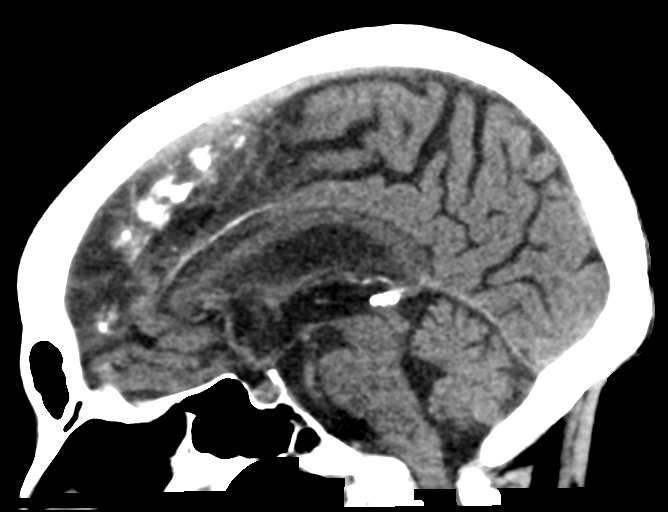
[im 37/56  brain]
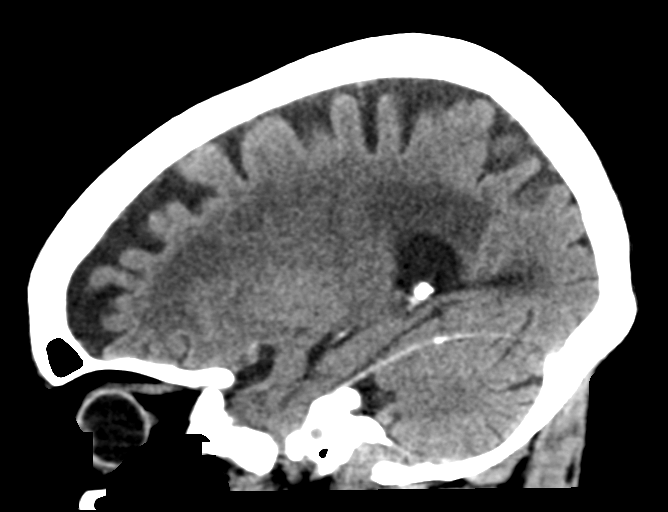

[15 of 47 positions shown; findings below may reference images not displayed]

FINDINGS: Brain: No intracranial hemorrhage, mass effect or midline shift.
Stable atrophy. Again noted periventricular and patchy subcortical
white matter decreased attenuation consistent with chronic small
vessel ischemic changes. No acute cortical infarction. No mass
lesion is noted on this unenhanced scan.

Vascular: Atherosclerotic calcifications bilateral vertebral
arteries. Atherosclerotic calcifications of carotid siphon.

Skull: No skull fracture.

Sinuses/Orbits: No acute findings.

Other: None
IMPRESSION: No acute intracranial abnormality. Stable atrophy and chronic white
matter disease. No definite acute cortical infarction.

## 2018-05-21 IMAGING — DX DG CHEST 2V
2 series · 2 of 2 positions shown · non-contrast
Comparison: 10/11/16

CLINICAL DATA: Confusion and disorientation.

EXAM:
CHEST  2 VIEW

[chest pa]
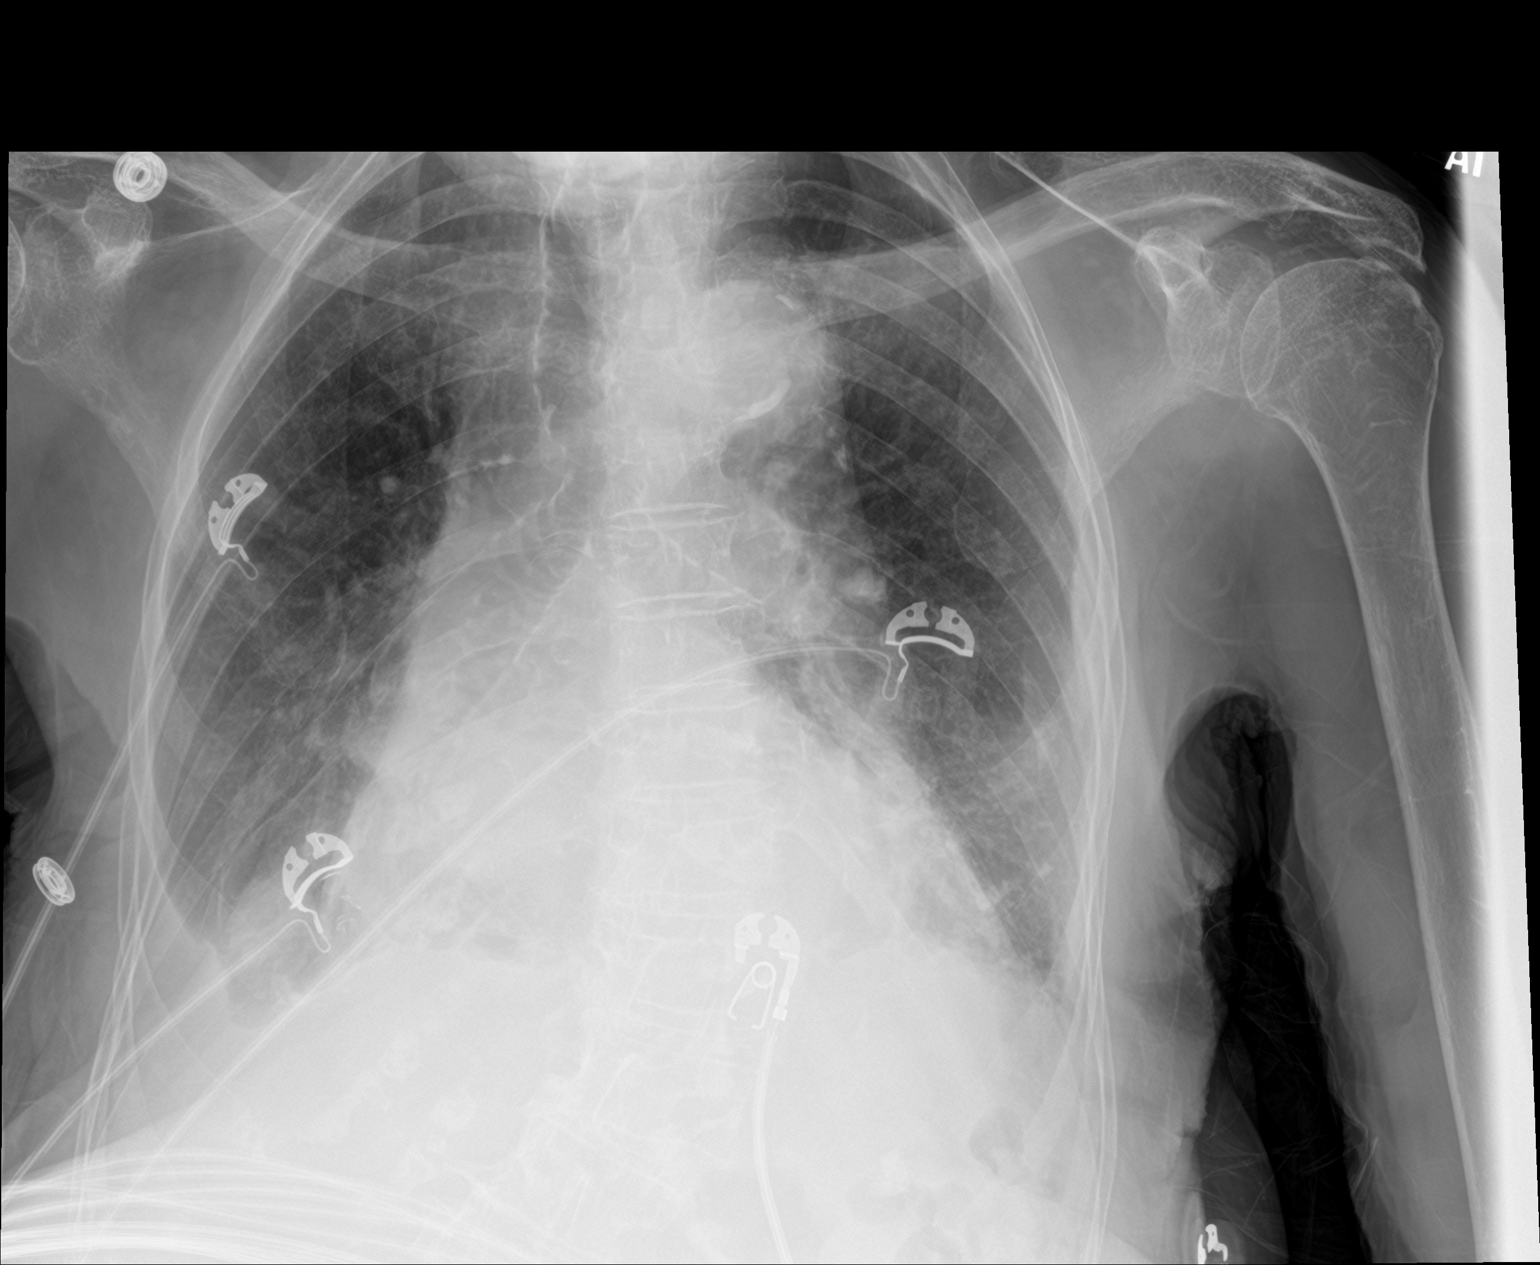

[chest lat]
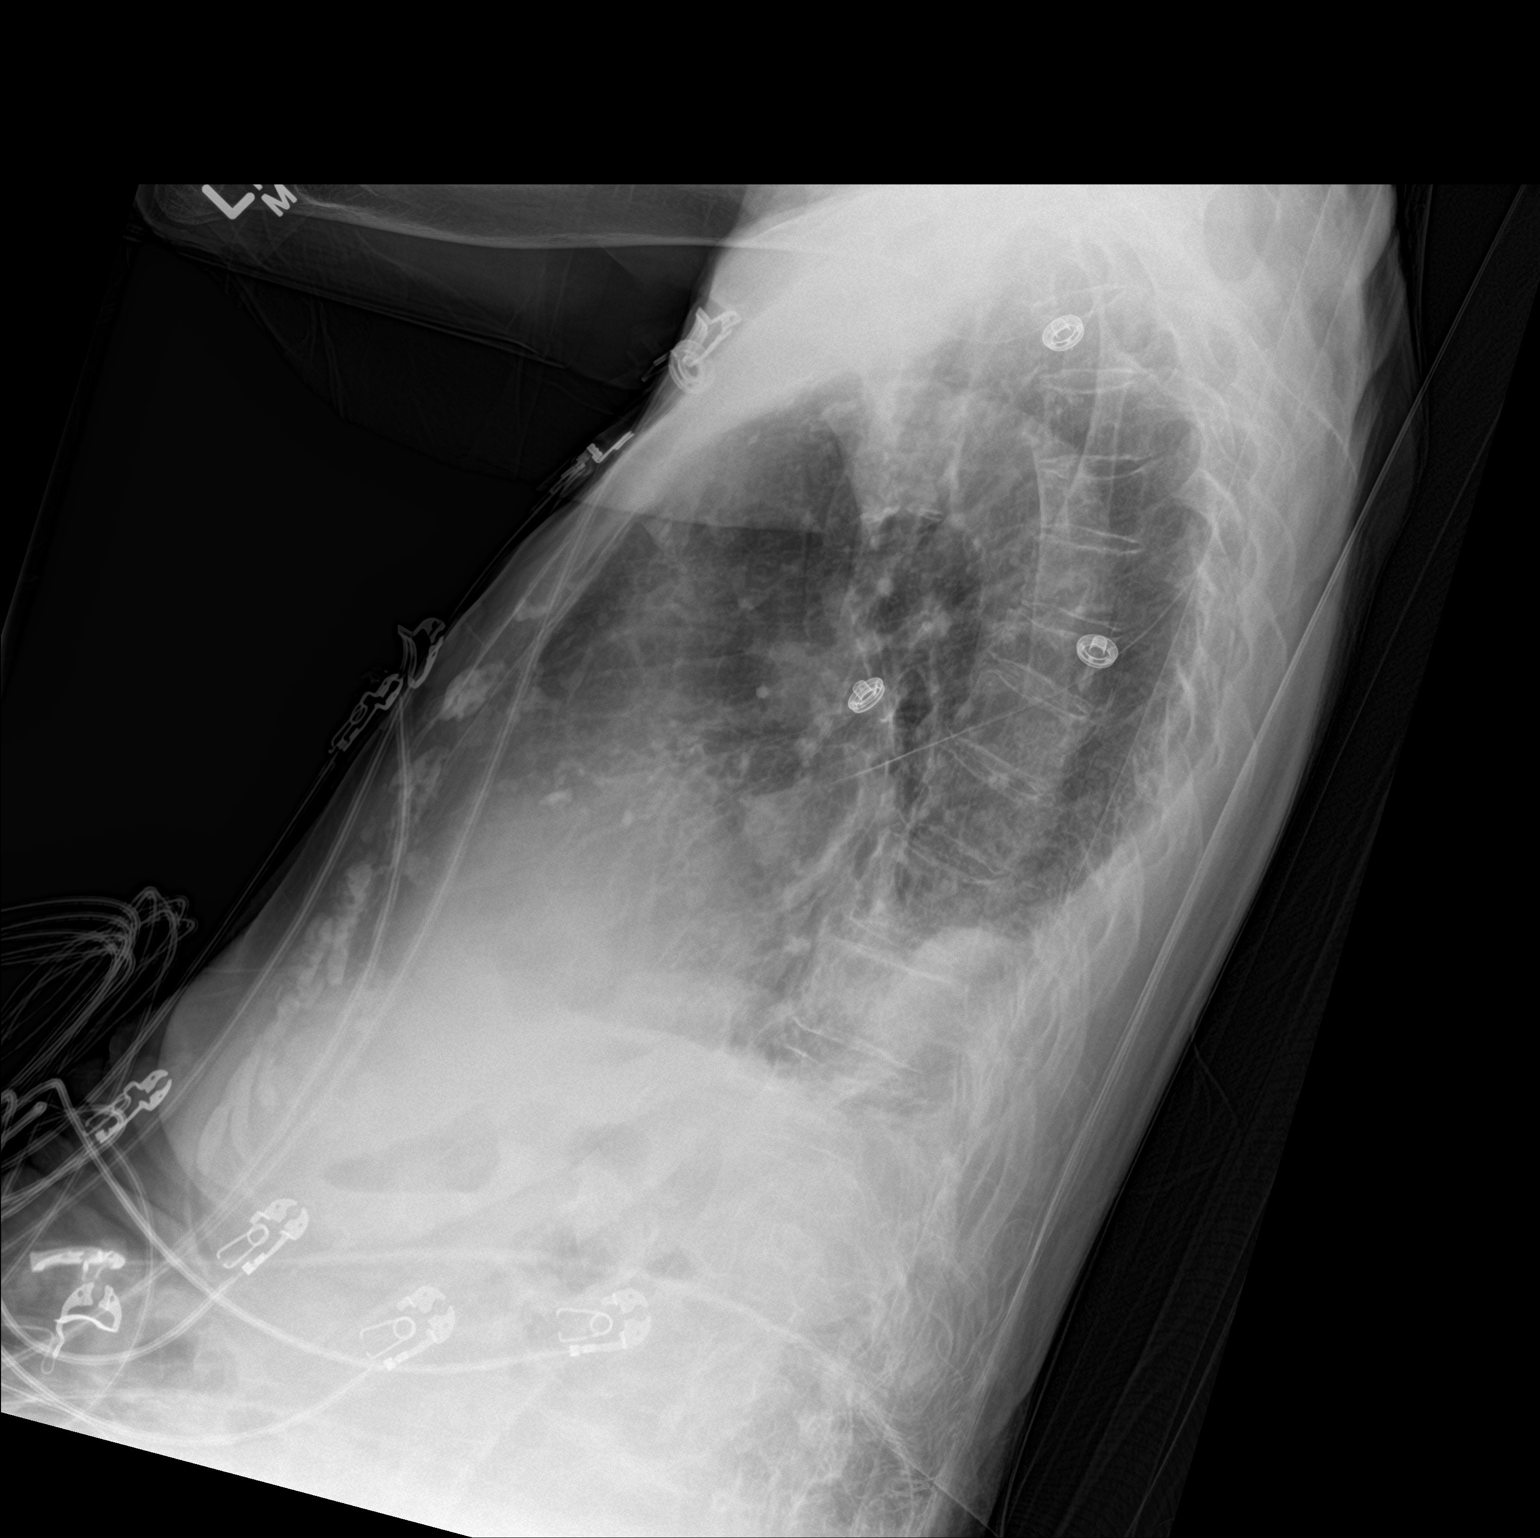

[2 of 2 positions shown; findings below may reference images not displayed]

FINDINGS: There is moderate cardiac enlargement. Aortic atherosclerosis. There
are moderate bilateral pleural effusions identified. Mild
interstitial edema. Aortic atherosclerosis noted.
IMPRESSION: 1. Moderate bilateral pleural effusions are identified.
